# Patient Record
Sex: Male | Born: 1950 | Race: White | Hispanic: No | Marital: Married | Smoking: Former smoker
Health system: Southern US, Community
[De-identification: ages and names within clinical notes are randomized; demographics above are authoritative.]

## PROBLEM LIST (undated history)

## (undated) DIAGNOSIS — I10 Essential (primary) hypertension: Secondary | ICD-10-CM

## (undated) DIAGNOSIS — M5136 Other intervertebral disc degeneration, lumbar region: Secondary | ICD-10-CM

## (undated) DIAGNOSIS — C4490 Unspecified malignant neoplasm of skin, unspecified: Secondary | ICD-10-CM

## (undated) DIAGNOSIS — Z944 Liver transplant status: Secondary | ICD-10-CM

## (undated) DIAGNOSIS — M1990 Unspecified osteoarthritis, unspecified site: Secondary | ICD-10-CM

## (undated) DIAGNOSIS — M51369 Other intervertebral disc degeneration, lumbar region without mention of lumbar back pain or lower extremity pain: Secondary | ICD-10-CM

## (undated) HISTORY — PX: OTHER SURGICAL HISTORY: SHX169

---

## 1997-05-26 ENCOUNTER — Emergency Department (HOSPITAL_COMMUNITY): Admission: EM | Admit: 1997-05-26 | Discharge: 1997-05-26 | Payer: Self-pay | Admitting: Emergency Medicine

## 1997-08-20 ENCOUNTER — Ambulatory Visit (HOSPITAL_COMMUNITY): Admission: RE | Admit: 1997-08-20 | Discharge: 1997-08-20 | Payer: Self-pay | Admitting: Gastroenterology

## 1999-04-07 ENCOUNTER — Encounter: Payer: Self-pay | Admitting: Gastroenterology

## 1999-04-07 ENCOUNTER — Ambulatory Visit (HOSPITAL_COMMUNITY): Admission: RE | Admit: 1999-04-07 | Discharge: 1999-04-07 | Payer: Self-pay | Admitting: Gastroenterology

## 2000-08-17 ENCOUNTER — Ambulatory Visit (HOSPITAL_COMMUNITY): Admission: RE | Admit: 2000-08-17 | Discharge: 2000-08-17 | Payer: Self-pay | Admitting: Gastroenterology

## 2000-08-17 ENCOUNTER — Encounter: Payer: Self-pay | Admitting: Gastroenterology

## 2002-06-12 ENCOUNTER — Encounter: Payer: Self-pay | Admitting: Rheumatology

## 2002-06-12 ENCOUNTER — Encounter: Admission: RE | Admit: 2002-06-12 | Discharge: 2002-06-12 | Payer: Self-pay | Admitting: Rheumatology

## 2002-11-15 ENCOUNTER — Encounter: Admission: RE | Admit: 2002-11-15 | Discharge: 2002-11-15 | Payer: Self-pay | Admitting: Rheumatology

## 2002-11-15 ENCOUNTER — Encounter: Payer: Self-pay | Admitting: Rheumatology

## 2002-11-23 ENCOUNTER — Encounter: Admission: RE | Admit: 2002-11-23 | Discharge: 2002-11-23 | Payer: Self-pay | Admitting: Rheumatology

## 2002-11-23 ENCOUNTER — Encounter: Payer: Self-pay | Admitting: Rheumatology

## 2002-12-04 ENCOUNTER — Encounter: Admission: RE | Admit: 2002-12-04 | Discharge: 2002-12-04 | Payer: Self-pay | Admitting: Rheumatology

## 2003-02-07 ENCOUNTER — Encounter: Admission: RE | Admit: 2003-02-07 | Discharge: 2003-02-07 | Payer: Self-pay | Admitting: Internal Medicine

## 2006-06-12 ENCOUNTER — Inpatient Hospital Stay (HOSPITAL_COMMUNITY): Admission: EM | Admit: 2006-06-12 | Discharge: 2006-06-14 | Payer: Self-pay | Admitting: Emergency Medicine

## 2006-06-13 ENCOUNTER — Encounter (INDEPENDENT_AMBULATORY_CARE_PROVIDER_SITE_OTHER): Payer: Self-pay | Admitting: *Deleted

## 2008-01-12 IMAGING — CR DG CHEST 2V
2 series · 2 of 2 positions shown · non-contrast
Comparison: None.

CLINICAL DATA: Transient ischemic attack.  
 CHEST - 2 VIEW:
 PA and lateral chest - 06/13/06.

[w chest pa]
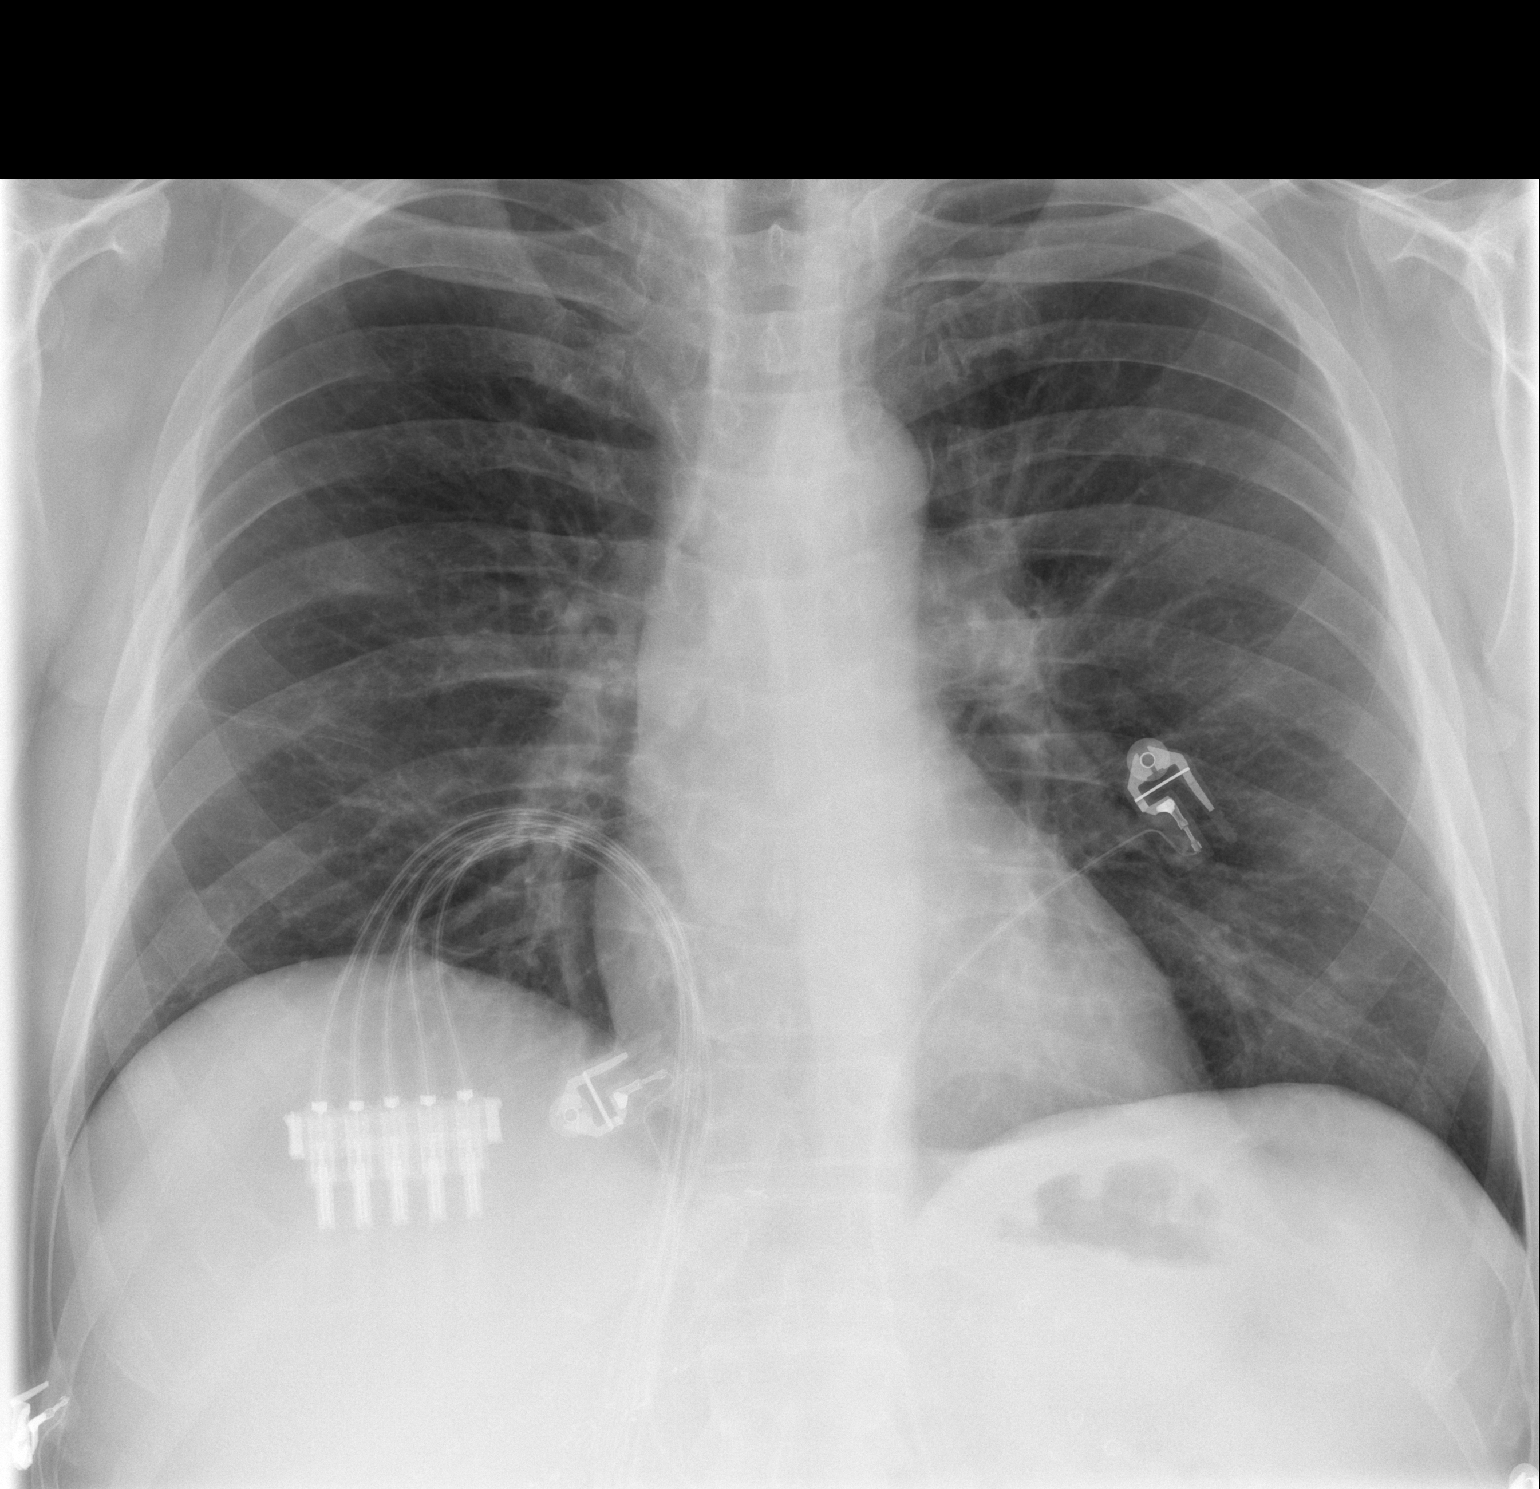

[w chest lat]
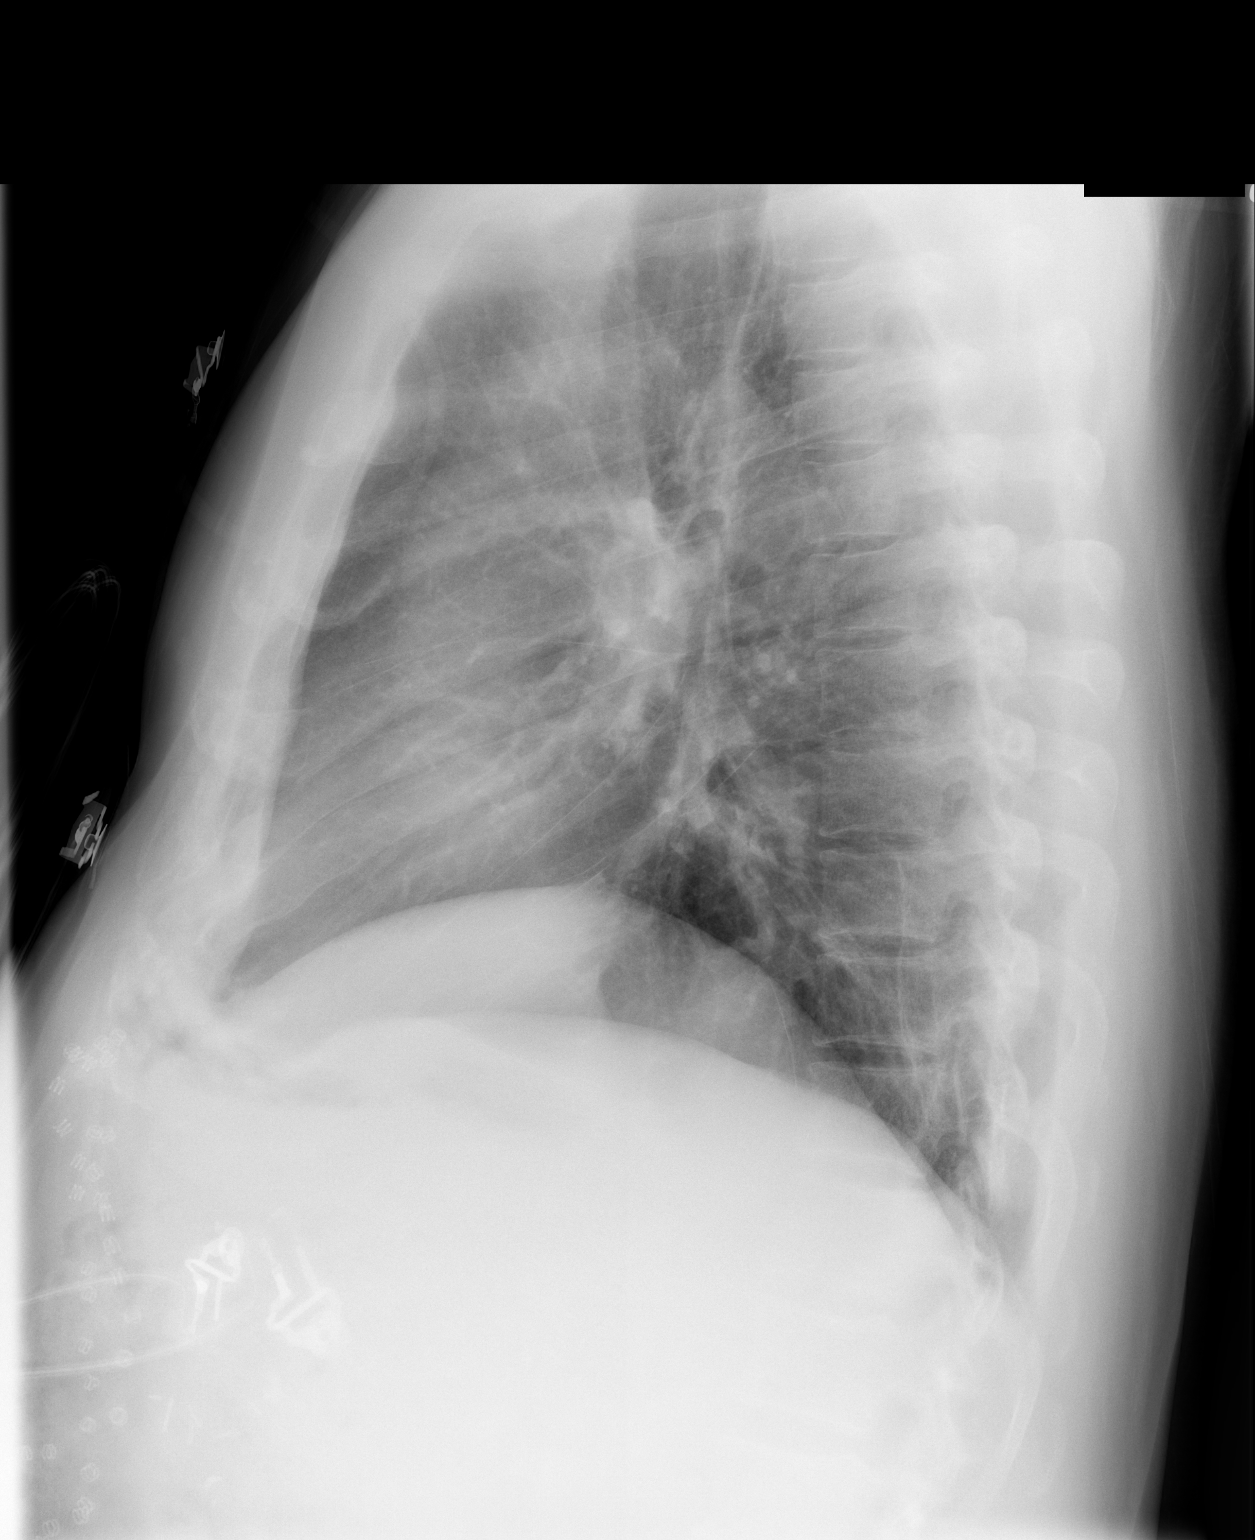

[2 of 2 positions shown; findings below may reference images not displayed]

FINDINGS: The lungs are clear.   The costophrenic angles are sharp. The cardiac and mediastinal contours are unremarkable.  No focal bony abnormality with a remote right ninth rib fracture noted.
IMPRESSION: No acute finding.

## 2010-06-15 NOTE — Discharge Summary (Signed)
NAME:  Andrew Banks, Andrew Banks             ACCOUNT NO.:  [id number]   MEDICAL RECORD NO.:  [id number]          PATIENT TYPE:  INP   LOCATION:  3735                         FACILITY:  [facility]   PHYSICIAN:  Pramod P. Pearlean Brownie, MD    DATE OF BIRTH:  September 09, 1950   DATE OF ADMISSION:  06/12/2006  DATE OF DISCHARGE:  06/14/2006                               DISCHARGE SUMMARY   DIAGNOSES:  At the time of discharge:  1. Right brain transient ischemic attack.  2. History of hepatic transplant.  The liver transplant was 6 years      ago.  3. History of Hepatitis C.  4. Cigarette smoker.  5. History of cholecystectomy.  6. Hepatitis C with end-stage liver disease, prior to liver      transplant.  7. History of left shoulder surgery.  8. History of knee surgery and elbow surgery, due to injuries during      [place] war.  9. Pyelonephritis.  10.History of hernia surgery.  11.Hypertension.   MEDICINES AT TIME OF DISCHARGE:  1. Aspirin 81 mg a day.  2. Norvasc 10 mg a day.  3. Norvasc 5 mg at bedtime.  4. Actonel 300 mg a day.  5. Neurontin 300 mg b.i.d.  6. Paxil 40 mg a day.  7. Inderal 60 mg LA a day.  8. Prevacid 30 mg b.i.d.  9. Multivitamin a day.  10.Prograf 2 mg a day, 1 mg at bedtime.  11.Cerefolin one a day.  12.Calcium plus vitamin D, one a day.   STUDIES PERFORMED:  1. EKG on admission shows normal sinus rhythm with incomplete right      bundle branch block.  A CT of the brain on admission shows no acute      intracranial abnormality.  Atrophy.  2. Chest X-Ray: Shows no acute finding.  3. MRI of the brain shows mild atrophy, no acute abnormality.  No      significant prior ischemia next.  4. MRA of the neck shows no significant carotid stenosis, question      stenosis of proximal right vertebral artery but likely artifact.  5. MRA of the head normal.  6. Carotid Doppler shows no ICA stenosis and 2-D echocardiogram shows      EF of 60% with no source of embolus.   LABORATORY  STUDIES:  Shows CBC with hematocrit 38.0, but hemoglobin  13.2, platelets 114, otherwise normal.  Chemistries with sodium 133,  glucose 108, otherwise normal.  Coagulation studies normal.  Liver  function tests with alkaline phosphate 213, otherwise normal.  Albumin  4.  Cardiac enzymes negative.  Cholesterol 135, triglycerides 143, HDL  31, LDL 75.  Urinalysis was negative.  Homocystine was 43.8.  Alcohol  level less than 5.  Hemoglobin A1c is 4.9.  Urine drug screen was  negative.   HISTORY OF PRESENT ILLNESS:  Andrew Banks is a 60 year old left  handed Caucasian male with history of hypertension and endstage liver  disease, status post liver transplant 6 years ago.  He presented to  [facility] the evening of admission, after onset of left-sided  hemi-sensory deficit that began around 6:30 p.m.  The patient initially  noticed onset of some cold sensation on left part of the face.  He felt  dizzy,  the day prior.  He also noticed onset of some white noise  hearing changes in left ear that occurred at the onset of the hemi-  sensory deficit.  The patient began having some numb sensations, cold  sensations of the left arm and the leg and may have been slightly weak  on the left side.  He denied any visual abnormalities.  He did not have  a headache but later developed a slight headache.  He has had some  improvement and left-sided deficit.  He has minimal residual, but for  the most part, a sensory deficit is improved.  CT of the brain is  unremarkable.  NIH stroke scale was 1.  He was admitted for further  stroke evaluation.  He was not a TPA candidate, secondary to rapid  improvement.   HOSPITAL COURSE:  MRI was negative for acute infarct.  It is felt that  he possibly had a right brain TIA, as no acute stroke was seen.  It was  hard to be absolutely certain.  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  The patient was advised to stop  smoking.  He was started on aspirin for stroke prevention.  He had no  neurologic deficits and was safe for return home.   CONDITION ON DISCHARGE:  The patient was alert and oriented x3.  No  aphasia.  No dysarthria.  Extraocular movements are intact.  Face is  symmetric.  He has no focal deficits.  No arm or leg weakness.  Gait is  steady.   DISCHARGE/PLAN:  1. Discharge home with family.  2. Aspirin for stroke prevention.  3. Stop smoking.  4. Cerefolin to decrease homocystine.   FOLLOW UP:  1. Dr. Wylene Simmer in one 1 week.  2. Follow up with Dr. Anne Hahn as needed.      Annie Main, N.P.    ______________________________  Sunny Schlein. Pearlean Brownie, MD    SB/MEDQ  D:  06/14/2006  T:  06/14/2006  Job:  [id number]   cc:   Pramod P. Pearlean Brownie, MD  Gaspar Garbe, M.D.

## 2010-06-15 NOTE — H&P (Signed)
NAME:  DAYSHAWN, IRIZARRY NO.:  [id number]   MEDICAL RECORD NO.:  [id number]          PATIENT TYPE:  EMS   LOCATION:  MAJO                         FACILITY:  [facility]   PHYSICIAN:  Marlan Palau, M.D.  DATE OF BIRTH:  Jan 23, 1951   DATE OF ADMISSION:  06/12/2006  DATE OF DISCHARGE:                              HISTORY & PHYSICAL   HISTORY OF PRESENT ILLNESS:  Shykeem L. Petrak is a 60 year old left-  handed white male born Apr 05, 1950 with a history of hypertension  and end-stage liver status post liver transplant 6 years ago.  This  patient comes to [facility] this evening after onset of left-  sided hemisensory deficit that began around 6:30 p.m.  The patient  initially noted onset of some cold sensation in the left part of the  face, had felt dizzy yesterday.  The patient also had noted onset of  some white noise hearing changes in the left ear that occurred at the  onset of this hemisensory deficit.  The patient began having some numb  sensations, cold sensations, of the left arm and the leg and may have  been slightly weak on that left side.  The patient denied any visual  field changes initially.  He did not have a headache but later developed  a slight headache.  The patient has had improvement in the deficit.  Has  minimal residual but for the most part the sensory deficit is improved.  CT scan of the head has been done and is unremarkable.  Neurology is  called as a code stroke for evaluation.  NIH Stroke Scale score is 1.   PAST MEDICAL HISTORY:  1. New onset of left hemisensory deficit.  2. Status post liver transplant 6 years ago.  3. Gallbladder resection in the past.  4. Hepatitic C with end-stage liver prior to the liver transplant.  5. Left shoulder surgery.  6. Knee surgery and elbow surgery due to injuries during the [place]      War.  7. Pyelonephritis.  8. Hernia surgery.  9. Hypertension.   MEDICATIONS:  1. Tacrolimus 1 mg  tablet, 2 in the morning, 1 in the evening.  2. Norvasc 10 mg tablets, 1 in the morning, 1/2 in the evening.  3. Ursodiol 300 mg daily.  4. Neurontin 300 mg b.i.d.  5. Paxil 40 mg daily.  6. Prevacid 30 mg b.i.d.  7. Calcium supplementation.  8. Multivitamins.  9. Inderal 60 mg LA tablet daily.   ALLERGIES:  The patient has no known allergies.   SOCIAL HISTORY:  Smokes 1/4 pack of cigarettes daily and does not drink  alcohol.  This patient is married.  Lives in the [place], [place] area.  He is retired.  The patient has 3 children who are alive  and well.   FAMILY MEDICAL HISTORY:  The mother is alive and has a history of breast  cancer.  Father is alive, has history of hypertension, prostate cancer,  glaucoma.  One sister who is in good health with the exception of  obesity.   REVIEW  OF SYSTEMS:  Notable for no recent fevers or chills.  The patient  had some slight dizziness over the last day or so.  Slight shortness of  breath for several days.  Denies any chest pain, abdominal pain, nausea,  vomiting, gait disturbance, troubles controlling the bowels or bladder,  blacking out episodes.   PHYSICAL EXAMINATION:  VITAL SIGNS:  Blood pressure is 146/85, heart  rate is 67, respiratory rate 20, temperature afebrile.  GENERAL:  This patient is a fairly well-developed white male who is  alert and cooperative at the time of examination.  HEENT:  Head is atraumatic.  Eyes - pupils equal, round and reactive to  light.  Discs are flat bilaterally.  NECK:  Supple.  No carotid bruits noted.  RESPIRATORY:  Examination is clear.  CARDIOVASCULAR:  Examination reveals a regular rate and rhythm.  No  obvious murmurs or rubs noted.  ABDOMEN:  Reveals positive bowel sounds.  No organomegaly or tenderness  noted.  EXTREMITIES:  Without significant edema.  NEUROLOGICAL:  Cranial nerves as above.  Facial symmetry is present.  The patient has good sensation of the face to pinprick,  soft touch  bilaterally.  He has good strength in the facial muscles and the muscles  of the head turn and shoulders bilaterally.  Speech is well enunciated  and not aphasic.  Extraocular movements are full.  Visual fields are  full.  No aphasia noted.  Motor testing reveals 5/5 strength in all  fours.  Good symmetric motor tone is noted throughout.  Sensory testing  is intact to pinprick, soft touch and vibrate sensation throughout with  the exception of some decrease in pinprick sensation of the left foot as  compared to the right.  The patient has good finger-nose-finger, heel-to-  shin.  Gait was not tested.  No drift is seen in the arms or legs.  No  evidence of extinction.  Simultaneous stimulation was noted.  The  patient has good symmetric reflexes throughout.  Toes are neutral  bilaterally.   LABORATORY DATA:  As noted before, an INR of 1.0, sodium 133, potassium  3.9, chloride of 102, CO2 22, glucose of 108, BUN of 14, creatinine  0.98, total bili 1.0, alkaline phosphatase of 213, SGOT of 26, SGPT of  21, total protein of 6.7, albumin of 4.0, calcium 9.1.  White count of  5.7, hemoglobin of 13.2, hematocrit of 38.0, MCV of 90.4, platelets of  114.  CT scan of the head is as above.   IMPRESSION:  Onset of left hemisensory deficit.   The patient has NIH Stroke Scale score of 1.  The patient is not a TPA  candidate due to minimal deficit.  We will admit this patient at this  point for evaluation to rule out cerebrovascular disease.   PLAN:  1. Admission to [facility].  2. Aspirin therapy.  3. MRI of the brain.  4. MRI angiogram of the intracranial and extracranial vessels.  5. 2D echocardiogram.  6. Follow up as his course follows.      Marlan Palau, M.D.  Electronically Signed     CKW/MEDQ  D:  06/12/2006  T:  06/13/2006  Job:  [id number]   cc:   Gaspar Garbe, M.D.  [facility]

## 2014-08-27 DIAGNOSIS — B1920 Unspecified viral hepatitis C without hepatic coma: Secondary | ICD-10-CM | POA: Diagnosis not present

## 2014-08-27 DIAGNOSIS — K769 Liver disease, unspecified: Secondary | ICD-10-CM | POA: Diagnosis not present

## 2014-08-27 DIAGNOSIS — R609 Edema, unspecified: Secondary | ICD-10-CM | POA: Diagnosis not present

## 2014-08-27 DIAGNOSIS — Z79899 Other long term (current) drug therapy: Secondary | ICD-10-CM | POA: Diagnosis not present

## 2014-08-27 DIAGNOSIS — R918 Other nonspecific abnormal finding of lung field: Secondary | ICD-10-CM | POA: Diagnosis not present

## 2014-08-27 DIAGNOSIS — R161 Splenomegaly, not elsewhere classified: Secondary | ICD-10-CM | POA: Diagnosis not present

## 2014-08-27 DIAGNOSIS — Z418 Encounter for other procedures for purposes other than remedying health state: Secondary | ICD-10-CM | POA: Diagnosis not present

## 2014-08-27 DIAGNOSIS — Z114 Encounter for screening for human immunodeficiency virus [HIV]: Secondary | ICD-10-CM | POA: Diagnosis not present

## 2014-08-27 DIAGNOSIS — Z981 Arthrodesis status: Secondary | ICD-10-CM | POA: Diagnosis not present

## 2014-08-27 DIAGNOSIS — K766 Portal hypertension: Secondary | ICD-10-CM | POA: Diagnosis not present

## 2014-08-27 DIAGNOSIS — Z4823 Encounter for aftercare following liver transplant: Secondary | ICD-10-CM | POA: Diagnosis not present

## 2014-08-27 DIAGNOSIS — R634 Abnormal weight loss: Secondary | ICD-10-CM | POA: Diagnosis not present

## 2014-08-27 DIAGNOSIS — I10 Essential (primary) hypertension: Secondary | ICD-10-CM | POA: Diagnosis not present

## 2014-08-27 DIAGNOSIS — N281 Cyst of kidney, acquired: Secondary | ICD-10-CM | POA: Diagnosis not present

## 2014-08-27 DIAGNOSIS — Z9049 Acquired absence of other specified parts of digestive tract: Secondary | ICD-10-CM | POA: Diagnosis not present

## 2014-08-27 DIAGNOSIS — Z944 Liver transplant status: Secondary | ICD-10-CM | POA: Diagnosis not present

## 2014-09-10 DIAGNOSIS — R938 Abnormal findings on diagnostic imaging of other specified body structures: Secondary | ICD-10-CM | POA: Diagnosis not present

## 2014-09-10 DIAGNOSIS — R918 Other nonspecific abnormal finding of lung field: Secondary | ICD-10-CM | POA: Diagnosis not present

## 2014-09-10 DIAGNOSIS — R590 Localized enlarged lymph nodes: Secondary | ICD-10-CM | POA: Diagnosis not present

## 2014-09-10 DIAGNOSIS — Z944 Liver transplant status: Secondary | ICD-10-CM | POA: Diagnosis not present

## 2014-10-14 DIAGNOSIS — K766 Portal hypertension: Secondary | ICD-10-CM | POA: Diagnosis not present

## 2014-10-14 DIAGNOSIS — Z79899 Other long term (current) drug therapy: Secondary | ICD-10-CM | POA: Diagnosis not present

## 2014-10-14 DIAGNOSIS — Z888 Allergy status to other drugs, medicaments and biological substances status: Secondary | ICD-10-CM | POA: Diagnosis not present

## 2014-10-14 DIAGNOSIS — Z944 Liver transplant status: Secondary | ICD-10-CM | POA: Diagnosis not present

## 2014-10-14 DIAGNOSIS — K219 Gastro-esophageal reflux disease without esophagitis: Secondary | ICD-10-CM | POA: Diagnosis not present

## 2014-10-14 DIAGNOSIS — K3189 Other diseases of stomach and duodenum: Secondary | ICD-10-CM | POA: Diagnosis not present

## 2014-10-14 DIAGNOSIS — I10 Essential (primary) hypertension: Secondary | ICD-10-CM | POA: Diagnosis not present

## 2014-10-14 DIAGNOSIS — Z79891 Long term (current) use of opiate analgesic: Secondary | ICD-10-CM | POA: Diagnosis not present

## 2015-03-22 DIAGNOSIS — J40 Bronchitis, not specified as acute or chronic: Secondary | ICD-10-CM | POA: Diagnosis not present

## 2015-03-22 DIAGNOSIS — R05 Cough: Secondary | ICD-10-CM | POA: Diagnosis not present

## 2015-03-22 DIAGNOSIS — R062 Wheezing: Secondary | ICD-10-CM | POA: Diagnosis not present

## 2015-03-24 DIAGNOSIS — R062 Wheezing: Secondary | ICD-10-CM | POA: Diagnosis not present

## 2015-03-24 DIAGNOSIS — R05 Cough: Secondary | ICD-10-CM | POA: Diagnosis not present

## 2015-03-24 DIAGNOSIS — J40 Bronchitis, not specified as acute or chronic: Secondary | ICD-10-CM | POA: Diagnosis not present

## 2015-03-26 DIAGNOSIS — Z886 Allergy status to analgesic agent status: Secondary | ICD-10-CM | POA: Diagnosis not present

## 2015-03-26 DIAGNOSIS — R0600 Dyspnea, unspecified: Secondary | ICD-10-CM | POA: Diagnosis not present

## 2015-03-26 DIAGNOSIS — R062 Wheezing: Secondary | ICD-10-CM | POA: Diagnosis not present

## 2015-03-26 DIAGNOSIS — R05 Cough: Secondary | ICD-10-CM | POA: Diagnosis not present

## 2015-03-26 DIAGNOSIS — R0789 Other chest pain: Secondary | ICD-10-CM | POA: Diagnosis not present

## 2015-03-26 DIAGNOSIS — R51 Headache: Secondary | ICD-10-CM | POA: Diagnosis not present

## 2015-03-26 DIAGNOSIS — Z87891 Personal history of nicotine dependence: Secondary | ICD-10-CM | POA: Diagnosis not present

## 2015-03-26 DIAGNOSIS — Z944 Liver transplant status: Secondary | ICD-10-CM | POA: Diagnosis not present

## 2015-03-26 DIAGNOSIS — Z79899 Other long term (current) drug therapy: Secondary | ICD-10-CM | POA: Diagnosis not present

## 2015-03-26 DIAGNOSIS — R531 Weakness: Secondary | ICD-10-CM | POA: Diagnosis not present

## 2015-03-26 DIAGNOSIS — R0989 Other specified symptoms and signs involving the circulatory and respiratory systems: Secondary | ICD-10-CM | POA: Diagnosis not present

## 2015-03-26 DIAGNOSIS — R5383 Other fatigue: Secondary | ICD-10-CM | POA: Diagnosis not present

## 2015-03-26 DIAGNOSIS — R0602 Shortness of breath: Secondary | ICD-10-CM | POA: Diagnosis not present

## 2015-07-16 DIAGNOSIS — J40 Bronchitis, not specified as acute or chronic: Secondary | ICD-10-CM | POA: Diagnosis not present

## 2015-08-12 DIAGNOSIS — Z944 Liver transplant status: Secondary | ICD-10-CM | POA: Diagnosis not present

## 2015-08-12 DIAGNOSIS — Z4823 Encounter for aftercare following liver transplant: Secondary | ICD-10-CM | POA: Diagnosis not present

## 2015-08-12 DIAGNOSIS — Z79899 Other long term (current) drug therapy: Secondary | ICD-10-CM | POA: Diagnosis not present

## 2015-11-30 ENCOUNTER — Emergency Department (HOSPITAL_BASED_OUTPATIENT_CLINIC_OR_DEPARTMENT_OTHER)
Admission: EM | Admit: 2015-11-30 | Discharge: 2015-11-30 | Disposition: A | Discharge status: Home / Self Care | Payer: Medicare Other | Attending: Emergency Medicine | Admitting: Emergency Medicine

## 2015-11-30 ENCOUNTER — Encounter (HOSPITAL_BASED_OUTPATIENT_CLINIC_OR_DEPARTMENT_OTHER): Payer: Self-pay | Admitting: *Deleted

## 2015-11-30 DIAGNOSIS — I10 Essential (primary) hypertension: Secondary | ICD-10-CM | POA: Insufficient documentation

## 2015-11-30 DIAGNOSIS — Z79899 Other long term (current) drug therapy: Secondary | ICD-10-CM | POA: Diagnosis not present

## 2015-11-30 DIAGNOSIS — S4992XA Unspecified injury of left shoulder and upper arm, initial encounter: Secondary | ICD-10-CM | POA: Diagnosis present

## 2015-11-30 DIAGNOSIS — Y92009 Unspecified place in unspecified non-institutional (private) residence as the place of occurrence of the external cause: Secondary | ICD-10-CM | POA: Insufficient documentation

## 2015-11-30 DIAGNOSIS — Z87891 Personal history of nicotine dependence: Secondary | ICD-10-CM | POA: Insufficient documentation

## 2015-11-30 DIAGNOSIS — Y939 Activity, unspecified: Secondary | ICD-10-CM | POA: Diagnosis not present

## 2015-11-30 DIAGNOSIS — S41112A Laceration without foreign body of left upper arm, initial encounter: Secondary | ICD-10-CM | POA: Diagnosis not present

## 2015-11-30 DIAGNOSIS — W01190A Fall on same level from slipping, tripping and stumbling with subsequent striking against furniture, initial encounter: Secondary | ICD-10-CM | POA: Diagnosis not present

## 2015-11-30 DIAGNOSIS — Z85828 Personal history of other malignant neoplasm of skin: Secondary | ICD-10-CM | POA: Insufficient documentation

## 2015-11-30 DIAGNOSIS — Y999 Unspecified external cause status: Secondary | ICD-10-CM | POA: Diagnosis not present

## 2015-11-30 HISTORY — DX: Essential (primary) hypertension: I10

## 2015-11-30 HISTORY — DX: Unspecified osteoarthritis, unspecified site: M19.90

## 2015-11-30 HISTORY — DX: Unspecified malignant neoplasm of skin, unspecified: C44.90

## 2015-11-30 HISTORY — DX: Liver transplant status: Z94.4

## 2015-11-30 HISTORY — DX: Other intervertebral disc degeneration, lumbar region: M51.36

## 2015-11-30 HISTORY — DX: Other intervertebral disc degeneration, lumbar region without mention of lumbar back pain or lower extremity pain: M51.369

## 2015-11-30 NOTE — ED Triage Notes (Signed)
Pt reports tripping over his deaf dog this morning, his arm caught the carpeting on the stairs, large skin tear noted to let upper arm, small amount of active oozing noted, dsd applied by this rn. Pt denies any head injury or other c/o.

## 2015-11-30 NOTE — ED Provider Notes (Signed)
[name] DEPT [facility] Provider Note   CSN: [id number] Arrival date & time: 11/30/15  [id number]     History   Chief Complaint Chief Complaint  Patient presents with  . Fall  . Skin Problem    HPI Andrew Banks is a 65 y.o. male.  HPI Patient tripped on his dog going down several carpeted stairs. His left arm got abraded in the fall. There was slight bleeding. At home, he and his wife applied some ointment and a dressing. He denies any other associated injuries. Past Medical History:  Diagnosis Date  . Arthritis   . Degenerative disc disease, lumbar   . Hypertension   . Liver transplanted ([place])   . Skin cancer     There are no active problems to display for this patient.   Past Surgical History:  Procedure Laterality Date  . liver transplant         Home Medications    Prior to Admission medications   Medication Sig Start Date End Date Taking? Authorizing Provider  FELODIPINE ER PO Take by mouth.   Yes Historical Provider, MD  furosemide (LASIX) 20 MG tablet Take 20 mg by mouth.   Yes Historical Provider, MD  lactulose (CHRONULAC) 10 GM/15ML solution Take by mouth 3 (three) times daily.   Yes Historical Provider, MD  lansoprazole (PREVACID) 30 MG capsule Take 30 mg by mouth daily at 12 noon.   Yes Historical Provider, MD  MORPHINE SULFATE ER PO Take by mouth.   Yes Historical Provider, MD  PARoxetine (PAXIL) 20 MG tablet Take 20 mg by mouth daily.   Yes Historical Provider, MD  propantheline (PROBANTHINE) 15 MG tablet Take 15 mg by mouth 3 (three) times daily with meals.   Yes Historical Provider, MD  propranolol (INDERAL) 10 MG tablet Take 10 mg by mouth 3 (three) times daily.   Yes Historical Provider, MD  rifaximin (XIFAXAN) 550 MG TABS tablet Take 550 mg by mouth.   Yes Historical Provider, MD  tacrolimus (PROGRAF) 1 MG capsule Take 1 mg by mouth 2 (two) times daily.   Yes Historical Provider, MD  tacrolimus (PROTOPIC) 0.03 % ointment Apply topically 2 (two)  times daily.   Yes Historical Provider, MD  ursodiol (ACTIGALL) 250 MG tablet Take 250 mg by mouth 3 (three) times daily.   Yes Historical Provider, MD    Family History History reviewed. No pertinent family history.  Social History Social History  Substance Use Topics  . Smoking status: Former Research scientist (life sciences)  . Smokeless tobacco: Never Used  . Alcohol use No     Allergies   Review of patient's allergies indicates no known allergies.   Review of Systems Review of Systems Constitutional: No recent fevers or chills Neurologic: No headache, head injury, weakness numbness or tingling  Physical Exam Updated Vital Signs There were no vitals taken for this visit.  Physical Exam  Constitutional: He is oriented to person, place, and time. He appears well-developed and well-nourished. No distress.  HENT:  Head: Normocephalic and atraumatic.  Eyes: EOM are normal.  Pulmonary/Chest: Effort normal.  Musculoskeletal: Normal range of motion.  Normal range of motion of the left upper extremity without deformity. Patient does have a skin tear to the posterior upper arm. This is approximately 8 x 6 cm. No active bleeding.  Neurological: He is alert and oriented to person, place, and time. Coordination normal.  Skin: Skin is warm and dry.     ED Treatments / Results  Labs (all labs ordered  are listed, but only abnormal results are displayed) Labs Reviewed - No data to display  EKG  EKG Interpretation None       Radiology No results found.  Procedures Procedures (including critical care time) Wound care: Wound was cleansed with wound cleanser and normal saline. The skin layer was realigned. Mepitel dressing placed. Medications Ordered in ED Medications - No data to display   Initial Impression / Assessment and Plan / ED Course  I have reviewed the triage vital signs and the nursing notes.  Pertinent labs & imaging results that were available during my care of the patient were  reviewed by me and considered in my medical decision making (see chart for details).  Clinical Course    Final Clinical Impressions(s) / ED Diagnoses   Final diagnoses:  Skin tear of left upper arm without complication, initial encounter  Skin tear is no other associated injuries. Wound management as outlined.   New Prescriptions New Prescriptions   No medications on file     Charlesetta Shanks, MD 11/30/15 [phone]

## 2015-11-30 NOTE — ED Notes (Signed)
Pt given d/c resource guide for f/u

## 2015-12-21 DIAGNOSIS — H40022 Open angle with borderline findings, high risk, left eye: Secondary | ICD-10-CM | POA: Diagnosis not present

## 2016-02-15 DIAGNOSIS — G9340 Encephalopathy, unspecified: Secondary | ICD-10-CM | POA: Diagnosis not present

## 2016-02-15 DIAGNOSIS — I358 Other nonrheumatic aortic valve disorders: Secondary | ICD-10-CM | POA: Diagnosis not present

## 2016-02-15 DIAGNOSIS — Z8739 Personal history of other diseases of the musculoskeletal system and connective tissue: Secondary | ICD-10-CM | POA: Diagnosis not present

## 2016-02-15 DIAGNOSIS — I10 Essential (primary) hypertension: Secondary | ICD-10-CM | POA: Diagnosis not present

## 2016-02-15 DIAGNOSIS — Z23 Encounter for immunization: Secondary | ICD-10-CM | POA: Diagnosis not present

## 2016-02-15 DIAGNOSIS — J449 Chronic obstructive pulmonary disease, unspecified: Secondary | ICD-10-CM | POA: Diagnosis not present

## 2016-02-15 DIAGNOSIS — R748 Abnormal levels of other serum enzymes: Secondary | ICD-10-CM | POA: Diagnosis not present

## 2016-02-15 DIAGNOSIS — I110 Hypertensive heart disease with heart failure: Secondary | ICD-10-CM | POA: Diagnosis not present

## 2016-02-15 DIAGNOSIS — M549 Dorsalgia, unspecified: Secondary | ICD-10-CM | POA: Diagnosis not present

## 2016-02-15 DIAGNOSIS — E871 Hypo-osmolality and hyponatremia: Secondary | ICD-10-CM | POA: Diagnosis not present

## 2016-02-15 DIAGNOSIS — I5030 Unspecified diastolic (congestive) heart failure: Secondary | ICD-10-CM | POA: Diagnosis not present

## 2016-02-15 DIAGNOSIS — Z87891 Personal history of nicotine dependence: Secondary | ICD-10-CM | POA: Diagnosis not present

## 2016-02-15 DIAGNOSIS — Z79899 Other long term (current) drug therapy: Secondary | ICD-10-CM | POA: Diagnosis not present

## 2016-02-15 DIAGNOSIS — R092 Respiratory arrest: Secondary | ICD-10-CM | POA: Diagnosis not present

## 2016-02-15 DIAGNOSIS — K7150 Toxic liver disease with chronic active hepatitis without ascites: Secondary | ICD-10-CM | POA: Diagnosis not present

## 2016-02-15 DIAGNOSIS — R55 Syncope and collapse: Secondary | ICD-10-CM | POA: Diagnosis not present

## 2016-02-15 DIAGNOSIS — R9431 Abnormal electrocardiogram [ECG] [EKG]: Secondary | ICD-10-CM | POA: Diagnosis not present

## 2016-02-15 DIAGNOSIS — E876 Hypokalemia: Secondary | ICD-10-CM | POA: Diagnosis not present

## 2016-02-15 DIAGNOSIS — G894 Chronic pain syndrome: Secondary | ICD-10-CM | POA: Diagnosis not present

## 2016-02-15 DIAGNOSIS — I119 Hypertensive heart disease without heart failure: Secondary | ICD-10-CM | POA: Diagnosis not present

## 2016-02-15 DIAGNOSIS — J8410 Pulmonary fibrosis, unspecified: Secondary | ICD-10-CM | POA: Diagnosis not present

## 2016-02-15 DIAGNOSIS — J441 Chronic obstructive pulmonary disease with (acute) exacerbation: Secondary | ICD-10-CM | POA: Diagnosis not present

## 2016-02-15 DIAGNOSIS — K769 Liver disease, unspecified: Secondary | ICD-10-CM | POA: Diagnosis not present

## 2016-02-15 DIAGNOSIS — J9601 Acute respiratory failure with hypoxia: Secondary | ICD-10-CM | POA: Diagnosis not present

## 2016-02-15 DIAGNOSIS — R079 Chest pain, unspecified: Secondary | ICD-10-CM | POA: Diagnosis not present

## 2016-02-15 DIAGNOSIS — R0602 Shortness of breath: Secondary | ICD-10-CM | POA: Diagnosis not present

## 2016-02-15 DIAGNOSIS — I249 Acute ischemic heart disease, unspecified: Secondary | ICD-10-CM | POA: Diagnosis not present

## 2016-02-15 DIAGNOSIS — J90 Pleural effusion, not elsewhere classified: Secondary | ICD-10-CM | POA: Diagnosis not present

## 2016-02-15 DIAGNOSIS — I21A1 Myocardial infarction type 2: Secondary | ICD-10-CM | POA: Diagnosis not present

## 2016-02-15 DIAGNOSIS — I700 Atherosclerosis of aorta: Secondary | ICD-10-CM | POA: Diagnosis not present

## 2016-02-15 DIAGNOSIS — J9600 Acute respiratory failure, unspecified whether with hypoxia or hypercapnia: Secondary | ICD-10-CM | POA: Diagnosis not present

## 2016-02-15 DIAGNOSIS — Z944 Liver transplant status: Secondary | ICD-10-CM | POA: Diagnosis not present

## 2016-02-15 DIAGNOSIS — D72829 Elevated white blood cell count, unspecified: Secondary | ICD-10-CM | POA: Diagnosis not present

## 2016-02-15 DIAGNOSIS — Z8619 Personal history of other infectious and parasitic diseases: Secondary | ICD-10-CM | POA: Diagnosis not present

## 2016-02-15 DIAGNOSIS — A419 Sepsis, unspecified organism: Secondary | ICD-10-CM | POA: Diagnosis not present

## 2016-02-15 DIAGNOSIS — J9602 Acute respiratory failure with hypercapnia: Secondary | ICD-10-CM | POA: Diagnosis not present

## 2016-02-15 DIAGNOSIS — I4510 Unspecified right bundle-branch block: Secondary | ICD-10-CM | POA: Diagnosis not present

## 2016-03-22 DIAGNOSIS — J449 Chronic obstructive pulmonary disease, unspecified: Secondary | ICD-10-CM | POA: Diagnosis not present

## 2016-04-19 DIAGNOSIS — J449 Chronic obstructive pulmonary disease, unspecified: Secondary | ICD-10-CM | POA: Diagnosis not present

## 2016-05-20 DIAGNOSIS — J449 Chronic obstructive pulmonary disease, unspecified: Secondary | ICD-10-CM | POA: Diagnosis not present

## 2016-06-19 DIAGNOSIS — J449 Chronic obstructive pulmonary disease, unspecified: Secondary | ICD-10-CM | POA: Diagnosis not present

## 2016-07-20 DIAGNOSIS — J449 Chronic obstructive pulmonary disease, unspecified: Secondary | ICD-10-CM | POA: Diagnosis not present

## 2016-08-19 DIAGNOSIS — J449 Chronic obstructive pulmonary disease, unspecified: Secondary | ICD-10-CM | POA: Diagnosis not present

## 2016-09-07 DIAGNOSIS — Z85820 Personal history of malignant melanoma of skin: Secondary | ICD-10-CM | POA: Diagnosis not present

## 2016-09-07 DIAGNOSIS — R092 Respiratory arrest: Secondary | ICD-10-CM | POA: Diagnosis not present

## 2016-09-07 DIAGNOSIS — K219 Gastro-esophageal reflux disease without esophagitis: Secondary | ICD-10-CM | POA: Diagnosis not present

## 2016-09-07 DIAGNOSIS — Z8709 Personal history of other diseases of the respiratory system: Secondary | ICD-10-CM | POA: Diagnosis not present

## 2016-09-07 DIAGNOSIS — Z85828 Personal history of other malignant neoplasm of skin: Secondary | ICD-10-CM | POA: Diagnosis not present

## 2016-09-07 DIAGNOSIS — M810 Age-related osteoporosis without current pathological fracture: Secondary | ICD-10-CM | POA: Diagnosis not present

## 2016-09-07 DIAGNOSIS — Z4823 Encounter for aftercare following liver transplant: Secondary | ICD-10-CM | POA: Diagnosis not present

## 2016-09-07 DIAGNOSIS — I10 Essential (primary) hypertension: Secondary | ICD-10-CM | POA: Diagnosis not present

## 2016-09-07 DIAGNOSIS — M519 Unspecified thoracic, thoracolumbar and lumbosacral intervertebral disc disorder: Secondary | ICD-10-CM | POA: Diagnosis not present

## 2016-09-07 DIAGNOSIS — Z8619 Personal history of other infectious and parasitic diseases: Secondary | ICD-10-CM | POA: Diagnosis not present

## 2016-09-07 DIAGNOSIS — Z79899 Other long term (current) drug therapy: Secondary | ICD-10-CM | POA: Diagnosis not present

## 2016-09-07 DIAGNOSIS — Z8719 Personal history of other diseases of the digestive system: Secondary | ICD-10-CM | POA: Diagnosis not present

## 2016-09-07 DIAGNOSIS — J449 Chronic obstructive pulmonary disease, unspecified: Secondary | ICD-10-CM | POA: Diagnosis not present

## 2016-09-19 DIAGNOSIS — J449 Chronic obstructive pulmonary disease, unspecified: Secondary | ICD-10-CM | POA: Diagnosis not present

## 2016-10-20 DIAGNOSIS — J449 Chronic obstructive pulmonary disease, unspecified: Secondary | ICD-10-CM | POA: Diagnosis not present

## 2016-11-19 DIAGNOSIS — J449 Chronic obstructive pulmonary disease, unspecified: Secondary | ICD-10-CM | POA: Diagnosis not present

## 2020-06-02 ENCOUNTER — Other Ambulatory Visit: Payer: Self-pay

## 2020-06-02 ENCOUNTER — Encounter: Payer: Self-pay | Admitting: Physical Therapy

## 2020-06-02 ENCOUNTER — Ambulatory Visit: Payer: No Typology Code available for payment source | Attending: General Practice | Admitting: Physical Therapy

## 2020-06-02 DIAGNOSIS — M6281 Muscle weakness (generalized): Secondary | ICD-10-CM

## 2020-06-02 DIAGNOSIS — M5442 Lumbago with sciatica, left side: Secondary | ICD-10-CM | POA: Diagnosis present

## 2020-06-02 DIAGNOSIS — M25562 Pain in left knee: Secondary | ICD-10-CM | POA: Insufficient documentation

## 2020-06-02 DIAGNOSIS — R262 Difficulty in walking, not elsewhere classified: Secondary | ICD-10-CM

## 2020-06-02 DIAGNOSIS — G8929 Other chronic pain: Secondary | ICD-10-CM | POA: Diagnosis present

## 2020-06-02 DIAGNOSIS — R2689 Other abnormalities of gait and mobility: Secondary | ICD-10-CM
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
## 2020-06-09 ENCOUNTER — Ambulatory Visit: Payer: No Typology Code available for payment source

## 2020-06-10 ENCOUNTER — Ambulatory Visit: Payer: No Typology Code available for payment source | Admitting: Physical Therapy

## 2020-06-15 ENCOUNTER — Other Ambulatory Visit: Payer: Self-pay

## 2020-06-15 ENCOUNTER — Ambulatory Visit: Payer: No Typology Code available for payment source | Admitting: Physical Therapy

## 2020-06-15 ENCOUNTER — Encounter: Payer: Self-pay | Admitting: Physical Therapy

## 2020-06-15 DIAGNOSIS — M25562 Pain in left knee: Secondary | ICD-10-CM

## 2020-06-15 DIAGNOSIS — M6281 Muscle weakness (generalized): Secondary | ICD-10-CM | POA: Diagnosis not present

## 2020-06-15 DIAGNOSIS — G8929 Other chronic pain: Secondary | ICD-10-CM

## 2020-06-15 DIAGNOSIS — R262 Difficulty in walking, not elsewhere classified: Secondary | ICD-10-CM

## 2020-06-15 DIAGNOSIS — R2689 Other abnormalities of gait and mobility: Secondary | ICD-10-CM

## 2020-06-15 NOTE — Therapy (Signed)
[facility] [address]  [place], [place], [address] Phone: [phone]   Fax:  [phone]  Physical Therapy Treatment  Patient Details  Name: Andrew Banks MRN: [id number] Date of Birth: Dec 02, 1950 Referring Provider (PT): Lowella Dandy, MD   Encounter Date: 06/15/2020   PT End of Session - 06/15/20 1530    Visit Number 2    Number of Visits 17    Date for PT Re-Evaluation 07/28/20    Authorization Type Medicare & VA    Authorization - Visit Number 2    Authorization - Number of Visits 15    PT Start Time 1440    PT Stop Time 1530    PT Time Calculation (min) 50 min    Activity Tolerance Patient tolerated treatment well    Behavior During Therapy [facility] for tasks assessed/performed           Past Medical History:  Diagnosis Date  . Arthritis   . Degenerative disc disease, lumbar   . Hypertension   . 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 Skin cancer     Past Surgical History:  Procedure Laterality Date  . liver transplant      There were no vitals filed for this visit.   Subjective Assessment - 06/15/20 1442    Subjective Has had other appointments lately- been getting workup for L foot drop and LE pain with Neurology. Has been walking with his SPC for up to 20 min.    Pertinent History skin CA, liver transplant, HTN, lumbar DDD    Diagnostic tests none recent    Patient Stated Goals decrease pain    Currently in Pain? Yes    Pain Score 6     Pain Location Back    Pain Orientation Lower    Pain Descriptors / Indicators Dull;Constant    Pain Radiating Towards L LE to foot                             [facility] Adult PT Treatment/Exercise - 06/15/20 0001      Ambulation/Gait   Ambulation Distance (Feet) 90 Feet    Gait Pattern Step-through pattern;Step-to pattern;Decreased step length - right;Decreased step length - left;Decreased dorsiflexion - left;Decreased weight shift to  left;Right flexed knee in stance;Left flexed knee in stance;Trunk flexed    Ambulation Surface Level;Indoor    Gait velocity decreased    Gait Comments after 4WW height adjusted- still with anterior trunk lean but improved      Exercises   Exercises Knee/Hip;Lumbar;Ankle      Lumbar Exercises: Aerobic   Nustep L3 x 6 min (UEs/LEs)      Knee/Hip Exercises: Seated   Long Arc Quad Strengthening;Left;1 set;10 reps    Long Arc Quad Limitations yellow loop around ankles   sitting in chair with back rest   Hamstring Curl Strengthening;Left;1 set;10 reps    Hamstring Limitations red TB   cues to decrease speed   Sit to Sand 1 set;10 reps;without UE support   cueing for set up and encouraging anterior trunk lean, reaching for target     Ankle Exercises: Standing   Heel Raises Both;10 reps    Heel Raises Limitations heavy UE support required      Ankle Exercises: Seated   Towel Inversion/Eversion Limitations x15 towel slides; AROM inversion, eversion AAROM    Heel Raises Both;15 reps    Heel Raises Limitations  more success than standing                  PT Education - 06/15/20 1529    Education Details update to HEP; discussion on benefits of aquatic therapy    Person(s) Educated Patient    Methods Explanation;Demonstration;Tactile cues;Verbal cues;Handout    Comprehension Verbalized understanding;Returned demonstration            PT Short Term Goals - 06/15/20 1532      PT SHORT TERM GOAL #1   Title Patient to be independent with initial HEP.    Time 3    Period Weeks    Status On-going    Target Date 06/23/20             PT Long Term Goals - 06/15/20 1532      PT LONG TERM GOAL #1   Title Patient to be independent with advanced HEP.    Time 8    Period Weeks    Status On-going      PT LONG TERM GOAL #2   Title Patient to demonstrate B LE strength >/=4/5.    Time 8    Period Weeks    Status On-going      PT LONG TERM GOAL #3   Title Patient to score  <15 sec on 5xSTS in order to decrease risk of falls.    Time 8    Period Weeks    Status On-going      PT LONG TERM GOAL #4   Title Patient to score <14 sec on TUG with LRAD in order to decrease risk of falls.    Time 8    Period Weeks    Status On-going      PT LONG TERM GOAL #5   Title Patient to score atleast 45/56 on BERG in order to decrease risk of falls.    Time 8    Period Weeks    Status On-going                 Plan - 06/15/20 1530    Clinical Impression Statement Patient arrived to session with report of getting workup for L foot drop and LE pain with Neurology. Notes that he has been ambulating with his SPC for up to 20 minutes at this time. Discussed benefits of aquatic therapy, which patient will likely benefit from d/t chronicity and severity of pain. Will get patient set up for aquatic therapy at [facility] at 1x/week frequency, while maintaining 1x/week frequency here. Adjusted walker height to allow patient to stand taller and improve gait deviations. Worked on STS with cueing for set up and encouraging anterior trunk lean as patient with tendency to lose balance posteriorly. Patient with difficulty completing standing heel raises d/t weakness- better success in sitting. Updated HEP to include sitting rather than standing heel raises- patient reported understanding. Patient without complaints and mentioning improved spirits upon leaving.    Comorbidities skin CA, liver transplant, HTN, lumbar DDD    PT Treatment/Interventions ADLs/Self Care Home Management;Cryotherapy;Electrical Stimulation;Iontophoresis 4mg /ml Dexamethasone;Moist Heat;Balance training;Therapeutic exercise;Therapeutic activities;Functional mobility training;Stair training;Gait training;DME Instruction;Ultrasound;Neuromuscular re-education;Patient/family education;Manual techniques;Vasopneumatic Device;Taping;Energy conservation;Dry needling;Passive range of motion;Scar mobilization    PT Next Visit  Plan assess B hip and knee AROM, BERG; reassess HEP    Consulted and Agree with Plan of Care Patient           Patient will benefit from skilled therapeutic intervention in order to improve the following deficits and impairments:  Abnormal  gait,Hypomobility,Increased edema,Decreased activity tolerance,Decreased strength,Increased fascial restricitons,Pain,Decreased balance,Decreased mobility,Difficulty walking,Increased muscle spasms,Improper body mechanics,Decreased range of motion,Impaired flexibility,Postural dysfunction  Visit Diagnosis: Muscle weakness (generalized)  Chronic left-sided low back pain with left-sided sciatica  Chronic pain of left knee  Difficulty in walking, not elsewhere classified  Other abnormalities of gait and mobility     Problem List There are no problems to display for this patient.    Janene Harvey, PT, DPT 06/15/20 3:33 PM   [facility] [address]  [place], [place], [address] Phone: [phone]   Fax:  [phone]  Name: Andrew Banks MRN: [id number] Date of Birth: 1950/04/22

## 2020-06-17 ENCOUNTER — Other Ambulatory Visit: Payer: Self-pay

## 2020-06-17 ENCOUNTER — Ambulatory Visit: Payer: No Typology Code available for payment source | Admitting: Physical Therapy

## 2020-06-17 ENCOUNTER — Encounter: Payer: Self-pay | Admitting: Physical Therapy

## 2020-06-17 DIAGNOSIS — R2689 Other abnormalities of gait and mobility: Secondary | ICD-10-CM

## 2020-06-17 DIAGNOSIS — M6281 Muscle weakness (generalized): Secondary | ICD-10-CM | POA: Diagnosis not present

## 2020-06-17 DIAGNOSIS — G8929 Other chronic pain: Secondary | ICD-10-CM

## 2020-06-17 DIAGNOSIS — M25562 Pain in left knee: Secondary | ICD-10-CM

## 2020-06-17 DIAGNOSIS — R262 Difficulty in walking, not elsewhere classified: Secondary | ICD-10-CM

## 2020-06-17 DIAGNOSIS — M5442 Lumbago with sciatica, left side: Secondary | ICD-10-CM

## 2020-06-17 NOTE — Therapy (Signed)
[facility] [address]  [place], [place], [address] Phone: [phone]   Fax:  [phone]  Physical Therapy Treatment  Patient Details  Name: Andrew Banks MRN: [id number] Date of Birth: 24-May-1950 Referring Provider (PT): Lowella Dandy, MD   Encounter Date: 06/17/2020   PT End of Session - 06/17/20 1527    Visit Number 3    Number of Visits 17    Date for PT Re-Evaluation 07/28/20    Authorization Type Medicare & VA    Authorization - Visit Number 3    Authorization - Number of Visits 15    PT Start Time 0300    PT Stop Time 1527    PT Time Calculation (min) 42 min    Equipment Utilized During Treatment Gait belt    Activity Tolerance Patient tolerated treatment well;Patient limited by fatigue    Behavior During Therapy WFL for tasks assessed/performed           Past Medical History:  Diagnosis Date  . Arthritis   . Degenerative disc disease, lumbar   . Hypertension   . 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 Skin cancer     Past Surgical History:  Procedure Laterality Date  . liver transplant      There were no vitals filed for this visit.   Subjective Assessment - 06/17/20 1447    Subjective Had some muscle soreness yesterday AM but felt fine by the evening.    Pertinent History skin CA, liver transplant, HTN, lumbar DDD    Diagnostic tests none recent    Patient Stated Goals decrease pain    Currently in Pain? Yes    Pain Score 5     Pain Location Back    Pain Descriptors / Indicators Constant;Dull    Pain Type Chronic pain    Pain Score 7    Pain Location Knee    Pain Orientation Left    Pain Descriptors / Indicators Aching    Pain Type Chronic pain    Pain Score 6    Pain Location Tibia    Pain Orientation Left;Lateral    Pain Descriptors / Indicators Aching    Pain Type Chronic pain              [facility] PT Assessment - 06/17/20 0001      Standardized Balance Assessment    Standardized Balance Assessment Berg Balance Test      Berg Balance Test   Sit to Stand Able to stand without using hands and stabilize independently    Standing Unsupported Able to stand safely 2 minutes    Sitting with Back Unsupported but Feet Supported on Floor or Stool Able to sit safely and securely 2 minutes    Stand to Sit Sits safely with minimal use of hands    Transfers Able to transfer safely, minor use of hands    Standing Unsupported with Eyes Closed Able to stand 10 seconds safely    Standing Unsupported with Feet Together Able to place feet together independently and stand 1 minute safely    From Standing, Reach Forward with Outstretched Arm Can reach forward >12 cm safely (5")   reaching 12 inches with minor LOB with self correction   From Standing Position, Pick up Object from Floor Able to pick up shoe, needs supervision    From Standing Position, Turn to Look Behind Over each Shoulder Looks behind from both sides and weight shifts well  Turn 360 Degrees Able to turn 360 degrees safely but slowly    Standing Unsupported, Alternately Place Feet on Step/Stool Able to complete >2 steps/needs minimal assist    Standing Unsupported, One Foot in [facility] balance while stepping or standing    Standing on One Leg Unable to try or needs assist to prevent fall    Total Score 41                         [facility] Adult PT Treatment/Exercise - 06/17/20 0001      Ambulation/Gait   Ambulation Distance (Feet) 80 Feet    Gait Pattern Step-through pattern;Step-to pattern;Decreased step length - right;Decreased step length - left;Decreased dorsiflexion - left;Decreased weight shift to left;Right flexed knee in stance;Left flexed knee in stance;Trunk flexed    Ambulation Surface Level;Indoor    Gait velocity decreased    Gait Comments adjustment to SPC height; good stability      Lumbar Exercises: Aerobic   Nustep L3 x 6 min (UEs/LEs)      Knee/Hip Exercises: Seated    Other Seated Knee/Hip Exercises seated heel raise 10x; 10x 5#   cues to keep toes down   Sit to Sand 1 set;without UE support;5 reps   yellow loop above knees                 PT Education - 06/17/20 1526    Education Details update to HEP- added 5# to sitting heel raise    Person(s) Educated Patient    Methods Explanation;Demonstration;Tactile cues;Verbal cues    Comprehension Verbalized understanding;Returned demonstration            PT Short Term Goals - 06/17/20 1530      PT SHORT TERM GOAL #1   Title Patient to be independent with initial HEP.    Time 3    Period Weeks    Status Achieved    Target Date 06/23/20             PT Long Term Goals - 06/15/20 1532      PT LONG TERM GOAL #1   Title Patient to be independent with advanced HEP.    Time 8    Period Weeks    Status On-going      PT LONG TERM GOAL #2   Title Patient to demonstrate B LE strength >/=4/5.    Time 8    Period Weeks    Status On-going      PT LONG TERM GOAL #3   Title Patient to score <15 sec on 5xSTS in order to decrease risk of falls.    Time 8    Period Weeks    Status On-going      PT LONG TERM GOAL #4   Title Patient to score <14 sec on TUG with LRAD in order to decrease risk of falls.    Time 8    Period Weeks    Status On-going      PT LONG TERM GOAL #5   Title Patient to score atleast 45/56 on BERG in order to decrease risk of falls.    Time 8    Period Weeks    Status On-going                 Plan - 06/17/20 1527    Clinical Impression Statement Patient arrived to session with some muscle soreness after last session, which resolved by that night. Patient scored 41/56  on Berg, indicating increased risk of falls. Most challenge was demonstrated with SLS activities and narrow BOS. Trialed gait training with SPC, with patient demonstrating much improved upright posture compared to 4WW. Encouraged patient to continue using SPC in the home rather that using WC at  times- patient reported understanding. Able to increase weighted resistance with sitting heel raises today with good effort. No complaints at end of session.    Comorbidities skin CA, liver transplant, HTN, lumbar DDD    PT Treatment/Interventions ADLs/Self Care Home Management;Cryotherapy;Electrical Stimulation;Iontophoresis 4mg /ml Dexamethasone;Moist Heat;Balance training;Therapeutic exercise;Therapeutic activities;Functional mobility training;Stair training;Gait training;DME Instruction;Ultrasound;Neuromuscular re-education;Patient/family education;Manual techniques;Vasopneumatic Device;Taping;Energy conservation;Dry needling;Passive range of motion;Scar mobilization    PT Next Visit Plan assess B hip and knee AROM    Consulted and Agree with Plan of Care Patient           Patient will benefit from skilled therapeutic intervention in order to improve the following deficits and impairments:  Abnormal gait,Hypomobility,Increased edema,Decreased activity tolerance,Decreased strength,Increased fascial restricitons,Pain,Decreased balance,Decreased mobility,Difficulty walking,Increased muscle spasms,Improper body mechanics,Decreased range of motion,Impaired flexibility,Postural dysfunction  Visit Diagnosis: Muscle weakness (generalized)  Chronic left-sided low back pain with left-sided sciatica  Chronic pain of left knee  Difficulty in walking, not elsewhere classified  Other abnormalities of gait and mobility     Problem List There are no problems to display for this patient.    Janene Harvey, PT, DPT 06/17/20 3:36 PM   [facility] [address]  [place], [place], [address] Phone: [phone]   Fax:  [phone]  Name: Andrew Banks MRN: [id number] Date of Birth: [birth year]

## 2020-06-18 ENCOUNTER — Encounter: Payer: Non-veteran care | Admitting: Physical Therapy

## 2020-06-22 ENCOUNTER — Ambulatory Visit: Payer: No Typology Code available for payment source | Admitting: Physical Therapy

## 2020-06-22 ENCOUNTER — Encounter: Payer: Self-pay | Admitting: Physical Therapy

## 2020-06-22 ENCOUNTER — Other Ambulatory Visit: Payer: Self-pay

## 2020-06-22 DIAGNOSIS — R2689 Other abnormalities of gait and mobility: Secondary | ICD-10-CM

## 2020-06-22 DIAGNOSIS — R262 Difficulty in walking, not elsewhere classified: Secondary | ICD-10-CM

## 2020-06-22 DIAGNOSIS — G8929 Other chronic pain: Secondary | ICD-10-CM

## 2020-06-22 DIAGNOSIS — M6281 Muscle weakness (generalized): Secondary | ICD-10-CM | POA: Diagnosis not present

## 2020-06-22 DIAGNOSIS — M25562 Pain in left knee: Secondary | ICD-10-CM

## 2020-06-22 NOTE — Therapy (Signed)
[facility] [address]  [place], [place], [address] Phone: [phone]   Fax:  [phone]  Physical Therapy Treatment  Patient Details  Name: Andrew Banks MRN: [id number] Date of Birth: [birth year] Referring Provider (PT): Lowella Dandy, MD   Encounter Date: 06/22/2020   PT End of Session - 06/22/20 1527    Visit Number 4    Number of Visits 17    Date for PT Re-Evaluation 07/28/20    Authorization Type Medicare & VA    Authorization - Visit Number 4    Authorization - Number of Visits 15    PT Start Time 5643    PT Stop Time 3295    PT Time Calculation (min) 45 min    Activity Tolerance Patient tolerated treatment well;Patient limited by pain    Behavior During Therapy [facility] for tasks assessed/performed           Past Medical History:  Diagnosis Date  . Arthritis   . Degenerative disc disease, lumbar   . Hypertension   . 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 Skin cancer     Past Surgical History:  Procedure Laterality Date  . liver transplant      There were no vitals filed for this visit.   Subjective Assessment - 06/22/20 1446    Subjective Has been having more pain in the L lower leg to the foot d/t using the SPC more.    Pertinent History skin CA, liver transplant, HTN, lumbar DDD    Diagnostic tests none recent    Patient Stated Goals decrease pain    Currently in Pain? Yes    Pain Score 5    Pain Location Knee    Pain Orientation Left    Pain Descriptors / Indicators Burning    Pain Type Chronic pain    Pain Radiating Towards foot              [facility] PT Assessment - 06/22/20 0001      ROM / Strength   AROM / PROM / Strength PROM      AROM   AROM Assessment Site Hip;Knee    Right/Left Hip Right;Left    Right Hip External Rotation  53    Right Hip Internal Rotation  20    Left Hip External Rotation  37   pain   Left Hip Internal Rotation  15   pain   Right/Left Knee  Left;Right    Right Knee Extension 0    Right Knee Flexion 129    Left Knee Extension 3    Left Knee Flexion 107      PROM   PROM Assessment Site Knee    Right/Left Knee Left;Right    Right Knee Extension 0    Right Knee Flexion 132    Left Knee Extension 0    Left Knee Flexion 113                         [facility] Adult PT Treatment/Exercise - 06/22/20 0001      Lumbar Exercises: Stretches   Lower Trunk Rotation Limitations x10   ROM to tolerance     Lumbar Exercises: Aerobic   Nustep L5 x 6 min (UEs/LEs)      Lumbar Exercises: Supine   Bridge 10 reps    Bridge Limitations limited ROM; c/o nonpainful cavitation in LB   cues to tighten core and promote rhythmic  breathing     Knee/Hip Exercises: Seated   Sit to Sand without UE support;5 reps;2 sets   reaching for box in front; 2nd set with yellow loop above knees     Knee/Hip Exercises: Supine   Quad Sets Strengthening;Left;1 set;5 reps    Quad Sets Limitations 5x10" with towel roll under heel   cues for rhythmic breathing   Straight Leg Raises Strengthening;Left;1 set;5 reps    Straight Leg Raises Limitations c/o L knee and buttock pain   no quad lag     Ankle Exercises: Sidelying   Ankle Eversion AROM;Left;10 reps   limitde ROM                 PT Education - 06/22/20 1526    Education Details update to HEP    Person(s) Educated Patient    Methods Explanation;Demonstration;Tactile cues;Verbal cues;Handout    Comprehension Verbalized understanding;Returned demonstration            PT Short Term Goals - 06/17/20 1530      PT SHORT TERM GOAL #1   Title Patient to be independent with initial HEP.    Time 3    Period Weeks    Status Achieved    Target Date 06/23/20             PT Long Term Goals - 06/15/20 1532      PT LONG TERM GOAL #1   Title Patient to be independent with advanced HEP.    Time 8    Period Weeks    Status On-going      PT LONG TERM GOAL #2   Title Patient to  demonstrate B LE strength >/=4/5.    Time 8    Period Weeks    Status On-going      PT LONG TERM GOAL #3   Title Patient to score <15 sec on 5xSTS in order to decrease risk of falls.    Time 8    Period Weeks    Status On-going      PT LONG TERM GOAL #4   Title Patient to score <14 sec on TUG with LRAD in order to decrease risk of falls.    Time 8    Period Weeks    Status On-going      PT LONG TERM GOAL #5   Title Patient to score atleast 45/56 on BERG in order to decrease risk of falls.    Time 8    Period Weeks    Status On-going                 Plan - 06/22/20 1528    Clinical Impression Statement Patient arrived to session with report of increased pain in the L lower leg to the foot d/t using the SPC more. STS transfers were performed with target in front of patient to improve anterior trunk lean with excellent improvement since last session. Hip and knee AROM was assessed, which revealed considerable limitation on L > R. Mat ther-ex was progressed with patient demonstrating limited ROM with bridges d/t weakness/hip flexor tightness. Progressed quad strengthening with patient demonstrating good quad activation and isolation, however limited by L knee and buttock pain with SLRs. Updated HEP with bridges for continued practice- patient reported understanding and without complaints at end of session.    Comorbidities skin CA, liver transplant, HTN, lumbar DDD    PT Treatment/Interventions ADLs/Self Care Home Management;Cryotherapy;Electrical Stimulation;Iontophoresis 4mg /ml Dexamethasone;Moist Heat;Balance training;Therapeutic exercise;Therapeutic activities;Functional mobility training;Stair training;Gait training;DME  Instruction;Ultrasound;Neuromuscular re-education;Patient/family education;Manual techniques;Vasopneumatic Device;Taping;Energy conservation;Dry needling;Passive range of motion;Scar mobilization    PT Next Visit Plan progress LE strength    Consulted and Agree  with Plan of Care Patient           Patient will benefit from skilled therapeutic intervention in order to improve the following deficits and impairments:  Abnormal gait,Hypomobility,Increased edema,Decreased activity tolerance,Decreased strength,Increased fascial restricitons,Pain,Decreased balance,Decreased mobility,Difficulty walking,Increased muscle spasms,Improper body mechanics,Decreased range of motion,Impaired flexibility,Postural dysfunction  Visit Diagnosis: Muscle weakness (generalized)  Chronic left-sided low back pain with left-sided sciatica  Chronic pain of left knee  Difficulty in walking, not elsewhere classified  Other abnormalities of gait and mobility     Problem List There are no problems to display for this patient.    Janene Harvey, PT, DPT 06/22/20 3:29 PM   [facility] [address]  [place], [place], [address] Phone: [phone]   Fax:  [phone]  Name: Beryl Balz MRN: [id number] Date of Birth: [birth year]

## 2020-06-25 ENCOUNTER — Other Ambulatory Visit: Payer: Self-pay

## 2020-06-25 ENCOUNTER — Encounter: Payer: Self-pay | Admitting: Physical Therapy

## 2020-06-25 ENCOUNTER — Ambulatory Visit: Payer: No Typology Code available for payment source | Admitting: Physical Therapy

## 2020-06-25 DIAGNOSIS — M6281 Muscle weakness (generalized): Secondary | ICD-10-CM | POA: Diagnosis not present

## 2020-06-25 DIAGNOSIS — R262 Difficulty in walking, not elsewhere classified: Secondary | ICD-10-CM

## 2020-06-25 DIAGNOSIS — M25562 Pain in left knee: Secondary | ICD-10-CM

## 2020-06-25 DIAGNOSIS — R2689 Other abnormalities of gait and mobility: Secondary | ICD-10-CM

## 2020-06-25 DIAGNOSIS — M5442 Lumbago with sciatica, left side: Secondary | ICD-10-CM

## 2020-06-25 DIAGNOSIS — G8929 Other chronic pain: Secondary | ICD-10-CM

## 2020-06-25 NOTE — Patient Instructions (Addendum)
TENS stands for Transcutaneous Electrical Nerve Stimulation. In other words, electrical impulses are allowed to pass through the skin in order to excite a nerve.   Purpose and Use of TENS:  TENS is a method used to manage acute and chronic pain without the use of drugs. It has been effective in managing pain associated with surgery, sprains, strains, trauma, rheumatoid arthritis, and neuralgias. It is a non-addictive, low risk, and non-invasive technique used to control pain. It is not, by any means, a curative form of treatment.   How TENS Works:  Most TENS units are a Paramedic unit powered by one 9 volt battery. Attached to the outside of the unit are two lead wires where two pins and/or snaps connect on each wire. All units come with a set of four reusable pads or electrodes. These are placed on the skin surrounding the area involved. By inserting the leads into  the pads, the electricity can pass from the unit making the circuit complete.  As the intensity is turned up slowly, the electrical current enters the body from the electrodes through the skin to the surrounding nerve fibers. This triggers the release of hormones from within the body. These hormones contain pain relievers. By increasing the circulation of these hormones, the person's pain may be lessened. It is also believed that the electrical stimulation itself helps to block the pain messages being sent to the brain, thus also decreasing the body's perception of pain.   Hazards:  TENS units are NOT to be used by patients with PACEMAKERS, DEFIBRILLATORS, DIABETIC PUMPS, PREGNANT WOMEN, and patients with SEIZURE DISORDERS.  TENS units are NOT to be used over the heart, throat, brain, or spinal cord.  One of the major side effects from the TENS unit may be skin irritation. Some people may develop a rash if they are sensitive to the materials used in the electrodes or the connecting wires.   Wear the unit for 15 min.   Avoid  overuse due the body getting used to the stem making it not as effective over time.     TENS UNIT  This is helpful for muscle pain and spasm.   Search and Purchase a TENS 7000 2nd edition at www.tenspros.com or www.amazon.com  (It should be less than $30)     TENS unit instructions:   Do not shower or bathe with the unit on  Turn the unit off before removing electrodes or batteries  If the electrodes lose stickiness add a drop of water to the electrodes after they are disconnected from the unit and place on plastic sheet. If you continued to have difficulty, call the TENS unit company to purchase more electrodes.  Do not apply lotion on the skin area prior to use. Make sure the skin is clean and dry as this will help prolong the life of the electrodes.  After use, always check skin for unusual red areas, rash or other skin difficulties. If there are any skin problems, does not apply electrodes to the same area.  Never remove the electrodes from the unit by pulling the wires.  Do not use the TENS unit or electrodes other than as directed.  Do not change electrode placement without consulting your therapist or physician.  Keep 2 fingers with between each electrode.

## 2020-06-25 NOTE — Therapy (Signed)
[facility] [address]  [place], [place], [address] Phone: [phone]   Fax:  [phone]  Physical Therapy Treatment  Patient Details  Name: Andrew Banks MRN: [id number] Date of Birth: 1950/08/25 Referring Provider (PT): Lowella Dandy, MD   Encounter Date: 06/25/2020   PT End of Session - 06/25/20 1533    Visit Number 5    Number of Visits 17    Date for PT Re-Evaluation 07/28/20    Authorization Type Medicare & VA    Authorization - Visit Number 5    Authorization - Number of Visits 15    PT Start Time 7564    PT Stop Time 1538    PT Time Calculation (min) 53 min    Activity Tolerance Patient tolerated treatment well    Behavior During Therapy [facility] for tasks assessed/performed           Past Medical History:  Diagnosis Date  . Arthritis   . Degenerative disc disease, lumbar   . Hypertension   . 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 Skin cancer     Past Surgical History:  Procedure Laterality Date  . liver transplant      There were no vitals filed for this visit.   Subjective Assessment - 06/25/20 1448    Subjective Not much new since last sesison. Still having increased pain since using the [facility] more.    Pertinent History skin CA, liver transplant, HTN, lumbar DDD    Diagnostic tests none recent    Patient Stated Goals decrease pain    Currently in Pain? Yes    Pain Score 6     Pain Location Back    Pain Orientation Left;Lower    Pain Descriptors / Indicators Dull;Constant    Pain Type Chronic pain    Pain Score 6    Pain Location Knee    Pain Orientation Left    Pain Descriptors / Indicators Burning    Pain Type Chronic pain                             [facility] Adult PT Treatment/Exercise - 06/25/20 0001      Lumbar Exercises: Stretches   Single Knee to Chest Stretch Left;5 reps;10 seconds    Single Knee to Chest Stretch Limitations with strap assist   cues to  avoid puishing into pain   Lower Trunk Rotation Limitations x10   cues to slow down     Lumbar Exercises: Aerobic   Nustep L5 x 6 min (UEs/LEs)      Lumbar Exercises: Sidelying   Other Sidelying Lumbar Exercises R/L open book stretch   cues to avoid pushing into pain     Modalities   Modalities Electrical Stimulation;Cryotherapy      Cryotherapy   Number Minutes Cryotherapy 10 Minutes    Cryotherapy Location Knee   L   Type of Cryotherapy Ice pack      Electrical Stimulation   Electrical Stimulation Location L knee    Electrical Stimulation Action IFC    Electrical Stimulation Parameters output to tolerance; 10 min    Electrical Stimulation Goals Pain      Ankle Exercises: Sidelying   Ankle Inversion AROM;Left;10 reps    Ankle Eversion AROM;Left;10 reps   rest break in between                 PT Education - 06/25/20  [facility]    Education Details update to HEP; edu on TENS use/benefits, wear time, electrode placement, precautions    Person(s) Educated Patient    Methods Explanation;Demonstration;Tactile cues;Verbal cues;Handout    Comprehension Verbalized understanding;Returned demonstration            PT Short Term Goals - 06/17/20 1530      PT SHORT TERM GOAL #1   Title Patient to be independent with initial HEP.    Time 3    Period Weeks    Status Achieved    Target Date 06/23/20             PT Long Term Goals - 06/15/20 1532      PT LONG TERM GOAL #1   Title Patient to be independent with advanced HEP.    Time 8    Period Weeks    Status On-going      PT LONG TERM GOAL #2   Title Patient to demonstrate B LE strength >/=4/5.    Time 8    Period Weeks    Status On-going      PT LONG TERM GOAL #3   Title Patient to score <15 sec on 5xSTS in order to decrease risk of falls.    Time 8    Period Weeks    Status On-going      PT LONG TERM GOAL #4   Title Patient to score <14 sec on TUG with LRAD in order to decrease risk of falls.    Time 8     Period Weeks    Status On-going      PT LONG TERM GOAL #5   Title Patient to score atleast 45/56 on BERG in order to decrease risk of falls.    Time 8    Period Weeks    Status On-going                 Plan - 06/25/20 1534    Clinical Impression Statement Patient still noting slightly elevated pain levels since increasing SPC use at home. Demonstrating inconsistent [id number] use with transfers, requiring correction. Worked on gentle hip and lumbopelvic stretching and mobility with patient initially demonstrating excessively quick pace and amplitude of movements. Patient today required consistent cueing to avoid pushing into pain with these activities. Patient with weakness and difficulty performing L ankle rotation against resistance, thus updated this into HEP for continued practice. Educated patient on TENS use/benefits, wear time, electrode placement, precautions for pain management at home. Patient reported understanding. Ended session with ice pack and e-stim to L knee to address pain. Patient reporting "calmed" pain at end of session.    Comorbidities skin CA, liver transplant, HTN, lumbar DDD    PT Treatment/Interventions ADLs/Self Care Home Management;Cryotherapy;Electrical Stimulation;Iontophoresis 4mg /ml Dexamethasone;Moist Heat;Balance training;Therapeutic exercise;Therapeutic activities;Functional mobility training;Stair training;Gait training;DME Instruction;Ultrasound;Neuromuscular re-education;Patient/family education;Manual techniques;Vasopneumatic Device;Taping;Energy conservation;Dry needling;Passive range of motion;Scar mobilization    PT Next Visit Plan progress LE strength, balance, postural correction    Consulted and Agree with Plan of Care Patient           Patient will benefit from skilled therapeutic intervention in order to improve the following deficits and impairments:  Abnormal gait,Hypomobility,Increased edema,Decreased activity tolerance,Decreased  strength,Increased fascial restricitons,Pain,Decreased balance,Decreased mobility,Difficulty walking,Increased muscle spasms,Improper body mechanics,Decreased range of motion,Impaired flexibility,Postural dysfunction  Visit Diagnosis: Muscle weakness (generalized)  Chronic left-sided low back pain with left-sided sciatica  Chronic pain of left knee  Difficulty in walking, not elsewhere classified  Other abnormalities of gait  and mobility     Problem List There are no problems to display for this patient.   Janene Harvey, PT, DPT 06/25/20 3:43 PM    [facility] [address]  [place], [place], [address] Phone: [phone]   Fax:  [phone]  Name: Andrew Banks MRN: [id number] Date of Birth: 1950-05-09

## 2020-06-30 ENCOUNTER — Encounter: Payer: Non-veteran care | Admitting: Physical Therapy

## 2020-07-01 ENCOUNTER — Encounter: Payer: Self-pay | Admitting: Physical Therapy

## 2020-07-01 ENCOUNTER — Other Ambulatory Visit: Payer: Self-pay

## 2020-07-01 ENCOUNTER — Ambulatory Visit: Payer: No Typology Code available for payment source | Attending: General Practice | Admitting: Physical Therapy

## 2020-07-01 DIAGNOSIS — M25562 Pain in left knee: Secondary | ICD-10-CM | POA: Diagnosis present

## 2020-07-01 DIAGNOSIS — R2689 Other abnormalities of gait and mobility: Secondary | ICD-10-CM | POA: Diagnosis present

## 2020-07-01 DIAGNOSIS — R262 Difficulty in walking, not elsewhere classified: Secondary | ICD-10-CM | POA: Diagnosis present

## 2020-07-01 DIAGNOSIS — M5442 Lumbago with sciatica, left side: Secondary | ICD-10-CM | POA: Insufficient documentation

## 2020-07-01 DIAGNOSIS — G8929 Other chronic pain: Secondary | ICD-10-CM | POA: Diagnosis present

## 2020-07-01 DIAGNOSIS — M6281 Muscle weakness (generalized): Secondary | ICD-10-CM | POA: Diagnosis present

## 2020-07-01 NOTE — Therapy (Signed)
[facility] [address]  [place], [place], [address] Phone: [phone]   Fax:  [phone]  Physical Therapy Treatment  Patient Details  Name: Andrew Banks MRN: [id number] Date of Birth: 11/10/1950 Referring Provider (PT): Lowella Dandy, MD   Encounter Date: 07/01/2020   PT End of Session - 07/01/20 1403    Visit Number 6    Number of Visits 17    Date for PT Re-Evaluation 07/28/20    Authorization Type Medicare & VA    Authorization - Visit Number 6    Authorization - Number of Visits 15    PT Start Time 1310    PT Stop Time 1357    PT Time Calculation (min) 47 min    Equipment Utilized During Treatment Gait belt    Activity Tolerance Patient tolerated treatment well;Patient limited by pain    Behavior During Therapy WFL for tasks assessed/performed           Past Medical History:  Diagnosis Date  . Arthritis   . Degenerative disc disease, lumbar   . Hypertension   . 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 Skin cancer     Past Surgical History:  Procedure Laterality Date  . liver transplant      There were no vitals filed for this visit.   Subjective Assessment - 07/01/20 1312    Subjective Doing okay today. Notes improvement in pain from e-stim and has ordered one.    Pertinent History skin CA, liver transplant, HTN, lumbar DDD    Diagnostic tests none recent    Patient Stated Goals decrease pain    Currently in Pain? Yes    Pain Score 5     Pain Location Back    Pain Orientation Right;Left;Lower    Pain Descriptors / Indicators Constant;Dull    Pain Type Chronic pain    Pain Radiating Towards radiating to L lower leg                             [facility] Adult PT Treatment/Exercise - 07/01/20 0001      Neuro Re-ed    Neuro Re-ed Details  R foot tap on 6" step with 3-1 fingers support on counter & CGA 2x10   improved control with subsequent reps     Lumbar Exercises:  Aerobic   Nustep L5 x 6 min (UEs/LEs)      Lumbar Exercises: Standing   Row Strengthening;Both;10 reps;Theraband    Theraband Level (Row) Level 2 (Red)    Row Limitations 2x10; cues to widen BOS and shift to L    Shoulder Extension Strengthening;Both;10 reps;Theraband    Theraband Level (Shoulder Extension) Level 1 (Yellow)    Shoulder Extension Limitations 2x10; cues to widen BOS and shift to L      Knee/Hip Exercises: Standing   Forward Step Up Left;1 set;5 reps;Hand Hold: 1;Step Height: 4"    Forward Step Up Limitations L step up/R back   CGA and heavy UE use; tendency to hoist self up when ascending   Functional Squat 1 set;10 reps    Functional Squat Limitations mini squat at counter   c/o L knee pain, thus cued to decrease squat depth   Wall Squat 1 set;10 reps    Wall Squat Limitations mini squat                  PT Education - 07/01/20  1403    Education Details update to HEP    Person(s) Educated Patient    Methods Explanation;Demonstration;Tactile cues;Verbal cues;Handout    Comprehension Verbalized understanding;Returned demonstration            PT Short Term Goals - 06/17/20 1530      PT SHORT TERM GOAL #1   Title Patient to be independent with initial HEP.    Time 3    Period Weeks    Status Achieved    Target Date 06/23/20             PT Long Term Goals - 06/15/20 1532      PT LONG TERM GOAL #1   Title Patient to be independent with advanced HEP.    Time 8    Period Weeks    Status On-going      PT LONG TERM GOAL #2   Title Patient to demonstrate B LE strength >/=4/5.    Time 8    Period Weeks    Status On-going      PT LONG TERM GOAL #3   Title Patient to score <15 sec on 5xSTS in order to decrease risk of falls.    Time 8    Period Weeks    Status On-going      PT LONG TERM GOAL #4   Title Patient to score <14 sec on TUG with LRAD in order to decrease risk of falls.    Time 8    Period Weeks    Status On-going      PT LONG  TERM GOAL #5   Title Patient to score atleast 45/56 on BERG in order to decrease risk of falls.    Time 8    Period Weeks    Status On-going                 Plan - 07/01/20 1404    Clinical Impression Statement Patient without new complaints today. Patient demonstrates continued inconsistent use of 4WW with transfers, requiring continued cueing for safety. Worked on postural strengthening in standing for additional balance challenge. Patient demonstrated good ability to correct posture according to cues to widen BOS and shift to L d/t tendency to avoid L WBing, as well as to decrease speed and increase control throughout. Sitting rest breaks were taken in between standing ther-ex today d/t pain and fatigue. Shallow squats were initiated with patient demonstrating fairly good ability to maintain equal weight shift, however limited by L knee and hip pain. Dynamic balance training was performed with consistent cueing to shift weight shift to L, with improved control demonstrated with subsequent reps. Patient reported L knee soreness at end of session, but declined modalities. Patient is demonstrating good effort and improved exercise tolerance despite continued pain.    Comorbidities skin CA, liver transplant, HTN, lumbar DDD    PT Treatment/Interventions ADLs/Self Care Home Management;Cryotherapy;Electrical Stimulation;Iontophoresis 4mg /ml Dexamethasone;Moist Heat;Balance training;Therapeutic exercise;Therapeutic activities;Functional mobility training;Stair training;Gait training;DME Instruction;Ultrasound;Neuromuscular re-education;Patient/family education;Manual techniques;Vasopneumatic Device;Taping;Energy conservation;Dry needling;Passive range of motion;Scar mobilization    PT Next Visit Plan progress LE strength, balance, postural correction    Consulted and Agree with Plan of Care Patient           Patient will benefit from skilled therapeutic intervention in order to improve the  following deficits and impairments:  Abnormal gait,Hypomobility,Increased edema,Decreased activity tolerance,Decreased strength,Increased fascial restricitons,Pain,Decreased balance,Decreased mobility,Difficulty walking,Increased muscle spasms,Improper body mechanics,Decreased range of motion,Impaired flexibility,Postural dysfunction  Visit Diagnosis: Muscle weakness (generalized)  Chronic left-sided low  back pain with left-sided sciatica  Chronic pain of left knee  Difficulty in walking, not elsewhere classified  Other abnormalities of gait and mobility     Problem List There are no problems to display for this patient.    Janene Harvey, PT, DPT 07/01/20 2:06 PM   [facility] [address]  [place], [place], [address] Phone: [phone]   Fax:  [phone]  Name: Andrew Banks MRN: [id number] Date of Birth: Jun 09, 1950

## 2020-07-02 ENCOUNTER — Ambulatory Visit (HOSPITAL_BASED_OUTPATIENT_CLINIC_OR_DEPARTMENT_OTHER): Payer: Non-veteran care | Admitting: Physical Therapy

## 2020-07-06 ENCOUNTER — Ambulatory Visit (HOSPITAL_BASED_OUTPATIENT_CLINIC_OR_DEPARTMENT_OTHER): Payer: Non-veteran care | Admitting: Physical Therapy

## 2020-07-07 ENCOUNTER — Other Ambulatory Visit: Payer: Self-pay

## 2020-07-07 ENCOUNTER — Encounter (HOSPITAL_BASED_OUTPATIENT_CLINIC_OR_DEPARTMENT_OTHER): Payer: Self-pay | Admitting: Physical Therapy

## 2020-07-07 ENCOUNTER — Ambulatory Visit (HOSPITAL_BASED_OUTPATIENT_CLINIC_OR_DEPARTMENT_OTHER): Payer: Non-veteran care | Admitting: Physical Therapy

## 2020-07-07 ENCOUNTER — Ambulatory Visit (HOSPITAL_BASED_OUTPATIENT_CLINIC_OR_DEPARTMENT_OTHER): Payer: No Typology Code available for payment source | Attending: General Practice | Admitting: Physical Therapy

## 2020-07-07 DIAGNOSIS — M6281 Muscle weakness (generalized): Secondary | ICD-10-CM | POA: Insufficient documentation

## 2020-07-07 DIAGNOSIS — M25562 Pain in left knee: Secondary | ICD-10-CM | POA: Insufficient documentation

## 2020-07-07 DIAGNOSIS — R262 Difficulty in walking, not elsewhere classified: Secondary | ICD-10-CM | POA: Insufficient documentation

## 2020-07-07 DIAGNOSIS — G8929 Other chronic pain: Secondary | ICD-10-CM | POA: Diagnosis present

## 2020-07-07 DIAGNOSIS — R2689 Other abnormalities of gait and mobility: Secondary | ICD-10-CM | POA: Diagnosis present

## 2020-07-07 DIAGNOSIS — M5442 Lumbago with sciatica, left side: Secondary | ICD-10-CM | POA: Diagnosis present

## 2020-07-07 NOTE — Therapy (Signed)
[facility] [place], [place], [address] Phone: [phone]   Fax:  [phone]  Physical Therapy Treatment  Patient Details  Name: Andrew Banks MRN: [id number] Date of Birth: 01-Sep-1950 Referring Provider (PT): Lowella Dandy, MD   Encounter Date: 07/07/2020   PT End of Session - 07/07/20 1311    Visit Number 7    Number of Visits 17    Date for PT Re-Evaluation 07/28/20    Authorization Type Medicare & VA    PT Start Time 1206    PT Stop Time 1250    PT Time Calculation (min) 44 min    Equipment Utilized During Treatment Other (comment)   Ankle cuff and waist buoys, kick board, noodle/sqoodle, nekdoodle, weights and barbells   Activity Tolerance Patient tolerated treatment well;Patient limited by pain           Past Medical History:  Diagnosis Date  . Arthritis   . Degenerative disc disease, lumbar   . Hypertension   . Liver transplanted ([place])   . Skin cancer     Past Surgical History:  Procedure Laterality Date  . liver transplant      There were no vitals filed for this visit.   Subjective Assessment - 07/07/20 1309    Subjective Anxious to get in the pool to strt therapy.  Feeling ok.    Patient is accompained by: Family member    Pertinent History skin CA, liver transplant, HTN, lumbar DDD    Limitations Sitting;Lifting;Standing;Walking;House hold activities          TREATMENT Pt seen for aquatic therapy today.  Treatment took place in water 3.25-4.8 ft in depth at the [facility] pool. Temp of water was 91.  Pt entered/exited the pool via stairs (step to pattern) cga with bilat rail.  Warm up Water walking forward, backward, side stepping x 6 lengths hand held assist.  VC for increased step length, hip/ knee flex and speed. Pt with 4-5 LOB recovered with min cga.  Seated on water bench, submerged 70% Stretching gastroc and hamstring active and  passive Strengthening: ankle buoy for resistance knee flex/ext 2 x 10 reps.  Marching 2 x 10 reps  Standing Strengthening hip flex, extension, add and abd x 10 reps.  Hip circles right and left x 10 reps.  Cueing and instruction on core stabilization while completing exercises. Core str and balance: Planks using kick board 3 x 10 reps.  Water walking while completing planks using noodle 3 widths lengths of pool.  Suspended in vertical using noodles/sqoodles: kicking and scissoring, gentle knees to chest engaging abdominals.  Pt requires buoyancy for support and to offload joints with strengthening exercises. Viscosity of the water is needed for resistance of strengthening; water current perturbations provides challenge to standing balance unsupported, requiring increased core activation.                           PT Education - 07/07/20 1310    Education Details Benefits of Aquatic therapy.    Person(s) Educated Patient;Spouse    Methods Explanation    Comprehension Verbalized understanding            PT Short Term Goals - 06/17/20 1530      PT SHORT TERM GOAL #1   Title Patient to be independent with initial HEP.    Time 3    Period Weeks    Status Achieved  Target Date 06/23/20             PT Long Term Goals - 06/15/20 1532      PT LONG TERM GOAL #1   Title Patient to be independent with advanced HEP.    Time 8    Period Weeks    Status On-going      PT LONG TERM GOAL #2   Title Patient to demonstrate B LE strength >/=4/5.    Time 8    Period Weeks    Status On-going      PT LONG TERM GOAL #3   Title Patient to score <15 sec on 5xSTS in order to decrease risk of falls.    Time 8    Period Weeks    Status On-going      PT LONG TERM GOAL #4   Title Patient to score <14 sec on TUG with LRAD in order to decrease risk of falls.    Time 8    Period Weeks    Status On-going      PT LONG TERM GOAL #5   Title Patient to score atleast  45/56 on BERG in order to decrease risk of falls.    Time 8    Period Weeks    Status On-going                 Plan - 07/07/20 1313    Clinical Impression Statement Initiated Aquatic therapy in which pt is comfortable.  Pt engaged in stretching, strengthening and balance challenges in varying depths. Cueing for core stabilization throughout session. He reports he feels more in control of his body and indep in movement without fear of falling.  He requires multiple rest periods.  Benefited from aerobic capacity training increasing his heart rate multiple trials. Pt putting forth excellent effort. Pt does not have access to pool.  Did not add to HEP    Personal Factors and Comorbidities Age;Comorbidity 3+;Fitness;Past/Current Experience;Time since onset of injury/illness/exacerbation    Comorbidities skin CA, liver transplant, HTN, lumbar DDD    Examination-Activity Limitations Sit;Sleep;Bed Mobility;Bend;Squat;Stairs;Carry;Stand;Toileting;Dressing;Transfers;Hygiene/Grooming;Lift;Locomotion Level;Reach Overhead    Examination-Participation Restrictions Church;Cleaning;Community Activity;Interpersonal Relationship;Laundry;Meal Prep;Occupation;Shop    Stability/Clinical Decision Making Stable/Uncomplicated    Clinical Decision Making Low    Rehab Potential Good    PT Frequency 2x / week    PT Duration 8 weeks    PT Treatment/Interventions ADLs/Self Care Home Management;Cryotherapy;Electrical Stimulation;Iontophoresis 4mg /ml Dexamethasone;Moist Heat;Balance training;Therapeutic exercise;Therapeutic activities;Functional mobility training;Stair training;Gait training;DME Instruction;Ultrasound;Neuromuscular re-education;Patient/family education;Manual techniques;Vasopneumatic Device;Taping;Energy conservation;Dry needling;Passive range of motion;Scar mobilization    PT Next Visit Plan tolerated aquatic therapy?? To be seen on land    Consulted and Agree with Plan of Care Patient            Patient will benefit from skilled therapeutic intervention in order to improve the following deficits and impairments:  Abnormal gait,Hypomobility,Increased edema,Decreased activity tolerance,Decreased strength,Increased fascial restricitons,Pain,Decreased balance,Decreased mobility,Difficulty walking,Increased muscle spasms,Improper body mechanics,Decreased range of motion,Impaired flexibility,Postural dysfunction  Visit Diagnosis: Muscle weakness (generalized)  Chronic left-sided low back pain with left-sided sciatica  Chronic pain of left knee  Difficulty in walking, not elsewhere classified  Other abnormalities of gait and mobility     Problem List There are no problems to display for this patient.   [name] MPT 07/07/2020, 1:26 PM  [facility] [address] [place], [place], [address] Phone: [phone]   Fax:  [phone]  Name: Andrew Banks MRN: [id number] Date of Birth: [birth year]

## 2020-07-09 ENCOUNTER — Encounter: Payer: Self-pay | Admitting: Physical Therapy

## 2020-07-09 ENCOUNTER — Ambulatory Visit: Payer: No Typology Code available for payment source | Admitting: Physical Therapy

## 2020-07-09 ENCOUNTER — Other Ambulatory Visit: Payer: Self-pay

## 2020-07-09 DIAGNOSIS — M6281 Muscle weakness (generalized): Secondary | ICD-10-CM

## 2020-07-09 DIAGNOSIS — G8929 Other chronic pain: Secondary | ICD-10-CM

## 2020-07-09 DIAGNOSIS — R262 Difficulty in walking, not elsewhere classified: Secondary | ICD-10-CM

## 2020-07-09 DIAGNOSIS — R2689 Other abnormalities of gait and mobility: Secondary | ICD-10-CM

## 2020-07-09 DIAGNOSIS — M25562 Pain in left knee: Secondary | ICD-10-CM

## 2020-07-09 NOTE — Therapy (Signed)
[facility] [address]  [place], [place], [address] Phone: [phone]   Fax:  [phone]  Physical Therapy Treatment  Patient Details  Name: Andrew Banks MRN: [id number] Date of Birth: 05/29/1950 Referring Provider (PT): Lowella Dandy, MD   Encounter Date: 07/09/2020   PT End of Session - 07/09/20 1529     Visit Number 8    Number of Visits 17    Date for PT Re-Evaluation 07/28/20    Authorization Type Medicare & VA    Authorization - Visit Number 8    Authorization - Number of Visits 15    PT Start Time 3704    PT Stop Time 1527    PT Time Calculation (min) 40 min    Equipment Utilized During Treatment Gait belt    Activity Tolerance Patient tolerated treatment well;Patient limited by pain    Behavior During Therapy WFL for tasks assessed/performed             Past Medical History:  Diagnosis Date   Arthritis    Degenerative disc disease, lumbar    Hypertension    Liver transplanted [facility])    Skin cancer     Past Surgical History:  Procedure Laterality Date   liver transplant      There were no vitals filed for this visit.   Subjective Assessment - 07/09/20 1449     Subjective Bringing in personal TENS unit for instruction. Notes that his pelvic and back was popping during aquatic therapy but it didn't hurt.    Pertinent History skin CA, liver transplant, HTN, lumbar DDD    Diagnostic tests none recent    Patient Stated Goals decrease pain    Currently in Pain? Yes    Pain Score 7     Pain Location Back    Pain Orientation Left;Lower    Pain Descriptors / Indicators Aching    Pain Type Acute pain                               [facility] Adult PT Treatment/Exercise - 07/09/20 0001       Neuro Re-ed    Neuro Re-ed Details  R foot stepping forward/back to targets with 1 UE support15x   cues to increase step length and L weight shift     Lumbar Exercises:  Aerobic   Nustep L5 x 6 min (UEs/LEs)      Knee/Hip Exercises: Standing   Hip Flexion Stengthening;Both;10 reps;Knee bent;2 sets    Hip Flexion Limitations resisted march with yellow TB around toes   c/o L buttock pain with L stabilizing                Balance Exercises - 07/09/20 0001       Balance Exercises: Standing   Tandem Stance Eyes open;1 rep;30 secs   1/2 semi tandem at counter; more challenge with L foot back              PT Education - 07/09/20 1528     Education Details TENS set up and edu on TENS use, electrode placement, precautions, and settings    Person(s) Educated Patient    Methods Explanation;Demonstration;Tactile cues;Verbal cues    Comprehension Verbalized understanding              PT Short Term Goals - 06/17/20 1530       PT SHORT TERM GOAL #  1   Title Patient to be independent with initial HEP.    Time 3    Period Weeks    Status Achieved    Target Date 06/23/20               PT Long Term Goals - 06/15/20 1532       PT LONG TERM GOAL #1   Title Patient to be independent with advanced HEP.    Time 8    Period Weeks    Status On-going      PT LONG TERM GOAL #2   Title Patient to demonstrate B LE strength >/=4/5.    Time 8    Period Weeks    Status On-going      PT LONG TERM GOAL #3   Title Patient to score <15 sec on 5xSTS in order to decrease risk of falls.    Time 8    Period Weeks    Status On-going      PT LONG TERM GOAL #4   Title Patient to score <14 sec on TUG with LRAD in order to decrease risk of falls.    Time 8    Period Weeks    Status On-going      PT LONG TERM GOAL #5   Title Patient to score atleast 45/56 on BERG in order to decrease risk of falls.    Time 8    Period Weeks    Status On-going                   Plan - 07/09/20 1530     Clinical Impression Statement Patient arrived to session with personal TENS unit for instruction. Spent time educating patient on TENS use,  electrode placement, precautions, and settings- patient reported understanding. Patient performed progressive L LE WBing activities with considerable challenge with posterior stepping, more success with anterior stepping. Static balance was performed at counter for safety with good stability with narrow BOS; cueing required to maintain upright stance throughout d/t tendency for crouched posture. Resisted hip strengthening was performed with c/o difficulty and L SIJ pain, however patient agreeable to continue. Encouraged patient ot try heat and TENS at home for pain management at home. Patient reported understanding and without complaints at end of session.    Personal Factors and Comorbidities Age;Comorbidity 3+;Fitness;Past/Current Experience;Time since onset of injury/illness/exacerbation    Comorbidities skin CA, liver transplant, HTN, lumbar DDD    Examination-Activity Limitations Sit;Sleep;Bed Mobility;Bend;Squat;Stairs;Carry;Stand;Toileting;Dressing;Transfers;Hygiene/Grooming;Lift;Locomotion Level;Reach Overhead    Examination-Participation Restrictions Church;Cleaning;Community Activity;Interpersonal Relationship;Laundry;Meal Prep;Occupation;Shop    Stability/Clinical Decision Making Stable/Uncomplicated    Rehab Potential Good    PT Frequency 2x / week    PT Duration 8 weeks    PT Treatment/Interventions ADLs/Self Care Home Management;Cryotherapy;Electrical Stimulation;Iontophoresis 4mg /ml Dexamethasone;Moist Heat;Balance training;Therapeutic exercise;Therapeutic activities;Functional mobility training;Stair training;Gait training;DME Instruction;Ultrasound;Neuromuscular re-education;Patient/family education;Manual techniques;Vasopneumatic Device;Taping;Energy conservation;Dry needling;Passive range of motion;Scar mobilization    PT Next Visit Plan progress balance and strengthening    Consulted and Agree with Plan of Care Patient             Patient will benefit from skilled therapeutic  intervention in order to improve the following deficits and impairments:  Abnormal gait, Hypomobility, Increased edema, Decreased activity tolerance, Decreased strength, Increased fascial restricitons, Pain, Decreased balance, Decreased mobility, Difficulty walking, Increased muscle spasms, Improper body mechanics, Decreased range of motion, Impaired flexibility, Postural dysfunction  Visit Diagnosis: Muscle weakness (generalized)  Chronic left-sided low back pain with left-sided sciatica  Chronic pain of left  knee  Difficulty in walking, not elsewhere classified  Other abnormalities of gait and mobility     Problem List There are no problems to display for this patient.    Janene Harvey, PT, DPT 07/09/20 3:33 PM   [facility] [address]  [place], [place], [address] Phone: [phone]   Fax:  [phone]  Name: Andrew Banks MRN: [id number] Date of Birth: 06-Oct-1950

## 2020-07-13 ENCOUNTER — Encounter: Payer: Non-veteran care | Admitting: Physical Therapy

## 2020-07-14 ENCOUNTER — Ambulatory Visit (HOSPITAL_BASED_OUTPATIENT_CLINIC_OR_DEPARTMENT_OTHER): Payer: Non-veteran care | Admitting: Physical Therapy

## 2020-07-15 ENCOUNTER — Other Ambulatory Visit: Payer: Self-pay

## 2020-07-15 ENCOUNTER — Ambulatory Visit (HOSPITAL_BASED_OUTPATIENT_CLINIC_OR_DEPARTMENT_OTHER): Payer: No Typology Code available for payment source | Admitting: Physical Therapy

## 2020-07-15 DIAGNOSIS — M6281 Muscle weakness (generalized): Secondary | ICD-10-CM | POA: Diagnosis not present

## 2020-07-15 DIAGNOSIS — M25562 Pain in left knee: Secondary | ICD-10-CM

## 2020-07-15 DIAGNOSIS — R2689 Other abnormalities of gait and mobility: Secondary | ICD-10-CM

## 2020-07-15 DIAGNOSIS — R262 Difficulty in walking, not elsewhere classified: Secondary | ICD-10-CM

## 2020-07-15 DIAGNOSIS — G8929 Other chronic pain: Secondary | ICD-10-CM

## 2020-07-15 NOTE — Therapy (Addendum)
[place] [address] [place], [place], [address] Phone: [phone]   Fax:  [phone]  Physical Therapy Treatment  Patient Details  Name: Andrew Banks MRN: [id number] Date of Birth: [birth year] Referring Provider (PT): Lowella Dandy, MD  Subjective I did very well after last treatment.  first time no discomfort after a therapy session.  have been able to try amb some unsupported since that visit. I have felt more stable  Encounter Date: 07/15/2020     Past Medical History:  Diagnosis Date   Arthritis    Degenerative disc disease, lumbar    Hypertension    Liver transplanted [facility])    Skin cancer     Past Surgical History:  Procedure Laterality Date   liver transplant      There were no vitals filed for this visit.      TREATMENT Pt seen for aquatic therapy today.  Treatment took place in water 3.25-4.8 ft in depth at the [facility] pool. Temp of water was 91.  Pt entered/exited the pool via stairs (step to pattern) cga with bilat rail.   Warm up Water walking forward, backward, side stepping x 6 lengths hand held assist.  VC for increased step length, hip/ knee flex and speed. Pt with 4-5 LOB recovered with min cga.   Seated on water bench, submerged 70% Stretching gastroc and hamstring active and passive Strengthening: ankle buoy for resistance knee flex/ext 2 x 10 reps.  Marching 2 x 10 reps   Standing Strengthening hip flex, extension, add and abd x 10 reps.  Hip circles right and left x 5 reps modified to decreased diameter and reps due to hip popping.  Cueing and instruction on core stabilization while completing exercises.  Core str and balance: Planks using kick board 3 x 10 reps.  Resisted core rotation using kick board 180 degrees right to left pushing and pulling kick board held vertical in water 2 x 10 reps Suspended by sqoodle: knees to chest with gentle overpressure by  therapist for LB stretch 3 x 20 seconds.  Knee kick down and jack knife stabilized at hip by therapist x 10 reps for abdominal and erector spinae strengthening, gentle rotation knees to chest for serratus stretch.   Suspended in vertical using noodles/sqoodles: kicking and scissoring, gentle knees to chest engaging abdominals.   Pt requires buoyancy for support and to offload joints with strengthening exercises. Viscosity of the water is needed for resistance of strengthening; water current perturbations provides challenge to standing balance unsupported, requiring increased core activation.                              PT Short Term Goals - 07/16/20 1526       PT SHORT TERM GOAL #1   Title Patient to be independent with initial HEP.    Time 3    Period Weeks    Status Achieved    Target Date 06/23/20               PT Long Term Goals - 07/16/20 1526       PT LONG TERM GOAL #1   Title Patient to be independent with advanced HEP.    Time 8    Period Weeks    Status Partially Met   met for current   Target Date 09/10/20      PT LONG TERM GOAL #2   Title Patient  to demonstrate B LE strength >/=4/5.    Time 8    Period Weeks    Status Partially Met   improving   Target Date 09/10/20      PT LONG TERM GOAL #3   Title Patient to score <15 sec on 5xSTS in order to decrease risk of falls.    Time 8    Period Weeks    Status On-going   16.23 sec no UEs   Target Date 09/10/20      PT LONG TERM GOAL #4   Title Patient to score <14 sec on TUG with LRAD in order to decrease risk of falls.    Time 8    Period Weeks    Status Achieved      PT LONG TERM GOAL #5   Title Patient to score atleast 45/56 on BERG in order to decrease risk of falls.    Time 8    Period Weeks    Status On-going   deferred until next session   Target Date 09/10/20                    Patient will benefit from skilled therapeutic intervention in order to improve  the following deficits and impairments:  Abnormal gait, Hypomobility, Increased edema, Decreased activity tolerance, Decreased strength, Increased fascial restricitons, Pain, Decreased balance, Decreased mobility, Difficulty walking, Increased muscle spasms, Improper body mechanics, Decreased range of motion, Impaired flexibility, Postural dysfunction  Visit Diagnosis: Muscle weakness (generalized)  Chronic left-sided low back pain with left-sided sciatica  Chronic pain of left knee  Difficulty in walking, not elsewhere classified  Other abnormalities of gait and mobility  Clinical Assessment Pt reports fatigue from visit to [place] but states he had no residual discomfort tireness from last aquatic visit.  Focus of treatment is increased strengthening and stretching. Able to get LB stretching with knees to chest and good activation of abdomonal and erector spinae ms suspended in vertical in aquatic environment.   Problem List There are no problems to display for this patient.   [name] MPT 08/12/2020, 1:11 PM  [facility] [address] [place], [place], [address] Phone: [phone]   Fax:  [phone]  Name: Andrew Banks MRN: [id number] Date of Birth: Nov 17, 1950  Addended Andrew Banks) Andrew Banks MPT 08/12/20

## 2020-07-16 ENCOUNTER — Encounter (HOSPITAL_BASED_OUTPATIENT_CLINIC_OR_DEPARTMENT_OTHER): Payer: Self-pay | Admitting: Physical Therapy

## 2020-07-16 ENCOUNTER — Encounter: Payer: Self-pay | Admitting: Physical Therapy

## 2020-07-16 ENCOUNTER — Ambulatory Visit: Payer: No Typology Code available for payment source | Admitting: Physical Therapy

## 2020-07-16 DIAGNOSIS — M25562 Pain in left knee: Secondary | ICD-10-CM

## 2020-07-16 DIAGNOSIS — M6281 Muscle weakness (generalized): Secondary | ICD-10-CM | POA: Diagnosis not present

## 2020-07-16 DIAGNOSIS — R262 Difficulty in walking, not elsewhere classified: Secondary | ICD-10-CM

## 2020-07-16 DIAGNOSIS — G8929 Other chronic pain: Secondary | ICD-10-CM

## 2020-07-16 NOTE — Therapy (Signed)
[place] [facility] [place] [facility] [address]  [place], [place], [address] Phone: [phone]   Fax:  [phone]  Physical Therapy Progress Note  Patient Details  Name: Andrew Banks MRN: [id number] Date of Birth: [birth year] Referring Provider (PT): Lowella Dandy, MD   Progress Note Reporting Period 06/02/20 to 07/16/20  See note below for Objective Data and Assessment of Progress/Goals.      Encounter Date: 07/16/2020   PT End of Session - 07/16/20 1523     Visit Number 10    Number of Visits 26    Date for PT Re-Evaluation 09/10/20    Authorization Type Medicare & VA    Authorization - Visit Number 10    Authorization - Number of Visits 30    PT Start Time 9675    PT Stop Time 9163   ended session early d/t fatigue/pain   PT Time Calculation (min) 39 min    Activity Tolerance Patient tolerated treatment well;Patient limited by pain;Patient limited by fatigue    Behavior During Therapy [facility] for tasks assessed/performed             Past Medical History:  Diagnosis Date   Arthritis    Degenerative disc disease, lumbar    Hypertension    Liver transplanted [facility])    Skin cancer     Past Surgical History:  Procedure Laterality Date   liver transplant      There were no vitals filed for this visit.   Subjective Assessment - 07/16/20 1445     Subjective Had to take a trip to the [place] this week and is having a lot of fatigue and pain.    Pertinent History skin CA, liver transplant, HTN, lumbar DDD    Diagnostic tests none recent    Patient Stated Goals decrease pain    Currently in Pain? Yes    Pain Score 8     Pain Location Back    Pain Orientation Left;Lower;Right    Pain Descriptors / Indicators Aching    Pain Type Acute pain    Pain Score 6    Pain Location Knee    Pain Orientation Left    Pain Descriptors / Indicators Burning    Pain Radiating Towards radiating to lateral ankle                 [facility] PT Assessment - 07/16/20 1449       Assessment   Medical Diagnosis Closed fx of distal end of L femur    Referring Provider (PT) Lowella Dandy, MD    Onset Date/Surgical Date 10/02/19      Strength   Right Hip Flexion 4+/5    Right Hip ABduction 4/5    Right Hip ADduction 4/5    Left Hip Flexion 4+/5    Left Hip ABduction 4/5    Left Hip ADduction 4/5    Right Knee Flexion 4/5    Right Knee Extension 4+/5    Left Knee Flexion 4/5    Left Knee Extension 4/5    Right Ankle Dorsiflexion 4+/5    Right Ankle Plantar Flexion 4/5    Right Ankle Inversion 4/5    Right Ankle Eversion 4/5    Left Ankle Dorsiflexion 4/5    Left Ankle Plantar Flexion 3-/5    Left Ankle Inversion 3+/5    Left Ankle Eversion 3+/5      Standardized Balance Assessment   Standardized Balance Assessment Five Times  Sit to Stand    Five times sit to stand comments  16.23 sec no UEs      Timed Up and Go Test   Normal TUG (seconds) 13.89   4ww                          [facility] Adult PT Treatment/Exercise - 07/16/20 0001       Lumbar Exercises: Stretches   Single Knee to Chest Stretch Right;Left;1 rep;30 seconds    Single Knee to Chest Stretch Limitations with strap   pt chose to extend opposite LE   Hip Flexor Stretch Right;Left;2 reps;30 seconds    Hip Flexor Stretch Limitations mod thomas with foot on 6" step R, 4" step L    Pelvic Tilt 20 reps    Pelvic Tilt Limitations sitting; ROM to tolerance    Other Lumbar Stretch Exercise prayer stretch forward/R/L 5x3" with green pball   cues to perform to tolerance     Lumbar Exercises: Aerobic   Nustep L4 x 6 min (UEs/LEs)                    PT Education - 07/16/20 1522     Education Details discussion on objective progress and testing today    Person(s) Educated Patient    Methods Explanation    Comprehension Verbalized understanding              PT Short Term Goals - 07/16/20 1526       PT  SHORT TERM GOAL #1   Title Patient to be independent with initial HEP.    Time 3    Period Weeks    Status Achieved    Target Date 06/23/20               PT Long Term Goals - 07/16/20 1526       PT LONG TERM GOAL #1   Title Patient to be independent with advanced HEP.    Time 8    Period Weeks    Status Partially Met   met for current   Target Date 09/10/20      PT LONG TERM GOAL #2   Title Patient to demonstrate B LE strength >/=4/5.    Time 8    Period Weeks    Status Partially Met   improving   Target Date 09/10/20      PT LONG TERM GOAL #3   Title Patient to score <15 sec on 5xSTS in order to decrease risk of falls.    Time 8    Period Weeks    Status On-going   16.23 sec no UEs   Target Date 09/10/20      PT LONG TERM GOAL #4   Title Patient to score <14 sec on TUG with LRAD in order to decrease risk of falls.    Time 8    Period Weeks    Status Achieved      PT LONG TERM GOAL #5   Title Patient to score atleast 45/56 on BERG in order to decrease risk of falls.    Time 8    Period Weeks    Status On-going   deferred until next session   Target Date 09/10/20                   Plan - 07/16/20 1525     Clinical Impression Statement Patient arrived to session with report of  increased fatigue and pain d/t taking a trip to the [place] this week. Strength testing revealed excellent improvement in B hips, knees, and ankles. However, with strength deficits still remaining in L ankle. Patient has demonstrated considerable improvement in time taken to complete 5xSTS and TUG testing today. TUG score demonstrated a decreased risk of falls. Deferred Berg today d/t patient's pain levels and fatigue. Remainder of session focused on gentle lumbopelvic ROM and LE stretching. Patient demonstrated considerable tightness in the hip flexors, contributing to anterior trunk lean in standing/ambulation. Plan to continue hip flexor stretching/glute strengthening to address this.  Ended session early today per patient's request d/t pain and fatigue. Patient is demonstrating excellent progress towards goals. Would benefit from additional skilled PT services 2x/week for 8 weeks to address remaining limitations.    Personal Factors and Comorbidities Age;Comorbidity 3+;Fitness;Past/Current Experience;Time since onset of injury/illness/exacerbation    Comorbidities skin CA, liver transplant, HTN, lumbar DDD    Examination-Activity Limitations Sit;Sleep;Bed Mobility;Bend;Squat;Stairs;Carry;Stand;Toileting;Dressing;Transfers;Hygiene/Grooming;Lift;Locomotion Level;Reach Overhead    Examination-Participation Restrictions Church;Cleaning;Community Activity;Interpersonal Relationship;Laundry;Meal Prep;Occupation;Shop    Stability/Clinical Decision Making Stable/Uncomplicated    Rehab Potential Good    PT Frequency 2x / week    PT Duration 8 weeks    PT Treatment/Interventions ADLs/Self Care Home Management;Cryotherapy;Electrical Stimulation;Iontophoresis 60m/ml Dexamethasone;Moist Heat;Balance training;Therapeutic exercise;Therapeutic activities;Functional mobility training;Stair training;Gait training;DME Instruction;Ultrasound;Neuromuscular re-education;Patient/family education;Manual techniques;Vasopneumatic Device;Taping;Energy conservation;Dry needling;Passive range of motion;Scar mobilization    PT Next Visit Plan progress balance and strengthening    Consulted and Agree with Plan of Care Patient             Patient will benefit from skilled therapeutic intervention in order to improve the following deficits and impairments:  Abnormal gait, Hypomobility, Increased edema, Decreased activity tolerance, Decreased strength, Increased fascial restricitons, Pain, Decreased balance, Decreased mobility, Difficulty walking, Increased muscle spasms, Improper body mechanics, Decreased range of motion, Impaired flexibility, Postural dysfunction  Visit Diagnosis: Muscle weakness  (generalized)  Chronic left-sided low back pain with left-sided sciatica  Chronic pain of left knee  Difficulty in walking, not elsewhere classified     Problem List There are no problems to display for this patient.    YJanene Harvey PT, DPT 07/16/20 3:29 PM    [facility] [address] [place] [address]: [phone]  Fax:  [phone] Name: Andrew BotelhoMRN: [id number] of Birth: [birth year]

## 2020-07-21 ENCOUNTER — Encounter (HOSPITAL_BASED_OUTPATIENT_CLINIC_OR_DEPARTMENT_OTHER): Payer: Self-pay | Admitting: Physical Therapy

## 2020-07-21 ENCOUNTER — Ambulatory Visit (HOSPITAL_BASED_OUTPATIENT_CLINIC_OR_DEPARTMENT_OTHER): Payer: No Typology Code available for payment source | Admitting: Physical Therapy

## 2020-07-21 ENCOUNTER — Ambulatory Visit (HOSPITAL_BASED_OUTPATIENT_CLINIC_OR_DEPARTMENT_OTHER): Payer: Non-veteran care | Admitting: Physical Therapy

## 2020-07-21 ENCOUNTER — Other Ambulatory Visit: Payer: Self-pay

## 2020-07-21 DIAGNOSIS — R2689 Other abnormalities of gait and mobility: Secondary | ICD-10-CM

## 2020-07-21 DIAGNOSIS — M6281 Muscle weakness (generalized): Secondary | ICD-10-CM

## 2020-07-21 DIAGNOSIS — G8929 Other chronic pain: Secondary | ICD-10-CM

## 2020-07-21 DIAGNOSIS — R262 Difficulty in walking, not elsewhere classified: Secondary | ICD-10-CM

## 2020-07-21 NOTE — Therapy (Addendum)
[place], [place], [address] Phone: [phone]   Fax:  [phone]  Physical Therapy Treatment  Patient Details  Name: Andrew Banks MRN: [id number] Date of Birth: 09/06/1950 Referring Provider (PT): Lowella Dandy, MD   Encounter Date: 07/21/2020  Subjective I have had a little bit of back pain for the last few days.  It was tweaked a little bit by last session with you and then also with on land session.  Pain Right and left LB 6/10, intermittent ache.  Past Medical History:  Diagnosis Date   Arthritis    Degenerative disc disease, lumbar    Hypertension    Liver transplanted [facility])    Skin cancer     Past Surgical History:  Procedure Laterality Date   liver transplant      There were no vitals filed for this visit.   TREATMENT Pt seen for aquatic therapy today.  Treatment took place in water 3.25-4.8 ft in depth at the [facility] pool. Temp of water was 91.  Pt entered/exited the pool via stairs (step to pattern) cga with bilat rail.   Warm up Water walking forward, backward, side stepping x 6 lengths unassisted.  VC for increased step length, hip/ knee flex and speed.    Seated on water bench, submerged 70% Stretching gastroc and hamstring active and passive Strengthening: ankle buoy for resistance knee flex/ext 2 x 10 reps.     Standing Strengthening hip flex, extension, add and abd x 10 reps.  Hip circles right and left x 10 reps. Cueing and instruction on core stabilization while completing exercises. Core str and balance: Planks using kick board 3 x 10 reps.  Resisted core rotation using kick board 180 degrees right to left pushing and pulling kick board held vertical in water 2 x 10 reps Suspended by squoodle vertically: knees to chest for LB stretch and core strengthening 3 x 20 seconds added rotation right to left with knees at chest with challenge to maintain  balance without therapist assisting 3x 10 reps.  Knee kick down and jack knife stabilized at hip by therapist x 10 reps for abdominal and erector spinae strengthening, gentle rotation knees to chest for serratus stretch. Flutter kicking and scissoring x 10 mins Sitting on squoodle: after gaining balance pt using ue to propel back and forth in pool x 10 mins.   Pt requires buoyancy for support and to offload joints with strengthening exercises. Viscosity of the water is needed for resistance of strengthening; water current perturbations provides challenge to standing balance unsupported, requiring increased core activation.                                PT Short Term Goals - 07/16/20 1526       PT SHORT TERM GOAL #1   Title Patient to be independent with initial HEP.    Time 3    Period Weeks    Status Achieved    Target Date 06/23/20               PT Long Term Goals - 07/16/20 1526       PT LONG TERM GOAL #1   Title Patient to be independent with advanced HEP.    Time 8    Period Weeks    Status Partially Met   met for current   Target Date 09/10/20  PT LONG TERM GOAL #2   Title Patient to demonstrate B LE strength >/=4/5.    Time 8    Period Weeks    Status Partially Met   improving   Target Date 09/10/20      PT LONG TERM GOAL #3   Title Patient to score <15 sec on 5xSTS in order to decrease risk of falls.    Time 8    Period Weeks    Status On-going   16.23 sec no UEs   Target Date 09/10/20      PT LONG TERM GOAL #4   Title Patient to score <14 sec on TUG with LRAD in order to decrease risk of falls.    Time 8    Period Weeks    Status Achieved      PT LONG TERM GOAL #5   Title Patient to score atleast 45/56 on BERG in order to decrease risk of falls.    Time 8    Period Weeks    Status On-going   deferred until next session   Target Date 09/10/20                    Patient will benefit from skilled  therapeutic intervention in order to improve the following deficits and impairments:  Abnormal gait, Hypomobility, Increased edema, Decreased activity tolerance, Decreased strength, Increased fascial restricitons, Pain, Decreased balance, Decreased mobility, Difficulty walking, Increased muscle spasms, Improper body mechanics, Decreased range of motion, Impaired flexibility, Postural dysfunction  Visit Diagnosis: Muscle weakness (generalized)  Chronic left-sided low back pain with left-sided sciatica  Chronic pain of left knee  Difficulty in walking, not elsewhere classified  Other abnormalities of gait and mobility  Clinical Assessment Pt with some lingering LB discomfort from events last week.  Focus of treatment was to strengthen, stretch and challenge aerobic capacity as pain free as able.  Pt completes most challenges at minimal 75% submerged.  Pt does report throughout that he had very little lb discomfort.  He was successfully challenged in all areas gaining max benefit from therapy  Problem List There are no problems to display for this patient.   [name] MPT 08/12/2020, 1:15 PM  [place] [address] [place], [place], [address] Phone: [phone]   Fax:  [phone]  Name: Andrew Banks MRN: [id number] Date of Birth: January 03, 1951   Addended [name]) Athelene Hursey MPT 08/12/20

## 2020-07-28 ENCOUNTER — Ambulatory Visit (HOSPITAL_BASED_OUTPATIENT_CLINIC_OR_DEPARTMENT_OTHER): Payer: Non-veteran care | Admitting: Physical Therapy

## 2020-07-28 ENCOUNTER — Ambulatory Visit (HOSPITAL_BASED_OUTPATIENT_CLINIC_OR_DEPARTMENT_OTHER): Payer: No Typology Code available for payment source | Admitting: Physical Therapy

## 2020-07-28 ENCOUNTER — Other Ambulatory Visit: Payer: Self-pay

## 2020-07-28 DIAGNOSIS — G8929 Other chronic pain: Secondary | ICD-10-CM

## 2020-07-28 DIAGNOSIS — M6281 Muscle weakness (generalized): Secondary | ICD-10-CM

## 2020-07-28 DIAGNOSIS — R2689 Other abnormalities of gait and mobility: Secondary | ICD-10-CM

## 2020-07-28 DIAGNOSIS — R262 Difficulty in walking, not elsewhere classified: Secondary | ICD-10-CM

## 2020-07-28 NOTE — Therapy (Signed)
[facility] [address] [place], [place], [address] Phone: [phone]   Fax:  [phone]  Physical Therapy Treatment  Patient Details  Name: Andrew Banks MRN: [id number] Date of Birth: March 03, 1950 Referring Provider (PT): Lowella Dandy, MD   Encounter Date: 07/28/2020     Past Medical History:  Diagnosis Date   Arthritis    Degenerative disc disease, lumbar    Hypertension    Liver transplanted [facility])    Skin cancer     Past Surgical History:  Procedure Laterality Date   liver transplant      There were no vitals filed for this visit.   Subjective Assessment - 07/30/20 0956     Subjective A little late.  Left knee and hip a little soar, may have overdone it    Currently in Pain? Yes    Pain Score 4     Pain Location Back    Pain Orientation Right;Left;Lower    Pain Descriptors / Indicators Aching    Pain Type Acute pain    Pain Radiating Towards na    Pain Onset More than a month ago    Pain Score 6    Pain Location Knee    Pain Orientation Left    Pain Descriptors / Indicators Aching    Pain Type Chronic pain    Pain Radiating Towards left hip    Pain Onset More than a month ago    Pain Frequency Intermittent    Pain Score 6            TREATMENT Pt seen for aquatic therapy today.  Treatment took place in water 3.25-4.8 ft in depth at the [facility] pool. Temp of water was 91.  Pt entered/exited the pool via stairs (step to pattern) cga with bilat rail.   Seated on water bench, submerged 70% Stretching gastroc and hamstring active and passive Strengthening: ankle buoy for resistance knee flex/ext 2 x 10 reps.     Standing Using ankle cuff buoys: hip abduction/add and hip extension 2 x 10 reps cuing for tight core throughout.   Suspended vertically knees to chest for LB stretch facilitates by therapist with gentle overpressure 3 x 20 secs pt instructed to hold to thigh  instead of knee core strengthening:  rotation right to left with knees to chest. Knee kick down and jack knife stabilized at hip by therapist x 10 reps for abdominal and erector spinae strengthening. Knee flex/ext and hip extension using ankle cuff buoys 2 x 10 reps Flutter kicking and scissoring 4 lengths of pool for aerobic capacity challenges and gentle ROM and strengthening of knees and hips   Pt requires buoyancy for support and to offload joints with strengthening exercises. Viscosity of the water is needed for resistance of strengthening; water current perturbations provides challenge to standing balance unsupported, requiring increased core activation.                               PT Short Term Goals - 07/16/20 1526       PT SHORT TERM GOAL #1   Title Patient to be independent with initial HEP.    Time 3    Period Weeks    Status Achieved    Target Date 06/23/20               PT Long Term Goals - 07/16/20 1526       PT  LONG TERM GOAL #1   Title Patient to be independent with advanced HEP.    Time 8    Period Weeks    Status Partially Met   met for current   Target Date 09/10/20      PT LONG TERM GOAL #2   Title Patient to demonstrate B LE strength >/=4/5.    Time 8    Period Weeks    Status Partially Met   improving   Target Date 09/10/20      PT LONG TERM GOAL #3   Title Patient to score <15 sec on 5xSTS in order to decrease risk of falls.    Time 8    Period Weeks    Status On-going   16.23 sec no UEs   Target Date 09/10/20      PT LONG TERM GOAL #4   Title Patient to score <14 sec on TUG with LRAD in order to decrease risk of falls.    Time 8    Period Weeks    Status Achieved      PT LONG TERM GOAL #5   Title Patient to score atleast 45/56 on BERG in order to decrease risk of falls.    Time 8    Period Weeks    Status On-going   deferred until next session   Target Date 09/10/20            Pt late. Focus on  right knee and hip discomfort through stretching and strengtheing. Good aerobic capacity challenges while focusing on knee and hip. Pt reports no discomfort in areas while in pool just stiffness       Plan - 07/30/20 1011     Personal Factors and Comorbidities Age;Comorbidity 3+;Fitness;Past/Current Experience;Time since onset of injury/illness/exacerbation    Examination-Activity Limitations Sit;Sleep;Bed Mobility;Bend;Squat;Stairs;Carry;Stand;Toileting;Dressing;Transfers;Hygiene/Grooming;Lift;Locomotion Level;Reach Overhead    Examination-Participation Restrictions Church;Cleaning;Community Activity;Interpersonal Relationship;Laundry;Meal Prep;Occupation;Shop    Stability/Clinical Decision Making Stable/Uncomplicated    Clinical Decision Making Low    Rehab Potential Good    PT Frequency 2x / week    PT Duration 8 weeks    PT Treatment/Interventions ADLs/Self Care Home Management;Cryotherapy;Electrical Stimulation;Iontophoresis 90m/ml Dexamethasone;Moist Heat;Balance training;Therapeutic exercise;Therapeutic activities;Functional mobility training;Stair training;Gait training;DME Instruction;Ultrasound;Neuromuscular re-education;Patient/family education;Manual techniques;Vasopneumatic Device;Taping;Energy conservation;Dry needling;Passive range of motion;Scar mobilization              Patient will benefit from skilled therapeutic intervention in order to improve the following deficits and impairments:  Abnormal gait, Hypomobility, Increased edema, Decreased activity tolerance, Decreased strength, Increased fascial restricitons, Pain, Decreased balance, Decreased mobility, Difficulty walking, Increased muscle spasms, Improper body mechanics, Decreased range of motion, Impaired flexibility, Postural dysfunction  Visit Diagnosis: Muscle weakness (generalized)  Chronic left-sided low back pain with left-sided sciatica  Chronic pain of left knee  Difficulty in walking, not elsewhere  classified  Other abnormalities of gait and mobility     Problem List There are no problems to display for this patient.   MVedia Pereyra MPT 07/30/2020, 10:13 AM  [facility]ls[place] [address]Phone: [phone]  Fax:  [phone] Name: Andrew DaleoMRN: [id number] of Birth: [birth year]

## 2020-07-30 ENCOUNTER — Ambulatory Visit: Payer: No Typology Code available for payment source | Admitting: Physical Therapy

## 2020-07-30 ENCOUNTER — Other Ambulatory Visit: Payer: Self-pay

## 2020-07-30 ENCOUNTER — Encounter: Payer: Self-pay | Admitting: Physical Therapy

## 2020-07-30 DIAGNOSIS — M25562 Pain in left knee: Secondary | ICD-10-CM

## 2020-07-30 DIAGNOSIS — M6281 Muscle weakness (generalized): Secondary | ICD-10-CM | POA: Diagnosis not present

## 2020-07-30 DIAGNOSIS — R2689 Other abnormalities of gait and mobility: Secondary | ICD-10-CM

## 2020-07-30 DIAGNOSIS — G8929 Other chronic pain: Secondary | ICD-10-CM

## 2020-07-30 DIAGNOSIS — M5442 Lumbago with sciatica, left side: Secondary | ICD-10-CM

## 2020-07-30 DIAGNOSIS — R262 Difficulty in walking, not elsewhere classified: Secondary | ICD-10-CM

## 2020-07-30 NOTE — Therapy (Signed)
[facility] [address]  [place], [place], [address] Phone: [phone]   Fax:  [phone]  Physical Therapy Treatment  Patient Details  Name: Andrew Banks MRN: [id number] Date of Birth: 1950-06-24 Referring Provider (PT): Lowella Dandy, MD   Encounter Date: 07/30/2020   PT End of Session - 07/30/20 1526     Visit Number 13    Number of Visits 26    Date for PT Re-Evaluation 09/10/20    Authorization Type Medicare & VA    PT Start Time 1444    PT Stop Time 1527    PT Time Calculation (min) 43 min    Equipment Utilized During Treatment Gait belt    Activity Tolerance Patient tolerated treatment well    Behavior During Therapy WFL for tasks assessed/performed             Past Medical History:  Diagnosis Date   Arthritis    Degenerative disc disease, lumbar    Hypertension    [facility])    Skin cancer     Past Surgical History:  Procedure Laterality Date   liver transplant      There were no vitals filed for this visit.   Subjective Assessment - 07/30/20 1441     Subjective Got some new shoes. Aquatic therapy is kind of tough but going welll.    Pertinent History skin CA, liver transplant, HTN, lumbar DDD    Diagnostic tests none recent    Patient Stated Goals decrease pain    Currently in Pain? Yes    Pain Score 6     Pain Location Back    Pain Orientation Left    Pain Descriptors / Indicators Aching    Pain Type Chronic pain    Pain Radiating Towards down the L LE                [facility] PT Assessment - 07/30/20 1446       Berg Balance Test   Sit to Stand Able to stand without using hands and stabilize independently    Standing Unsupported Able to stand safely 2 minutes    Sitting with Back Unsupported but Feet Supported on Floor or Stool Able to sit safely and securely 2 minutes    Stand to Sit Sits safely with minimal use of hands    Transfers Able to  transfer safely, minor use of hands    Standing Unsupported with Eyes Closed Able to stand 10 seconds safely    Standing Unsupported with Feet Together Able to place feet together independently and stand 1 minute safely    From Standing, Reach Forward with Outstretched Arm Can reach forward >12 cm safely (5")    From Standing Position, Pick up Object from Floor Able to pick up shoe safely and easily    From Standing Position, Turn to Look Behind Over each Shoulder Looks behind one side only/other side shows less weight shift    Turn 360 Degrees Able to turn 360 degrees safely but slowly    Standing Unsupported, Alternately Place Feet on Step/Stool Able to stand independently and complete 8 steps >20 seconds    Standing Unsupported, One Foot in [employer] balance while stepping or standing    Standing on One Leg Unable to try or needs assist to prevent fall    Total Score 43  [facility] Adult PT Treatment/Exercise - 07/30/20 0001       Neuro Re-ed    Neuro Re-ed Details  standing with meter stick behind back + trunk rotation to targets 2x10; L wt shift + toe tap on cone 10x      Knee/Hip Exercises: Standing   Heel Raises Both;1 set;10 reps    Heel Raises Limitations assisted heel raise with lat pulldown machine   minimal heel raise d/t weakness                Balance Exercises - 07/30/20 0001       Balance Exercises: Standing   Standing Eyes Opened 30 secs;3 reps   on airex   Tandem Stance Eyes open;2 reps;30 secs   3.4 tandem   Other Standing Exercises Comments anterior/posterior weight shifts 3x30"on airex   on airex; difficulty controlling LOB with anterior wt shift                PT Short Term Goals - 07/16/20 1526       PT SHORT TERM GOAL #1   Title Patient to be independent with initial HEP.    Time 3    Period Weeks    Status Achieved    Target Date 06/23/20               PT Long Term Goals - 07/16/20 1526        PT LONG TERM GOAL #1   Title Patient to be independent with advanced HEP.    Time 8    Period Weeks    Status Partially Met   met for current   Target Date 09/10/20      PT LONG TERM GOAL #2   Title Patient to demonstrate B LE strength >/=4/5.    Time 8    Period Weeks    Status Partially Met   improving   Target Date 09/10/20      PT LONG TERM GOAL #3   Title Patient to score <15 sec on 5xSTS in order to decrease risk of falls.    Time 8    Period Weeks    Status On-going   16.23 sec no UEs   Target Date 09/10/20      PT LONG TERM GOAL #4   Title Patient to score <14 sec on TUG with LRAD in order to decrease risk of falls.    Time 8    Period Weeks    Status Achieved      PT LONG TERM GOAL #5   Title Patient to score atleast 45/56 on BERG in order to decrease risk of falls.    Time 8    Period Weeks    Status On-going   deferred until next session   Target Date 09/10/20                   Plan - 07/30/20 1527     Clinical Impression Statement Patient arrived to session without new complaints. Re-assessed Andrew Banks, which revealed improvement to 43/56 today. Most challenge was demonstrated in standing trunk rotation and standing with narrow BOS. Patient seemed to be limited by fear of falling and hesitancy with more challenging tasks today. Worked on static and dynamic balance on foam with patient demonstrating difficulty controlling LOB with anterior wt shift d/t calf weakness. Attempted assisted standing heel raises which was limited d/t weakness. Continued working on L weight shifting- patient responded well to manual cueing today. Ended session without  complaints. Patient progressing well towards goals.    Personal Factors and Comorbidities Age;Comorbidity 3+;Fitness;Past/Current Experience;Time since onset of injury/illness/exacerbation    Comorbidities skin CA, liver transplant, HTN, lumbar DDD    Examination-Activity Limitations Sit;Sleep;Bed  Mobility;Bend;Squat;Stairs;Carry;Stand;Toileting;Dressing;Transfers;Hygiene/Grooming;Lift;Locomotion Level;Reach Overhead    Examination-Participation Restrictions Church;Cleaning;Community Activity;Interpersonal Relationship;Laundry;Meal Prep;Occupation;Shop    Stability/Clinical Decision Making Stable/Uncomplicated    Rehab Potential Good    PT Frequency 2x / week    PT Treatment/Interventions ADLs/Self Care Home Management;Cryotherapy;Electrical Stimulation;Iontophoresis 32m/ml Dexamethasone;Moist Heat;Balance training;Therapeutic exercise;Therapeutic activities;Functional mobility training;Stair training;Gait training;DME Instruction;Ultrasound;Neuromuscular re-education;Patient/family education;Manual techniques;Vasopneumatic Device;Taping;Energy conservation;Dry needling;Passive range of motion;Scar mobilization    PT Next Visit Plan progress balance and strengthening    Consulted and Agree with Plan of Care Patient             Patient will benefit from skilled therapeutic intervention in order to improve the following deficits and impairments:  Abnormal gait, Hypomobility, Increased edema, Decreased activity tolerance, Decreased strength, Increased fascial restricitons, Pain, Decreased balance, Decreased mobility, Difficulty walking, Increased muscle spasms, Improper body mechanics, Decreased range of motion, Impaired flexibility, Postural dysfunction  Visit Diagnosis: Muscle weakness (generalized)  Chronic left-sided low back pain with left-sided sciatica  Chronic pain of left knee  Difficulty in walking, not elsewhere classified  Other abnormalities of gait and mobility     Problem List There are no problems to display for this patient.   YJanene Harvey PT, DPT 07/30/20 3:31 PM    [facility] [address]Phone: [phone]  Fax:  [phone] Name: HChayne BaumgartMRN: [id number] of Birth: [birth year]

## 2020-08-04 ENCOUNTER — Ambulatory Visit (HOSPITAL_BASED_OUTPATIENT_CLINIC_OR_DEPARTMENT_OTHER): Payer: Non-veteran care | Admitting: Physical Therapy

## 2020-08-06 ENCOUNTER — Ambulatory Visit: Payer: No Typology Code available for payment source | Attending: General Practice | Admitting: Physical Therapy

## 2020-08-06 ENCOUNTER — Other Ambulatory Visit: Payer: Self-pay

## 2020-08-06 DIAGNOSIS — M6281 Muscle weakness (generalized): Secondary | ICD-10-CM | POA: Diagnosis not present

## 2020-08-06 DIAGNOSIS — M5442 Lumbago with sciatica, left side: Secondary | ICD-10-CM | POA: Diagnosis present

## 2020-08-06 DIAGNOSIS — R262 Difficulty in walking, not elsewhere classified: Secondary | ICD-10-CM

## 2020-08-06 DIAGNOSIS — G8929 Other chronic pain: Secondary | ICD-10-CM | POA: Diagnosis present

## 2020-08-06 DIAGNOSIS — R2689 Other abnormalities of gait and mobility: Secondary | ICD-10-CM | POA: Insufficient documentation

## 2020-08-06 DIAGNOSIS — M25562 Pain in left knee: Secondary | ICD-10-CM | POA: Insufficient documentation

## 2020-08-06 NOTE — Therapy (Signed)
[facility] [address]  [place], [place], [address] Phone: [phone]   Fax:  [phone]  Physical Therapy Treatment  Patient Details  Name: Andrew Banks MRN: [id number] Date of Birth: 08/29/1950 Referring Provider (PT): Lowella Dandy, MD   Encounter Date: 08/06/2020   PT End of Session - 08/06/20 1533     Visit Number 14    Number of Visits 26    Date for PT Re-Evaluation 09/10/20    Authorization Type Medicare & VA    Authorization - Visit Number 11    Authorization - Number of Visits 30    PT Start Time 2409    PT Stop Time 1535   moist heat   PT Time Calculation (min) 49 min    Activity Tolerance Patient tolerated treatment well;Patient limited by pain    Behavior During Therapy [facility] for tasks assessed/performed             Past Medical History:  Diagnosis Date   Arthritis    Degenerative disc disease, lumbar    Hypertension    Liver transplanted [facility])    Skin cancer     Past Surgical History:  Procedure Laterality Date   liver transplant      There were no vitals filed for this visit.   Subjective Assessment - 08/06/20 1448     Subjective Had a lot of pain in the L hip and knee after last session. "Did not start letting up until yesterday." Noticed some swelling in the knee.    Pertinent History skin CA, liver transplant, HTN, lumbar DDD    Diagnostic tests none recent    Patient Stated Goals decrease pain    Currently in Pain? Yes    Pain Score 7     Pain Location Hip    Pain Orientation Left;Lateral;Posterior    Pain Descriptors / Indicators Aching    Pain Type Chronic pain    Pain Score 7    Pain Location Knee    Pain Orientation Left    Pain Descriptors / Indicators Aching    Pain Type Chronic pain                               [facility] Adult PT Treatment/Exercise - 08/06/20 0001       Self-Care   Self-Care Other Self-Care Comments    Other  Self-Care Comments  edu, practice, demo using L buttock self-STM with ball on wall for improved pain      Lumbar Exercises: Aerobic   Nustep L5 x 6 min (UEs/LEs)      Knee/Hip Exercises: Stretches   Piriformis Stretch Left;2 reps;30 seconds    Piriformis Stretch Limitations KTOS with strap   cues to avoid pushing into pain   Other Knee/Hip Stretches L fig 4 in supine 2x30"   cues to avoid pushing into pain     Modalities   Modalities Moist Heat      Moist Heat Therapy   Number Minutes Moist Heat 10 Minutes    Moist Heat Location --   L buttock     Manual Therapy   Manual Therapy Soft tissue mobilization;Myofascial release    Manual therapy comments R sidelying    Soft tissue mobilization STM to L proximal, lateral glutes, TFL and piriformis    Myofascial Release manual TPR to L proximal, lateral glutes and piriformis   large palpable  and painful trigger points throughout with excellent twitch response in medial piriformis                   PT Education - 08/06/20 1532     Education Details update to HEP; edu on benefits of massage therapy for pain relief    Person(s) Educated Patient    Methods Explanation;Demonstration;Tactile cues;Verbal cues;Handout    Comprehension Verbalized understanding;Returned demonstration              PT Short Term Goals - 07/16/20 1526       PT SHORT TERM GOAL #1   Title Patient to be independent with initial HEP.    Time 3    Period Weeks    Status Achieved    Target Date 06/23/20               PT Long Term Goals - 07/16/20 1526       PT LONG TERM GOAL #1   Title Patient to be independent with advanced HEP.    Time 8    Period Weeks    Status Partially Met   met for current   Target Date 09/10/20      PT LONG TERM GOAL #2   Title Patient to demonstrate B LE strength >/=4/5.    Time 8    Period Weeks    Status Partially Met   improving   Target Date 09/10/20      PT LONG TERM GOAL #3   Title Patient to  score <15 sec on 5xSTS in order to decrease risk of falls.    Time 8    Period Weeks    Status On-going   16.23 sec no UEs   Target Date 09/10/20      PT LONG TERM GOAL #4   Title Patient to score <14 sec on TUG with LRAD in order to decrease risk of falls.    Time 8    Period Weeks    Status Achieved      PT LONG TERM GOAL #5   Title Patient to score atleast 45/56 on BERG in order to decrease risk of falls.    Time 8    Period Weeks    Status On-going   deferred until next session   Target Date 09/10/20                   Plan - 08/06/20 1534     Clinical Impression Statement Patient arrived to session with report of increased L hip and knee pain and knee swelling since last session. Focused on pain management and relief today d/t pain levels. Patient tolerated STM and TPR to L buttocks with considerable tightness and TTP demonstrated throughout. Patient demonstrated excellent twitch response in medial piriformis with TPR today. Patient performed gentle hip stretching with consistent cueing to avoid pushing into pain. Also educated patient on self-STM to address pain at home. Encouraged patient to use TENS unit to L buttock for continued pain relief at home. Patient reported understanding of all edu provided today. Ended session with moist heat to L buttocks. No complaints at end of session.    Personal Factors and Comorbidities Age;Comorbidity 3+;Fitness;Past/Current Experience;Time since onset of injury/illness/exacerbation    Comorbidities skin CA, liver transplant, HTN, lumbar DDD    Examination-Activity Limitations Sit;Sleep;Bed Mobility;Bend;Squat;Stairs;Carry;Stand;Toileting;Dressing;Transfers;Hygiene/Grooming;Lift;Locomotion Level;Reach Overhead    Examination-Participation Restrictions Church;Cleaning;Community Activity;Interpersonal Relationship;Laundry;Meal Prep;Occupation;Shop    Stability/Clinical Decision Making Stable/Uncomplicated    Rehab Potential Good  PT  Frequency 2x / week    PT Treatment/Interventions ADLs/Self Care Home Management;Cryotherapy;Electrical Stimulation;Iontophoresis 5m/ml Dexamethasone;Moist Heat;Balance training;Therapeutic exercise;Therapeutic activities;Functional mobility training;Stair training;Gait training;DME Instruction;Ultrasound;Neuromuscular re-education;Patient/family education;Manual techniques;Vasopneumatic Device;Taping;Energy conservation;Dry needling;Passive range of motion;Scar mobilization    PT Next Visit Plan progress balance and strengthening    Consulted and Agree with Plan of Care Patient             Patient will benefit from skilled therapeutic intervention in order to improve the following deficits and impairments:  Abnormal gait, Hypomobility, Increased edema, Decreased activity tolerance, Decreased strength, Increased fascial restricitons, Pain, Decreased balance, Decreased mobility, Difficulty walking, Increased muscle spasms, Improper body mechanics, Decreased range of motion, Impaired flexibility, Postural dysfunction  Visit Diagnosis: Muscle weakness (generalized)  Chronic left-sided low back pain with left-sided sciatica  Chronic pain of left knee  Difficulty in walking, not elsewhere classified  Other abnormalities of gait and mobility     Problem List There are no problems to display for this patient.    YJanene Harvey PT, DPT 08/06/20 3:37 PM    [facility] [address] [place] [address]Phone: [phone]  Fax:  [phone] Name: HJontrell BushongMRN: [id number] of Birth: [birth year]

## 2020-08-11 ENCOUNTER — Other Ambulatory Visit: Payer: Self-pay

## 2020-08-11 ENCOUNTER — Ambulatory Visit: Payer: No Typology Code available for payment source

## 2020-08-11 ENCOUNTER — Ambulatory Visit (HOSPITAL_BASED_OUTPATIENT_CLINIC_OR_DEPARTMENT_OTHER): Payer: Non-veteran care | Admitting: Physical Therapy

## 2020-08-11 DIAGNOSIS — G8929 Other chronic pain: Secondary | ICD-10-CM

## 2020-08-11 DIAGNOSIS — M6281 Muscle weakness (generalized): Secondary | ICD-10-CM

## 2020-08-11 DIAGNOSIS — R2689 Other abnormalities of gait and mobility: Secondary | ICD-10-CM

## 2020-08-11 DIAGNOSIS — R262 Difficulty in walking, not elsewhere classified: Secondary | ICD-10-CM

## 2020-08-11 NOTE — Therapy (Signed)
[facility] [address]  [place], [place], [address] Phone: [phone]   Fax:  [phone]  Physical Therapy Treatment  Patient Details  Name: Andrew Banks MRN: [id number] Date of Birth: [birth year] Referring Provider (PT): Lowella Dandy, MD   Encounter Date: 08/11/2020   PT End of Session - 08/11/20 1453     Visit Number 15    Number of Visits 26    Date for PT Re-Evaluation 09/10/20    Authorization Type Medicare & VA    Authorization - Visit Number 11    Authorization - Number of Visits 30    PT Start Time 5956    PT Stop Time 1530    PT Time Calculation (min) 41 min    Activity Tolerance Patient tolerated treatment well;Patient limited by pain    Behavior During Therapy [facility] for tasks assessed/performed             Past Medical History:  Diagnosis Date   Arthritis    Degenerative disc disease, lumbar    Hypertension    Liver transplanted [facility])    Skin cancer     Past Surgical History:  Procedure Laterality Date   liver transplant      There were no vitals filed for this visit.   Subjective Assessment - 08/11/20 1451     Subjective "I hurt like hell: hip, knee, ankle and foot. It's like bone-on-bone type of grinding".    Pertinent History skin CA, liver transplant, HTN, lumbar DDD    Diagnostic tests none recent    Patient Stated Goals decrease pain    Currently in Pain? Yes    Pain Score 6     Pain Location Hip    Pain Orientation Left;Lateral;Posterior    Pain Descriptors / Indicators Aching    Pain Type Chronic pain    Pain Score 8    Pain Location Knee    Pain Orientation Left    Pain Descriptors / Indicators Aching                               [facility] Adult PT Treatment/Exercise - 08/11/20 0001       Ambulation/Gait   Gait Comments Pt ambulates with R hip higher than L, but improved neutral pelvis and spine following tx; uses Rollator       Posture/Postural Control   Posture Comments cues for upright posture with rollator shifting COM anterior      Self-Care   Self-Care Other Self-Care Comments    Other Self-Care Comments  see pt edu      Lumbar Exercises: Stretches   Lower Trunk Rotation Limitations feet wider than hip width x10 slowly      Lumbar Exercises: Aerobic   Nustep L6 x 6 min (UEs/LEs)      Knee/Hip Exercises: Supine   Bridges with [employer] Strengthening;Both;1 set;15 reps   low bridge, verbal and tactile cues to relax L hip for neutral pelvis (hip flexors hyperactive)   Other Supine Knee/Hip Exercises PPT without glutes/hip flexors - pt demo a lot of compensations trying to use upper abs to press lower ribs down   added ball ADD after 1st set, 2x12x5"   Other Supine Knee/Hip Exercises H/L Clam with RTB and neutral pelvis   tactile and verbal cues to weight L side of pelvis due to anterior rotation     Knee/Hip Exercises: Sidelying  Clams manual assist 10x5-8 seconds   bolster under L knee     Manual Therapy   Manual Therapy Soft tissue mobilization;Myofascial release    Manual therapy comments supine c towel roll under L knee and R sidelying   bolster under L knee in S/L   Soft tissue mobilization STM to L proximal, lateral glutes, TFL and piriformis; L VL    Myofascial Release manual TPR to L proximal, lateral glutes and piriformis   large palpable and painful trigger points throughout                   PT Education - 08/11/20 1549     Education Details educated on pelvic alignment and to try to weight back of L hip more with any supine exercises    Person(s) Educated Patient    Methods Explanation;Demonstration;Tactile cues;Verbal cues    Comprehension Returned demonstration;Verbalized understanding;Tactile cues required;Verbal cues required              PT Short Term Goals - 07/16/20 1526       PT SHORT TERM GOAL #1   Title Patient to be independent with initial HEP.    Time 3     Period Weeks    Status Achieved    Target Date 06/23/20               PT Long Term Goals - 07/16/20 1526       PT LONG TERM GOAL #1   Title Patient to be independent with advanced HEP.    Time 8    Period Weeks    Status Partially Met   met for current   Target Date 09/10/20      PT LONG TERM GOAL #2   Title Patient to demonstrate B LE strength >/=4/5.    Time 8    Period Weeks    Status Partially Met   improving   Target Date 09/10/20      PT LONG TERM GOAL #3   Title Patient to score <15 sec on 5xSTS in order to decrease risk of falls.    Time 8    Period Weeks    Status On-going   16.23 sec no UEs   Target Date 09/10/20      PT LONG TERM GOAL #4   Title Patient to score <14 sec on TUG with LRAD in order to decrease risk of falls.    Time 8    Period Weeks    Status Achieved      PT LONG TERM GOAL #5   Title Patient to score atleast 45/56 on BERG in order to decrease risk of falls.    Time 8    Period Weeks    Status On-going   deferred until next session   Target Date 09/10/20                   Plan - 08/11/20 1453     Clinical Impression Statement Pt arrives to session with report of increased L knee pain and overall L LE pain from hip to foot. He was motivated to complete tx and had no adverse effects. Address soft tissue restrictions in L hip and lateral quad to alleviate pain and prime for therapeutic exercise. Pt has very weak and atrophied glutes, and his pelvis is posteriorly tilted with increased compensation from hip flexors. Pt able to achieve less posterior pelvic tilt, but not fully neutral pelvis in sidelying. However, with reduction, he  reported increased glute firing and hip work in [place]. Focused more on overall alignment today, and pt demonstrated improved posture and gait after session.    Personal Factors and Comorbidities Age;Comorbidity 3+;Fitness;Past/Current Experience;Time since onset of injury/illness/exacerbation     Comorbidities skin CA, liver transplant, HTN, lumbar DDD    Examination-Activity Limitations Sit;Sleep;Bed Mobility;Bend;Squat;Stairs;Carry;Stand;Toileting;Dressing;Transfers;Hygiene/Grooming;Lift;Locomotion Level;Reach Overhead    Examination-Participation Restrictions Church;Cleaning;Community Activity;Interpersonal Relationship;Laundry;Meal Prep;Occupation;Shop    Stability/Clinical Decision Making Stable/Uncomplicated    Rehab Potential Good    PT Frequency 2x / week    PT Treatment/Interventions ADLs/Self Care Home Management;Cryotherapy;Electrical Stimulation;Iontophoresis 12m/ml Dexamethasone;Moist Heat;Balance training;Therapeutic exercise;Therapeutic activities;Functional mobility training;Stair training;Gait training;DME Instruction;Ultrasound;Neuromuscular re-education;Patient/family education;Manual techniques;Vasopneumatic Device;Taping;Energy conservation;Dry needling;Passive range of motion;Scar mobilization    PT Next Visit Plan progress balance and strengthening    Consulted and Agree with Plan of Care Patient             Patient will benefit from skilled therapeutic intervention in order to improve the following deficits and impairments:  Abnormal gait, Hypomobility, Increased edema, Decreased activity tolerance, Decreased strength, Increased fascial restricitons, Pain, Decreased balance, Decreased mobility, Difficulty walking, Increased muscle spasms, Improper body mechanics, Decreased range of motion, Impaired flexibility, Postural dysfunction  Visit Diagnosis: Muscle weakness (generalized)  Chronic left-sided low back pain with left-sided sciatica  Chronic pain of left knee  Difficulty in walking, not elsewhere classified  Other abnormalities of gait and mobility     Problem List There are no problems to display for this patient.   KIzell Midvale PT, DPT 08/11/2020, 3:55 PM  [facility] [place] [address]Phone: [phone]  Fax:  [phone] Name: Andrew TrellaMRN: [id number] of Birth: 2September 19, 1952

## 2020-08-13 ENCOUNTER — Other Ambulatory Visit: Payer: Self-pay

## 2020-08-13 ENCOUNTER — Ambulatory Visit (HOSPITAL_BASED_OUTPATIENT_CLINIC_OR_DEPARTMENT_OTHER): Payer: No Typology Code available for payment source | Attending: General Practice | Admitting: Physical Therapy

## 2020-08-13 ENCOUNTER — Encounter (HOSPITAL_BASED_OUTPATIENT_CLINIC_OR_DEPARTMENT_OTHER): Payer: Self-pay | Admitting: Physical Therapy

## 2020-08-13 DIAGNOSIS — R262 Difficulty in walking, not elsewhere classified: Secondary | ICD-10-CM | POA: Diagnosis present

## 2020-08-13 DIAGNOSIS — M25562 Pain in left knee: Secondary | ICD-10-CM | POA: Diagnosis present

## 2020-08-13 DIAGNOSIS — R2689 Other abnormalities of gait and mobility: Secondary | ICD-10-CM | POA: Diagnosis present

## 2020-08-13 DIAGNOSIS — M6281 Muscle weakness (generalized): Secondary | ICD-10-CM | POA: Diagnosis present

## 2020-08-13 DIAGNOSIS — M5442 Lumbago with sciatica, left side: Secondary | ICD-10-CM | POA: Insufficient documentation

## 2020-08-13 DIAGNOSIS — G8929 Other chronic pain: Secondary | ICD-10-CM | POA: Diagnosis present

## 2020-08-13 NOTE — Therapy (Signed)
[facility] [place], [place], [address] Phone: [phone]   Fax:  [phone]  Physical Therapy Treatment  Patient Details  Name: Andrew Banks MRN: [id number] Date of Birth: Jul 10, 1950 Referring Provider (PT): Lowella Dandy, MD   Encounter Date: 08/13/2020   PT End of Session - 08/13/20 1839     Visit Number 16    Number of Visits 26    PT Start Time 2376    PT Stop Time 2831    PT Time Calculation (min) 44 min    Equipment Utilized During Treatment Gait belt    Activity Tolerance Patient tolerated treatment well;Patient limited by pain             Past Medical History:  Diagnosis Date   Arthritis    Degenerative disc disease, lumbar    Hypertension    Liver transplanted [facility])    Skin cancer     Past Surgical History:  Procedure Laterality Date   liver transplant      There were no vitals filed for this visit.   Subjective Assessment - 08/13/20 1837     Subjective Feeling better after last PT visit doing some step ups on land, made me hurt bad.    Currently in Pain? Yes    Pain Score 5     Pain Location Hip              TREATMENT Pt seen for aquatic therapy today.  Treatment took place in water 3.25-4.8 ft in depth at the [facility] pool. Temp of water was 91.  Pt entered/exited the pool via stairs step to pattern cga with bilat rail.   Seated on water bench, submerged 70% Stretching gastroc and hamstring active and passive Strengthening: ankle buoy for resistance knee flex/ext 2 x 10 reps.     Standing Using ankle cuff buoys: hip abduction/add and hip extension 2 x 10 reps cuing for tight core throughout. Hip flex stretching using noodle 3 x 30 seconds Attempted step ups, not tolerated by rle, x 10 side stepping LLE     [place]: Using foam buoys and belt Hands on bench; knees to chest for LB stretch assisted by therapist  3 x 20 sec.  Rotation to the  right then left 3 x 20 sec hold core strengthening: knees to chest then kick back, rotation right to left with knees to chest. Knee kick down and jack knife stabilized at hip by therapist x 10 reps for abdominal and erector spinae strengthening.  Vertical suspension Straddling noodle bicylcing intervals of 20 sec fast kicking then 40 seconds slow rest x 5, for aerobic capacity challenges and gentle ROM and strengthening of knees and hips   Pt requires buoyancy for support and to offload joints with strengthening exercises. Viscosity of the water is needed for resistance of strengthening; water current perturbations provides challenge to standing balance unsupported, requiring increased core activation.                            PT Short Term Goals - 07/16/20 1526       PT SHORT TERM GOAL #1   Title Patient to be independent with initial HEP.    Time 3    Period Weeks    Status Achieved    Target Date 06/23/20               PT Long Term Goals - 07/16/20  [place] #1   Title Patient to be independent with advanced HEP.    Time 8    Period Weeks    Status Partially Met   met for current   Target Date 09/10/20      PT LONG TERM GOAL #2   Title Patient to demonstrate B LE strength >/=4/5.    Time 8    Period Weeks    Status Partially Met   improving   Target Date 09/10/20      PT LONG TERM GOAL #3   Title Patient to score <15 sec on 5xSTS in order to decrease risk of falls.    Time 8    Period Weeks    Status On-going   16.23 sec no UEs   Target Date 09/10/20      PT LONG TERM GOAL #4   Title Patient to score <14 sec on TUG with LRAD in order to decrease risk of falls.    Time 8    Period Weeks    Status Achieved      PT LONG TERM GOAL #5   Title Patient to score atleast 45/56 on BERG in order to decrease risk of falls.    Time 8    Period Weeks    Status On-going   deferred until next session   Target Date 09/10/20                    Plan - 08/13/20 1841     Clinical Impression Statement Pt with improved knee pain.  PArticipated in aquatic therapy with good effort, some dicomfort from "cracking/popping" in back and hip. Able to gain improved muscle lengthening with adequate stretching in minimal pain range.  Used a modified side yoga plank to stretch and open right lateral core area with good success. Pt kept in motion for entire session pausing for a few short rest periods gaining aerobic capacity benefit    Examination-Activity Limitations Sit;Sleep;Bed Mobility;Bend;Squat;Stairs;Carry;Stand;Toileting;Dressing;Transfers;Hygiene/Grooming;Lift;Locomotion Level;Reach Overhead    Stability/Clinical Decision Making Stable/Uncomplicated    Clinical Decision Making Low    Rehab Potential Good    PT Treatment/Interventions ADLs/Self Care Home Management;Cryotherapy;Electrical Stimulation;Iontophoresis 67m/ml Dexamethasone;Moist Heat;Balance training;Therapeutic exercise;Therapeutic activities;Functional mobility training;Stair training;Gait training;DME Instruction;Ultrasound;Neuromuscular re-education;Patient/family education;Manual techniques;Vasopneumatic Device;Taping;Energy conservation;Dry needling;Passive range of motion;Scar mobilization             Patient will benefit from skilled therapeutic intervention in order to improve the following deficits and impairments:  Abnormal gait, Hypomobility, Increased edema, Decreased activity tolerance, Decreased strength, Increased fascial restricitons, Pain, Decreased balance, Decreased mobility, Difficulty walking, Increased muscle spasms, Improper body mechanics, Decreased range of motion, Impaired flexibility, Postural dysfunction  Visit Diagnosis: Muscle weakness (generalized)  Other abnormalities of gait and mobility  Chronic left-sided low back pain with left-sided sciatica  Chronic pain of left knee  Difficulty in walking, not elsewhere  classified     Problem List There are no problems to display for this patient.   MVedia PereyraMPT 08/13/2020, 6[facility] [address] [address]Phone: [phone]  Fax:  [phone] Name: HShadoe BethelMRN: [id number] of Birth: 2Nov 29, 1952

## 2020-08-18 ENCOUNTER — Ambulatory Visit (HOSPITAL_BASED_OUTPATIENT_CLINIC_OR_DEPARTMENT_OTHER): Payer: No Typology Code available for payment source | Admitting: Physical Therapy

## 2020-08-18 ENCOUNTER — Encounter (HOSPITAL_BASED_OUTPATIENT_CLINIC_OR_DEPARTMENT_OTHER): Payer: Self-pay | Admitting: Physical Therapy

## 2020-08-18 ENCOUNTER — Ambulatory Visit (HOSPITAL_BASED_OUTPATIENT_CLINIC_OR_DEPARTMENT_OTHER): Payer: Non-veteran care | Admitting: Physical Therapy

## 2020-08-18 ENCOUNTER — Other Ambulatory Visit: Payer: Self-pay

## 2020-08-18 DIAGNOSIS — M6281 Muscle weakness (generalized): Secondary | ICD-10-CM

## 2020-08-18 DIAGNOSIS — R2689 Other abnormalities of gait and mobility: Secondary | ICD-10-CM

## 2020-08-18 DIAGNOSIS — R262 Difficulty in walking, not elsewhere classified: Secondary | ICD-10-CM

## 2020-08-18 DIAGNOSIS — G8929 Other chronic pain: Secondary | ICD-10-CM

## 2020-08-18 DIAGNOSIS — M5442 Lumbago with sciatica, left side: Secondary | ICD-10-CM

## 2020-08-18 DIAGNOSIS — M25562 Pain in left knee: Secondary | ICD-10-CM

## 2020-08-18 NOTE — Therapy (Signed)
[facility] [place], [place], [address] Phone: [phone]   Fax:  [phone]  Physical Therapy Treatment  Patient Details  Name: Andrew Banks MRN: [id number] Date of Birth: [birth year] Referring Provider (PT): Lowella Dandy, MD   Encounter Date: 08/18/2020   PT End of Session - 08/18/20 1522     Visit Number 17    Number of Visits 26    Date for PT Re-Evaluation 09/10/20    PT Start Time 1452    PT Stop Time 1530    PT Time Calculation (min) 38 min    Activity Tolerance Patient tolerated treatment well;Patient limited by pain    Behavior During Therapy [facility] for tasks assessed/performed             Past Medical History:  Diagnosis Date   Arthritis    Degenerative disc disease, lumbar    Hypertension    Liver transplanted [facility])    Skin cancer     Past Surgical History:  Procedure Laterality Date   liver transplant      There were no vitals filed for this visit.   Subjective Assessment - 08/18/20 1843     Subjective Wife states pt is hurting today.  Pt amb slowly and labored. "My left knee hip and LB are really hurting me today"    Currently in Pain? Yes    Pain Score 7     Pain Location Hip    Pain Orientation Left    Pain Descriptors / Indicators Aching    Pain Type Chronic pain    Pain Radiating Towards knee and LB    Pain Frequency Intermittent    Pain Score 9    Pain Location Knee    Pain Orientation Left    Pain Descriptors / Indicators Aching    Pain Type Chronic pain    Pain Onset More than a month ago    Pain Frequency Intermittent                                         PT Short Term Goals - 07/16/20 1526       PT SHORT TERM GOAL #1   Title Patient to be independent with initial HEP.    Time 3    Period Weeks    Status Achieved    Target Date 06/23/20               PT Long Term Goals - 07/16/20 1526       PT LONG  TERM GOAL #1   Title Patient to be independent with advanced HEP.    Time 8    Period Weeks    Status Partially Met   met for current   Target Date 09/10/20      PT LONG TERM GOAL #2   Title Patient to demonstrate B LE strength >/=4/5.    Time 8    Period Weeks    Status Partially Met   improving   Target Date 09/10/20      PT LONG TERM GOAL #3   Title Patient to score <15 sec on 5xSTS in order to decrease risk of falls.    Time 8    Period Weeks    Status On-going   16.23 sec no UEs   Target Date 09/10/20      PT LONG TERM GOAL #  4   Title Patient to score <14 sec on TUG with LRAD in order to decrease risk of falls.    Time 8    Period Weeks    Status Achieved      PT LONG TERM GOAL #5   Title Patient to score atleast 45/56 on BERG in order to decrease risk of falls.    Time 8    Period Weeks    Status On-going   deferred until next session   Target Date 09/10/20            TREATMENT Pt seen for aquatic therapy today.  Treatment took place in water 3.25-4.8 ft in depth at the [facility] pool. Temp of water was 91.  Pt entered/exited the pool via stairs step to pattern cga with bilat rail.   Water walking forward, backward and side stepping x 10 widths of pool.  Cueing for step length and pacing as tolerated with knee and LB discomfort.   Seated on water bench, submerged 70% Stretching gastroc, hamstring and adductors active and passive 3 x 20-30 sec hold   Standing Stabilized by therapist knee to chest for LB stretch with gentle overpressure x 3 rep Using ankle cuff buoys: hip abduction/add  2 x 10 reps cuing for tight core throughout. Noodle kick down hip flex and in external rotation 2 x 10 reps   Vertical suspension Straddling noodle bicycling x 10 min, for aerobic capacity challenges and gentle ROM and strengthening of knees and hips   Pt requires buoyancy for support and to offload joints with strengthening exercises. Viscosity of the water is  needed for resistance of strengthening; water current perturbations provides challenge to standing balance unsupported, requiring increased core activation.        Plan - 08/18/20 1849     Clinical Impression Statement Pt uncomofrtable today.  He does reports relief from majority of pain after 10 mins in water.  Modified session, focused on stretching, gentle strengthening and aerobic capacity training. Left knee with some  increase in edema. Upon completion pt reports feeling exercises but better.    Stability/Clinical Decision Making Stable/Uncomplicated    Clinical Decision Making Low    Rehab Potential Good    PT Frequency 2x / week    PT Duration 8 weeks    PT Treatment/Interventions ADLs/Self Care Home Management;Cryotherapy;Electrical Stimulation;Iontophoresis 39m/ml Dexamethasone;Moist Heat;Balance training;Therapeutic exercise;Therapeutic activities;Functional mobility training;Stair training;Gait training;DME Instruction;Ultrasound;Neuromuscular re-education;Patient/family education;Manual techniques;Vasopneumatic Device;Taping;Energy conservation;Dry needling;Passive range of motion;Scar mobilization    PT Home Exercise Plan [id number]            Patient will benefit from skilled therapeutic intervention in order to improve the following deficits and impairments:  Abnormal gait, Hypomobility, Increased edema, Decreased activity tolerance, Decreased strength, Increased fascial restricitons, Pain, Decreased balance, Decreased mobility, Difficulty walking, Increased muscle spasms, Improper body mechanics, Decreased range of motion, Impaired flexibility, Postural dysfunction  Visit Diagnosis: Muscle weakness (generalized)  Other abnormalities of gait and mobility  Chronic left-sided low back pain with left-sided sciatica  Chronic pain of left knee  Difficulty in walking, not elsewhere classified     Problem List There are no problems to display for this  patient.   MVedia PereyraMPT 08/18/2020, 6:57 PM  [facility] [address]GNew [place] [address]Phone: [phone]  Fax:  [phone] Name: HCarnie BruemmerMRN: [id number] of Birth: [birth year]

## 2020-08-20 ENCOUNTER — Ambulatory Visit: Payer: No Typology Code available for payment source

## 2020-08-25 ENCOUNTER — Ambulatory Visit (HOSPITAL_BASED_OUTPATIENT_CLINIC_OR_DEPARTMENT_OTHER): Payer: Non-veteran care | Admitting: Physical Therapy

## 2020-08-25 ENCOUNTER — Ambulatory Visit (HOSPITAL_BASED_OUTPATIENT_CLINIC_OR_DEPARTMENT_OTHER): Payer: No Typology Code available for payment source | Admitting: Physical Therapy

## 2020-08-27 ENCOUNTER — Encounter: Payer: Non-veteran care | Admitting: Physical Therapy

## 2020-09-01 ENCOUNTER — Ambulatory Visit (HOSPITAL_BASED_OUTPATIENT_CLINIC_OR_DEPARTMENT_OTHER): Payer: Non-veteran care | Admitting: Physical Therapy

## 2020-09-03 ENCOUNTER — Encounter: Payer: Non-veteran care | Admitting: Physical Therapy

## 2020-09-08 ENCOUNTER — Other Ambulatory Visit: Payer: Self-pay

## 2020-09-08 ENCOUNTER — Ambulatory Visit (HOSPITAL_BASED_OUTPATIENT_CLINIC_OR_DEPARTMENT_OTHER): Payer: No Typology Code available for payment source | Attending: General Practice | Admitting: Physical Therapy

## 2020-09-08 ENCOUNTER — Encounter (HOSPITAL_BASED_OUTPATIENT_CLINIC_OR_DEPARTMENT_OTHER): Payer: Self-pay | Admitting: Physical Therapy

## 2020-09-08 DIAGNOSIS — R2689 Other abnormalities of gait and mobility: Secondary | ICD-10-CM

## 2020-09-08 DIAGNOSIS — M25562 Pain in left knee: Secondary | ICD-10-CM | POA: Insufficient documentation

## 2020-09-08 DIAGNOSIS — M5442 Lumbago with sciatica, left side: Secondary | ICD-10-CM | POA: Insufficient documentation

## 2020-09-08 DIAGNOSIS — M6281 Muscle weakness (generalized): Secondary | ICD-10-CM | POA: Insufficient documentation

## 2020-09-08 DIAGNOSIS — G8929 Other chronic pain: Secondary | ICD-10-CM | POA: Diagnosis present

## 2020-09-08 DIAGNOSIS — R262 Difficulty in walking, not elsewhere classified: Secondary | ICD-10-CM

## 2020-09-08 NOTE — Therapy (Signed)
[place], [place], [address] Phone: [phone]   Fax:  [phone]  Physical Therapy Treatment  Patient Details  Name: Andrew Banks MRN: [id number] Date of Birth: 1950/03/05 Referring Provider (PT): Lowella Dandy, MD   Encounter Date: 09/08/2020   PT End of Session - 09/08/20 1453     PT Start Time 3500    PT Stop Time 1449    PT Time Calculation (min) 44 min    Equipment Utilized During Treatment Gait belt    Activity Tolerance Patient tolerated treatment well;Patient limited by pain    Behavior During Therapy WFL for tasks assessed/performed             Past Medical History:  Diagnosis Date   Arthritis    Degenerative disc disease, lumbar    Hypertension    Liver transplanted [facility])    Skin cancer     Past Surgical History:  Procedure Laterality Date   liver transplant      There were no vitals filed for this visit.   Subjective Assessment - 09/08/20 1409     Subjective " I am doing well.  Norml pain"  "My Balance is so much better'    Patient is accompained by: Family member    Limitations Sitting;Lifting;Standing;Walking;House hold activities    How long can you walk comfortably? 30-35 mins    Currently in Pain? Yes    Pain Score 5     Pain Location Hip    Pain Orientation Left    Pain Descriptors / Indicators Aching    Pain Type Chronic pain    Pain Onset More than a month ago    Pain Frequency Intermittent    Pain Score 7    Pain Location Knee    Pain Orientation Left    Pain Descriptors / Indicators Aching    Pain Type Chronic pain    Pain Onset More than a month ago    Pain Frequency Intermittent    Aggravating Factors  walking    Pain Score 5    Pain Location Tibia    Pain Orientation Left;Lateral    Pain Type Chronic pain                                         PT Short Term Goals - 07/16/20 1526       PT SHORT  TERM GOAL #1   Title Patient to be independent with initial HEP.    Time 3    Period Weeks    Status Achieved    Target Date 06/23/20               PT Long Term Goals - 07/16/20 1526       PT LONG TERM GOAL #1   Title Patient to be independent with advanced HEP.    Time 8    Period Weeks    Status Partially Met   met for current   Target Date 09/10/20      PT LONG TERM GOAL #2   Title Patient to demonstrate B LE strength >/=4/5.    Time 8    Period Weeks    Status Partially Met   improving   Target Date 09/10/20      PT LONG TERM GOAL #3   Title Patient to score <15 sec on 5xSTS in order  to decrease risk of falls.    Time 8    Period Weeks    Status On-going   16.23 sec no UEs   Target Date 09/10/20      PT LONG TERM GOAL #4   Title Patient to score <14 sec on TUG with LRAD in order to decrease risk of falls.    Time 8    Period Weeks    Status Achieved      PT LONG TERM GOAL #5   Title Patient to score atleast 45/56 on BERG in order to decrease risk of falls.    Time 8    Period Weeks    Status On-going   deferred until next session   Target Date 09/10/20           TREATMENT Pt seen for aquatic therapy today.  Treatment took place in water 3.25-4.8 ft in depth at the [facility] pool. Temp of water was 91.  Pt entered/exited the pool via stairs step to pattern cga with bilat rail.   Water walking forward, backward and side stepping x 4  widths of pool.  Cueing for step length and pacing as tolerated with knee and LB discomfort.   Seated on water bench, submerged 70% Stretching gastroc, hamstring and adductors active and passive 3 x 20-30 sec hold LB knees to chest with gentle overpressure as per therapist  Prone suspension Hands on water bench, LB stretch knees to chest 3 x 20 sec then towards right tan left shoulder for rotator stretch   Standing Using ankle cuff buoys: hip abduction/add  2 x 10 reps cuing for tight core  throughout. Noodle kick down hip flex and in external rotation 2 x 10 reps Balance challenges: decreasing BOS x 3 positions until feet together.  Vision then vision eliminated. X 10 sec each Tandem 3 x 10 sec  Vertical suspension Straddling noodle bicycling x 10 min, for aerobic capacity challenges and gentle ROM and strengthening of knees and hips   Pt requires buoyancy for support and to offload joints with strengthening exercises. Viscosity of the water is needed for resistance of strengthening; water current perturbations provides challenge to standing balance unsupported, requiring increased core activation.  Patient will benefit from skilled therapeutic intervention in order to improve the following deficits and impairments:  Abnormal gait, Hypomobility, Increased edema, Decreased activity tolerance, Decreased strength, Increased fascial restricitons, Pain, Decreased balance, Decreased mobility, Difficulty walking, Increased muscle spasms, Improper body mechanics, Decreased range of motion, Impaired flexibility, Postural dysfunction  Visit Diagnosis: Muscle weakness (generalized)  Other abnormalities of gait and mobility  Chronic left-sided low back pain with left-sided sciatica  Chronic pain of left knee  Difficulty in walking, not elsewhere classified     Problem List There are no problems to display for this patient.   [name]  MPT 09/08/2020, 5:11 PM  [facility] [address] [place], [place], [address] Phone: [phone]   Fax:  [phone]  Name: Andrew Banks MRN: [id number] Date of Birth: [birth year]

## 2020-09-10 ENCOUNTER — Ambulatory Visit: Payer: No Typology Code available for payment source | Attending: General Practice | Admitting: Physical Therapy

## 2020-09-10 DIAGNOSIS — R262 Difficulty in walking, not elsewhere classified: Secondary | ICD-10-CM | POA: Insufficient documentation

## 2020-09-10 DIAGNOSIS — M25562 Pain in left knee: Secondary | ICD-10-CM | POA: Insufficient documentation

## 2020-09-10 DIAGNOSIS — M6281 Muscle weakness (generalized): Secondary | ICD-10-CM | POA: Insufficient documentation

## 2020-09-10 DIAGNOSIS — R2689 Other abnormalities of gait and mobility: Secondary | ICD-10-CM | POA: Insufficient documentation

## 2020-09-10 DIAGNOSIS — G8929 Other chronic pain: Secondary | ICD-10-CM | POA: Insufficient documentation

## 2020-09-10 DIAGNOSIS — M5442 Lumbago with sciatica, left side: Secondary | ICD-10-CM | POA: Insufficient documentation

## 2020-09-15 ENCOUNTER — Other Ambulatory Visit: Payer: Self-pay

## 2020-09-15 ENCOUNTER — Ambulatory Visit (HOSPITAL_BASED_OUTPATIENT_CLINIC_OR_DEPARTMENT_OTHER): Payer: No Typology Code available for payment source | Admitting: Physical Therapy

## 2020-09-15 DIAGNOSIS — G8929 Other chronic pain: Secondary | ICD-10-CM

## 2020-09-15 DIAGNOSIS — M6281 Muscle weakness (generalized): Secondary | ICD-10-CM | POA: Diagnosis not present

## 2020-09-15 DIAGNOSIS — M25562 Pain in left knee: Secondary | ICD-10-CM

## 2020-09-15 DIAGNOSIS — R2689 Other abnormalities of gait and mobility: Secondary | ICD-10-CM

## 2020-09-15 DIAGNOSIS — R262 Difficulty in walking, not elsewhere classified: Secondary | ICD-10-CM

## 2020-09-15 NOTE — Therapy (Signed)
[place], [place], [address] Phone: [phone]   Fax:  [phone]  Physical Therapy Treatment  Patient Details  Name: Andrew Banks MRN: [id number] Date of Birth: 1950/10/04 Referring Provider (PT): Andrew Dandy, MD   Encounter Date: 09/15/2020   PT End of Session - 09/15/20 1223     Visit Number 19    Number of Visits 26    Date for PT Re-Evaluation 09/10/20    Authorization Type Medicare & VA    PT Start Time 1205    PT Stop Time 1251    PT Time Calculation (min) 46 min    Equipment Utilized During Treatment Gait belt    Activity Tolerance Patient tolerated treatment well;Patient limited by pain             Past Medical History:  Diagnosis Date   Arthritis    Degenerative disc disease, lumbar    Hypertension    Liver transplanted [facility])    Skin cancer     Past Surgical History:  Procedure Laterality Date   liver transplant      There were no vitals filed for this visit.   Subjective Assessment - 09/15/20 1354     Subjective "Soar today, joints have been giving me a hard time the last couple of days"    Patient is accompained by: Family member    Currently in Pain? Yes    Pain Score 6     Pain Orientation Left    Pain Descriptors / Indicators Aching    Pain Score 7    Pain Orientation Left    Pain Descriptors / Indicators Aching    Pain Score 5    Pain Location Tibia    Pain Descriptors / Indicators Aching                                         PT Short Term Goals - 07/16/20 1526       PT SHORT TERM GOAL #1   Title Patient to be independent with initial HEP.    Time 3    Period Weeks    Status Achieved    Target Date 06/23/20               PT Long Term Goals - 07/16/20 1526       PT LONG TERM GOAL #1   Title Patient to be independent with advanced HEP.    Time 8    Period Weeks    Status Partially Met   met  for current   Target Date 09/10/20      PT LONG TERM GOAL #2   Title Patient to demonstrate B LE strength >/=4/5.    Time 8    Period Weeks    Status Partially Met   improving   Target Date 09/10/20      PT LONG TERM GOAL #3   Title Patient to score <15 sec on 5xSTS in order to decrease risk of falls.    Time 8    Period Weeks    Status On-going   16.23 sec no UEs   Target Date 09/10/20      PT LONG TERM GOAL #4   Title Patient to score <14 sec on TUG with LRAD in order to decrease risk of falls.    Time 8    Period  Weeks    Status Achieved      PT LONG TERM GOAL #5   Title Patient to score atleast 45/56 on BERG in order to decrease risk of falls.    Time 8    Period Weeks    Status On-going   deferred until next session   Target Date 09/10/20            TREATMENT Pt seen for aquatic therapy today.  Treatment took place in water 3.25-4.8 ft in depth at the [facility] pool. Temp of water was 91.  Pt entered/exited the pool via stairs step to pattern cga with bilat rail.   Water walking forward, backward and side stepping x 4  widths of pool.  Cueing for step length and pacing as tolerated with knee and LB discomfort.   Seated on water bench, submerged 70% Stretching gastroc, hamstring and adductors active and passive 3 x 20-30 sec hold LB knees to chest with gentle overpressure as per therapist   Prone suspension Hands on water bench, LB stretch knees to chest 3 x 20 sec then towards right tan left shoulder for rotator stretch   Standing Using ankle cuff buoys: hip abduction/add  and circles 2 x 10 reps cuing for tight core throughout. (Pt c/o LB/hip popping) Noodle kick down hip flex and in external rotation 2 x 10 reps Core strenthening  obliques kickbord rotation pushing and pulling 2 x 10  Planks: kick board pushdowns 2 x 10 VC, TC and demonstration for proper execution and pacing.  Balance and core strength challenges standing with wide BOS on  noodle.  Gaining static balance with heels and then toes on ground x 30 sec (requiring many trails); Than progressed to rotating over noodle gaining balance heel and toe x 5 reps.      Pt requires buoyancy for support and to offload joints with strengthening exercises. Viscosity of the water is needed for resistance of strengthening; water current perturbations provides challenge to standing balance unsupported, requiring increased core activation.       Plan - 09/15/20 1357     Clinical Impression Statement Focused on right hip and knee as well as LB stretching and ex.  Continued with balance challenges. Instruction on balance technique using hip strategy.  Pt with continued improvment in balance as reported by a slight LOB with indep recovery.    Personal Factors and Comorbidities Age    PT Treatment/Interventions ADLs/Self Care Home Management;Cryotherapy;Electrical Stimulation;Iontophoresis 37m/ml Dexamethasone;Moist Heat;Balance training;Therapeutic exercise;Therapeutic activities;Functional mobility training;Stair training;Gait training;DME Instruction;Ultrasound;Neuromuscular re-education;Patient/family education;Manual techniques;Vasopneumatic Device;Taping;Energy conservation;Dry needling;Passive range of motion;Scar mobilization             Patient will benefit from skilled therapeutic intervention in order to improve the following deficits and impairments:  Abnormal gait, Hypomobility, Increased edema, Decreased activity tolerance, Decreased strength, Increased fascial restricitons, Pain, Decreased balance, Decreased mobility, Difficulty walking, Increased muscle spasms, Improper body mechanics, Decreased range of motion, Impaired flexibility, Postural dysfunction  Visit Diagnosis: Muscle weakness (generalized)  Difficulty in walking, not elsewhere classified  Other abnormalities of gait and mobility  Chronic left-sided low back pain with left-sided sciatica  Chronic pain  of left knee     Problem List There are no problems to display for this patient.   MVedia Banks 09/15/2020, 2:03 PM  [facility] [address] [address]Phone: [phone]  Fax:  [phone] Name: Andrew MarstonMRN: [id number] of Birth: 2Apr 21, 1952

## 2020-09-17 ENCOUNTER — Ambulatory Visit: Payer: No Typology Code available for payment source | Admitting: Physical Therapy

## 2020-09-17 ENCOUNTER — Other Ambulatory Visit: Payer: Self-pay

## 2020-09-17 DIAGNOSIS — M5442 Lumbago with sciatica, left side: Secondary | ICD-10-CM

## 2020-09-17 DIAGNOSIS — R2689 Other abnormalities of gait and mobility: Secondary | ICD-10-CM | POA: Diagnosis present

## 2020-09-17 DIAGNOSIS — R262 Difficulty in walking, not elsewhere classified: Secondary | ICD-10-CM

## 2020-09-17 DIAGNOSIS — G8929 Other chronic pain: Secondary | ICD-10-CM | POA: Diagnosis present

## 2020-09-17 DIAGNOSIS — M25562 Pain in left knee: Secondary | ICD-10-CM | POA: Diagnosis present

## 2020-09-17 DIAGNOSIS — M6281 Muscle weakness (generalized): Secondary | ICD-10-CM | POA: Diagnosis present

## 2020-09-17 NOTE — Therapy (Signed)
[place] [address]  [place], [place], [address] Phone: [phone]   Fax:  [phone]  Physical Therapy Treatment/Progress Note   Progress Note Reporting Period 07/16/2020 to 09/17/2020  See note below for Objective Data and Assessment of Progress/Goals.     Patient Details  Name: Andrew Banks MRN: [id number] Date of Birth: 22-Aug-1950 Referring Provider (PT): Lowella Dandy, MD   Encounter Date: 09/17/2020   PT End of Session - 09/17/20 1744     Visit Number 20    Number of Visits 45    Date for PT Re-Evaluation 09/10/20    Authorization Type Medicare & VA    Authorization - Number of Visits 45    Progress Note Due on Visit 30    PT Start Time 1450    PT Stop Time 1530    PT Time Calculation (min) 40 min    Equipment Utilized During Treatment Gait belt    Activity Tolerance Patient tolerated treatment well             Past Medical History:  Diagnosis Date   Arthritis    Degenerative disc disease, lumbar    Hypertension    Liver transplanted [facility])    Skin cancer     Past Surgical History:  Procedure Laterality Date   liver transplant      There were no vitals filed for this visit.   Subjective Assessment - 09/17/20 1448     Subjective My leg isn't quite right, but my pelvic is better and my low back is ok.    Pertinent History skin CA, liver transplant, HTN, lumbar DDD    Limitations Sitting;Lifting;Standing;Walking;House hold activities    How long can you walk comfortably? 30-35 mins    Diagnostic tests none recent    Patient Stated Goals decrease pain    Currently in Pain? Yes    Pain Score 4     Pain Location Hip    Pain Orientation Left    Pain Descriptors / Indicators Aching    Pain Type Chronic pain    Pain Score 4    Pain Location Knee    Pain Orientation Left    Pain Descriptors / Indicators Aching    Pain Score 6    Pain Location Ankle    Pain  Orientation Left    Pain Type Chronic pain                [facility] PT Assessment - 09/17/20 0001       Assessment   Medical Diagnosis Closed fx of distal end of L femur    Referring Provider (PT) Lowella Dandy, MD    Onset Date/Surgical Date 10/02/19    Prior Therapy yes- [facility]      Strength   Overall Strength Deficits    Right Hip Flexion 4+/5    Right Hip Extension 4+/5    Right Hip ABduction 4+/5    Right Hip ADduction 4+/5    Left Hip Flexion 4/5    Left Hip Extension 4+/5    Left Hip ABduction 4+/5    Left Hip ADduction 4+/5    Right Knee Flexion 4+/5    Right Knee Extension 4+/5    Left Knee Flexion 4+/5    Left Knee Extension 4+/5    Right Ankle Dorsiflexion 4+/5    Left Ankle Dorsiflexion 3/5    Left Ankle Eversion 2/5      Ambulation/Gait  Gait Comments Rollator, wide BOS      Standardized Balance Assessment   Five times sit to stand comments  12.5 sec no UE      Berg Balance Test   Sit to Stand Able to stand without using hands and stabilize independently    Standing Unsupported Able to stand 2 minutes with supervision    Sitting with Back Unsupported but Feet Supported on Floor or Stool Able to sit safely and securely 2 minutes    Stand to Sit Sits safely with minimal use of hands    Transfers Able to transfer safely, definite need of hands    Standing Unsupported with Eyes Closed Able to stand 10 seconds with supervision    Standing Unsupported with Feet Together Able to place feet together independently and stand for 1 minute with supervision    From Standing, Reach Forward with Outstretched Arm Can reach forward >12 cm safely (5")    From Standing Position, Pick up Object from Floor Able to pick up shoe, needs supervision    From Standing Position, Turn to Look Behind Over each Shoulder Looks behind one side only/other side shows less weight shift    Turn 360 Degrees Needs close supervision or verbal cueing    Standing Unsupported,  Alternately Place Feet on Step/Stool Able to complete >2 steps/needs minimal assist    Standing Unsupported, One Foot in Front Able to plae foot ahead of the other independently and hold 30 seconds    Standing on One Leg Tries to lift leg/unable to hold 3 seconds but remains standing independently    Total Score 39                           [facility] Adult PT Treatment/Exercise - 09/17/20 0001       Transfers   Five time sit to stand comments  12.5 sec      Exercises   Exercises Ankle      Lumbar Exercises: Aerobic   Nustep L6 x 6 min (UEs/LEs)      Ankle Exercises: Seated   ABC's 1 rep    Other Seated Ankle Exercises anke eversion AROM x 10                      PT Short Term Goals - 07/16/20 1526       PT SHORT TERM GOAL #1   Title Patient to be independent with initial HEP.    Time 3    Period Weeks    Status Achieved    Target Date 06/23/20               PT Long Term Goals - 09/17/20 1456       PT LONG TERM GOAL #1   Title Patient to be independent with advanced HEP.    Time 10    Period Weeks    Status Partially Met   met for current   Target Date 11/26/20      PT LONG TERM GOAL #2   Title Patient to demonstrate B LE strength >/=4/5.    Time 10    Period Weeks    Status Partially Met   improving, weakness L HS, L glut, L ankle eversion   Target Date 11/26/20      PT LONG TERM GOAL #3   Title Patient to score <15 sec on 5xSTS in order to decrease risk of falls.  Time 8    Period Weeks    Status Achieved   12.5 sec without UE     PT LONG TERM GOAL #4   Title Patient to score <14 sec on TUG with LRAD in order to decrease risk of falls.    Time 10    Period Weeks    Status Achieved    Target Date 11/26/20      PT LONG TERM GOAL #5   Title Patient to score atleast 45/56 on BERG in order to decrease risk of falls.    Time 10    Period Weeks    Status On-going   39/56   Target Date 11/26/20                    Plan - 09/17/20 1759     Clinical Impression Statement Patient is making good progress towards goals.  He reports significant progress with balance due to aquatic therapy, but stil very fearful of falls.  Today he scored 39/56 on Berg, which still indicates high risk for falls and need for RW.   He also has weakness in L glut, L HS, and also L ankle for DF and eversion.   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  May need to consider orthotics/wedging to improve ankle stability.  He does demonstrate improved functional LE strength, able to complete 5x STS today in 12.5 sec without UE support.  Pt. would benefit from skilled physical to improve strength, balance, gait and mobility.    Personal Factors and Comorbidities Age    PT Frequency 2x / week    PT Duration Other (comment)   10 weeks   PT Treatment/Interventions ADLs/Self Care Home Management;Cryotherapy;Electrical Stimulation;Iontophoresis 21m/ml Dexamethasone;Moist Heat;Balance training;Therapeutic exercise;Therapeutic activities;Functional mobility training;Stair training;Gait training;DME Instruction;Ultrasound;Neuromuscular re-education;Patient/family education;Manual techniques;Vasopneumatic Device;Taping;Energy conservation;Dry needling;Passive range of motion;Scar mobilization    PT Next Visit Plan progress balance and strengthening    PT Home Exercise Plan [id number]   Consulted and Agree with Plan of Care Patient             Patient will benefit from skilled therapeutic intervention in order to improve the following deficits and impairments:  Abnormal gait, Hypomobility, Increased edema, Decreased activity tolerance, Decreased strength, Increased fascial restricitons, Pain, Decreased balance, Decreased mobility, Difficulty walking, Increased muscle spasms, Improper body mechanics, Decreased range of motion, Impaired flexibility, Postural dysfunction  Visit  Diagnosis: Muscle weakness (generalized)  Difficulty in walking, not elsewhere classified  Other abnormalities of gait and mobility  Chronic left-sided low back pain with left-sided sciatica  Chronic pain of left knee     Problem List There are no problems to display for this patient.   ERennie NatterPT, DPT 09/17/2020, 6:07 PM  [facility] [place] [address]Phone: [phone]  Fax:  [phone] Name: HJacques FifeMRN: [id number] of Birth: [birth year]

## 2020-09-22 ENCOUNTER — Other Ambulatory Visit: Payer: Self-pay

## 2020-09-22 ENCOUNTER — Encounter (HOSPITAL_BASED_OUTPATIENT_CLINIC_OR_DEPARTMENT_OTHER): Payer: Self-pay | Admitting: Physical Therapy

## 2020-09-22 ENCOUNTER — Ambulatory Visit (HOSPITAL_BASED_OUTPATIENT_CLINIC_OR_DEPARTMENT_OTHER): Payer: No Typology Code available for payment source | Admitting: Physical Therapy

## 2020-09-22 DIAGNOSIS — R2689 Other abnormalities of gait and mobility: Secondary | ICD-10-CM

## 2020-09-22 DIAGNOSIS — M5442 Lumbago with sciatica, left side: Secondary | ICD-10-CM

## 2020-09-22 DIAGNOSIS — R262 Difficulty in walking, not elsewhere classified: Secondary | ICD-10-CM

## 2020-09-22 DIAGNOSIS — G8929 Other chronic pain: Secondary | ICD-10-CM

## 2020-09-22 DIAGNOSIS — M25562 Pain in left knee: Secondary | ICD-10-CM

## 2020-09-22 DIAGNOSIS — M6281 Muscle weakness (generalized): Secondary | ICD-10-CM

## 2020-09-22 NOTE — Therapy (Signed)
[place], [place], [address] Phone: [phone]   Fax:  [phone]  Physical Therapy Treatment  Patient Details  Name: Andrew Banks MRN: [id number] Date of Birth: 11/26/1950 Referring Provider (PT): Lowella Dandy, MD   Encounter Date: 09/22/2020   PT End of Session - 09/22/20 1208     Visit Number 21    Number of Visits 45    Date for PT Re-Evaluation 09/10/20    Authorization Type Medicare & VA    PT Start Time 1201    PT Stop Time 1245    PT Time Calculation (min) 44 min    Equipment Utilized During Treatment Gait belt    Activity Tolerance Patient tolerated treatment well    Behavior During Therapy WFL for tasks assessed/performed             Past Medical History:  Diagnosis Date   Arthritis    Degenerative disc disease, lumbar    Hypertension    Liver transplanted [facility])    Skin cancer     Past Surgical History:  Procedure Laterality Date   liver transplant      There were no vitals filed for this visit.   Subjective Assessment - 09/22/20 1303     Subjective "My back/hip is hurting me more today than it has been"    Currently in Pain? Yes    Pain Score 7     Pain Orientation Right    Pain Descriptors / Indicators Aching    Pain Type Acute pain                                       PT Education - 09/22/20 1305     Education Details Pt edu on added HEP for right core stretching    Person(s) Educated Patient    Methods Demonstration              PT Short Term Goals - 07/16/20 1526       PT SHORT TERM GOAL #1   Title Patient to be independent with initial HEP.    Time 3    Period Weeks    Status Achieved    Target Date 06/23/20               PT Long Term Goals - 09/17/20 1456       PT LONG TERM GOAL #1   Title Patient to be independent with advanced HEP.    Time 10    Period Weeks    Status Partially Met    met for current   Target Date 11/26/20      PT LONG TERM GOAL #2   Title Patient to demonstrate B LE strength >/=4/5.    Time 10    Period Weeks    Status Partially Met   improving, weakness L HS, L glut, L ankle eversion   Target Date 11/26/20      PT LONG TERM GOAL #3   Title Patient to score <15 sec on 5xSTS in order to decrease risk of falls.    Time 8    Period Weeks    Status Achieved   12.5 sec without UE     PT LONG TERM GOAL #4   Title Patient to score <14 sec on TUG with LRAD in order to decrease risk of falls.    Time  10    Period Weeks    Status Achieved    Target Date 11/26/20      PT LONG TERM GOAL #5   Title Patient to score atleast 45/56 on BERG in order to decrease risk of falls.    Time 10    Period Weeks    Status On-going   39/56   Target Date 11/26/20             TREATMENT Pt seen for aquatic therapy today.  Treatment took place in water 3.25-4.8 ft in depth at the [facility] pool. Temp of water was 91.  Pt entered/exited the pool via stairs step to pattern cga with bilat rail.   Water walking forward, backward and side stepping x 4  widths of pool.  Cueing for step length and pacing as tolerated with knee and LB discomfort.   Seated on water bench, submerged 70% Stretching gastroc, hamstring and adductors active and passive 3 x 20-30 sec hold    Prone suspension Hands on water bench, LB stretch knees to chest 3 x 20 sec then towards right than left shoulder for rotator stretch   Standing Using ankle cuff buoys: hip abduction/add  and circles 2 x 10 reps cuing for tight core throughout. (Pt c/o LB/hip popping) Core strenthening  obliques kickbord rotation pushing and pulling x 10  Planks: kick board pushdowns 2 x 10 VC, TC and demonstration for proper execution and pacing.   Water walking x 2 lengths between each activity  Pt requires buoyancy for support and to offload joints with strengthening exercises. Viscosity of the water  is needed for resistance of strengthening; water current perturbations provides challenge to standing balance unsupported, requiring increased core activation.    Clinical assessment Trigger point right mid iliac area causing increased discomofrt today limiting pt toleration to session.  Manual deep pressure to area with pt in standing and then again while stretching LB.  Pt with some relief in area with side bending/stretching and in knees to chest position.  Highly fatiguable today.  Plan - 09/22/20 1311     PT Next Visit Plan Dry Needling??    PT Home Exercise Plan [phone]             Patient will benefit from skilled therapeutic intervention in order to improve the following deficits and impairments:  Abnormal gait, Hypomobility, Increased edema, Decreased activity tolerance, Decreased strength, Increased fascial restricitons, Pain, Decreased balance, Decreased mobility, Difficulty walking, Increased muscle spasms, Improper body mechanics, Decreased range of motion, Impaired flexibility, Postural dysfunction  Visit Diagnosis: Muscle weakness (generalized)  Chronic pain of left knee  Difficulty in walking, not elsewhere classified  Other abnormalities of gait and mobility  Chronic left-sided low back pain with left-sided sciatica     Problem List There are no problems to display for this patient.   [name] MPT 09/22/2020, 1:13 PM  [facility] [address] [place], [place], [address] Phone: [phone]   Fax:  [phone]  Name: Andrew Banks MRN: [id number] Date of Birth: 03-26-1950

## 2020-09-24 ENCOUNTER — Other Ambulatory Visit: Payer: Self-pay

## 2020-09-24 ENCOUNTER — Ambulatory Visit: Payer: No Typology Code available for payment source | Admitting: Physical Therapy

## 2020-09-24 DIAGNOSIS — M6281 Muscle weakness (generalized): Secondary | ICD-10-CM

## 2020-09-24 DIAGNOSIS — R262 Difficulty in walking, not elsewhere classified: Secondary | ICD-10-CM

## 2020-09-24 DIAGNOSIS — M25562 Pain in left knee: Secondary | ICD-10-CM

## 2020-09-24 DIAGNOSIS — M5442 Lumbago with sciatica, left side: Secondary | ICD-10-CM

## 2020-09-24 DIAGNOSIS — R2689 Other abnormalities of gait and mobility: Secondary | ICD-10-CM

## 2020-09-24 DIAGNOSIS — G8929 Other chronic pain: Secondary | ICD-10-CM

## 2020-09-24 NOTE — Therapy (Signed)
[facility] [address]  [place], [place], [address] Phone: [phone]   Fax:  [phone]  Physical Therapy Treatment  Patient Details  Name: Andrew Banks MRN: [id number] Date of Birth: 1950-09-12 Referring Provider (PT): Lowella Dandy, MD   Encounter Date: 09/24/2020   PT End of Session - 09/24/20 1602     Visit Number 22    Number of Visits 45    Date for PT Re-Evaluation 11/26/20    Authorization Type Medicare & VA    PT Start Time 0247    PT Stop Time 0330    PT Time Calculation (min) 43 min    Equipment Utilized During Treatment Gait belt    Activity Tolerance Patient tolerated treatment well    Behavior During Therapy WFL for tasks assessed/performed             Past Medical History:  Diagnosis Date   Arthritis    Degenerative disc disease, lumbar    Hypertension    Liver transplanted [facility])    Skin cancer     Past Surgical History:  Procedure Laterality Date   liver transplant      There were no vitals filed for this visit.   Subjective Assessment - 09/24/20 1514     Subjective Patient reports back pain is much better after PT worked at aquatic therapy.  His general pain in back/hips is back to baseline.    Patient Stated Goals decrease pain    Currently in Pain? Yes    Pain Score 6     Pain Location Back    Pain Score 6    Pain Location Hip    Pain Orientation Right;Left                               [facility] Adult PT Treatment/Exercise - 09/24/20 0001       Exercises   Exercises Ankle;Lumbar      Lumbar Exercises: Aerobic   Nustep L5 x 5 min LE only      Lumbar Exercises: Supine   Pelvic Tilt 10 reps    Bridge 10 reps    Bridge Limitations with PPT    Bridge with March 10 reps      Manual Therapy   Manual Therapy Soft tissue mobilization    Soft tissue mobilization TPR to R hip      Ankle Exercises: Standing   BAPS Sitting;Level 2    Left ankle   BAPS Limitations PF/DF x 20, Inv/EVER x 20, CW x 10, CCW x 10, minA                 Balance Exercises - 09/24/20 0001       Balance Exercises: Standing   Step Over Hurdles / [place]es stepping over theraband 2 x 10 bil with mirror to improve posture, CGA for safety.                 PT Short Term Goals - 07/16/20 1526       PT SHORT TERM GOAL #1   Title Patient to be independent with initial HEP.    Time 3    Period Weeks    Status Achieved    Target Date 06/23/20               PT Long Term Goals - 09/17/20 1456       PT  LONG TERM GOAL #1   Title Patient to be independent with advanced HEP.    Time 10    Period Weeks    Status Partially Met   met for current   Target Date 11/26/20      PT LONG TERM GOAL #2   Title Patient to demonstrate B LE strength >/=4/5.    Time 10    Period Weeks    Status Partially Met   improving, weakness L HS, L glut, L ankle eversion   Target Date 11/26/20      PT LONG TERM GOAL #3   Title Patient to score <15 sec on 5xSTS in order to decrease risk of falls.    Time 8    Period Weeks    Status Achieved   12.5 sec without UE     PT LONG TERM GOAL #4   Title Patient to score <14 sec on TUG with LRAD in order to decrease risk of falls.    Time 10    Period Weeks    Status Achieved    Target Date 11/26/20      PT LONG TERM GOAL #5   Title Patient to score atleast 45/56 on BERG in order to decrease risk of falls.    Time 10    Period Weeks    Status On-going   39/56   Target Date 11/26/20                   Plan - 09/24/20 1604     Clinical Impression Statement Focus of today's session on R ankle strengthening, balance exercise to improve weight shifting and SLS, and core strengthening.  He tolerated all exercises very well and was very encouraged by ability to weight shift fully onto LLE today to step over theraband.  Some difficulty with bridges - reported significant "popping" and creaking in  hips and knees with exercise.  He would benefit from continued skiled therapy    Personal Factors and Comorbidities Fitness    Comorbidities skin CA, liver transplant, HTN, lumbar DDD    Examination-Activity Limitations Sit;Sleep;Bed Mobility;Bend;Squat;Stairs;Carry;Stand;Toileting;Dressing;Transfers;Hygiene/Grooming;Lift;Locomotion Level;Reach Overhead    Examination-Participation Restrictions Church;Cleaning;Community Activity;Interpersonal Relationship;Laundry;Meal Prep;Occupation;Shop    PT Frequency 2x / week    PT Duration Other (comment)    PT Treatment/Interventions ADLs/Self Care Home Management;Cryotherapy;Electrical Stimulation;Iontophoresis 49m/ml Dexamethasone;Moist Heat;Balance training;Therapeutic exercise;Therapeutic activities;Functional mobility training;Stair training;Gait training;DME Instruction;Ultrasound;Neuromuscular re-education;Patient/family education;Manual techniques;Vasopneumatic Device;Taping;Energy conservation;Dry needling;Passive range of motion;Scar mobilization    PT Next Visit Plan continue balance and L ankle strengthening.    Consulted and Agree with Plan of Care Patient             Patient will benefit from skilled therapeutic intervention in order to improve the following deficits and impairments:  Abnormal gait, Hypomobility, Increased edema, Decreased activity tolerance, Decreased strength, Increased fascial restricitons, Pain, Decreased balance, Decreased mobility, Difficulty walking, Increased muscle spasms, Improper body mechanics, Decreased range of motion, Impaired flexibility, Postural dysfunction  Visit Diagnosis: Muscle weakness (generalized)  Chronic pain of left knee  Difficulty in walking, not elsewhere classified  Other abnormalities of gait and mobility  Chronic left-sided low back pain with left-sided sciatica     Problem List There are no problems to display for this patient.   ERennie NatterPT, DPT 09/24/2020, 5:27  PM  [facility] [place] [address]Phone: [phone]  Fax:  [phone] Name: HRaijon LindforsMRN: [id number]  Date of Birth: 07/01/1950

## 2020-10-01 ENCOUNTER — Ambulatory Visit (HOSPITAL_BASED_OUTPATIENT_CLINIC_OR_DEPARTMENT_OTHER): Payer: No Typology Code available for payment source | Admitting: Physical Therapy

## 2020-10-08 ENCOUNTER — Ambulatory Visit (HOSPITAL_BASED_OUTPATIENT_CLINIC_OR_DEPARTMENT_OTHER): Payer: Non-veteran care | Admitting: Physical Therapy

## 2020-10-13 ENCOUNTER — Encounter: Payer: Non-veteran care | Admitting: Physical Therapy

## 2020-10-15 ENCOUNTER — Ambulatory Visit (HOSPITAL_BASED_OUTPATIENT_CLINIC_OR_DEPARTMENT_OTHER): Payer: Non-veteran care | Admitting: Physical Therapy

## 2020-10-20 ENCOUNTER — Ambulatory Visit: Payer: No Typology Code available for payment source | Admitting: Physical Therapy

## 2020-10-22 ENCOUNTER — Ambulatory Visit (HOSPITAL_BASED_OUTPATIENT_CLINIC_OR_DEPARTMENT_OTHER): Payer: No Typology Code available for payment source | Attending: General Practice | Admitting: Physical Therapy

## 2020-10-22 ENCOUNTER — Other Ambulatory Visit: Payer: Self-pay

## 2020-10-22 ENCOUNTER — Encounter (HOSPITAL_BASED_OUTPATIENT_CLINIC_OR_DEPARTMENT_OTHER): Payer: Self-pay | Admitting: Physical Therapy

## 2020-10-22 DIAGNOSIS — M25562 Pain in left knee: Secondary | ICD-10-CM | POA: Insufficient documentation

## 2020-10-22 DIAGNOSIS — R262 Difficulty in walking, not elsewhere classified: Secondary | ICD-10-CM | POA: Diagnosis present

## 2020-10-22 DIAGNOSIS — R2689 Other abnormalities of gait and mobility: Secondary | ICD-10-CM | POA: Diagnosis present

## 2020-10-22 DIAGNOSIS — M5442 Lumbago with sciatica, left side: Secondary | ICD-10-CM | POA: Diagnosis present

## 2020-10-22 DIAGNOSIS — G8929 Other chronic pain: Secondary | ICD-10-CM | POA: Diagnosis present

## 2020-10-22 DIAGNOSIS — M6281 Muscle weakness (generalized): Secondary | ICD-10-CM | POA: Insufficient documentation

## 2020-10-22 NOTE — Therapy (Signed)
[place], [place], [address] Phone: [phone]   Fax:  [phone]  Physical Therapy Treatment  Patient Details  Name: Andrew Banks MRN: [id number] Date of Birth: 14-Feb-1950 Referring Provider (PT): Lowella Dandy, MD   Encounter Date: 10/22/2020   PT End of Session - 10/22/20 1207     Visit Number 23    Number of Visits 45    Date for PT Re-Evaluation 11/26/20    Authorization Type Medicare & VA    PT Start Time 4158    PT Stop Time 1245    PT Time Calculation (min) 44 min    Equipment Utilized During Treatment Gait belt    Activity Tolerance Patient tolerated treatment well    Behavior During Therapy WFL for tasks assessed/performed             Past Medical History:  Diagnosis Date   Arthritis    Degenerative disc disease, lumbar    Hypertension    Liver transplanted [facility])    Skin cancer     Past Surgical History:  Procedure Laterality Date   liver transplant      There were no vitals filed for this visit.   Subjective Assessment - 10/22/20 1212     Subjective "My left knee and ankle are worsening, ankle > knee."                                          PT Short Term Goals - 07/16/20 1526       PT SHORT TERM GOAL #1   Title Patient to be independent with initial HEP.    Time 3    Period Weeks    Status Achieved    Target Date 06/23/20               PT Long Term Goals - 09/17/20 1456       PT LONG TERM GOAL #1   Title Patient to be independent with advanced HEP.    Time 10    Period Weeks    Status Partially Met   met for current   Target Date 11/26/20      PT LONG TERM GOAL #2   Title Patient to demonstrate B LE strength >/=4/5.    Time 10    Period Weeks    Status Partially Met   improving, weakness L HS, L glut, L ankle eversion   Target Date 11/26/20      PT LONG TERM GOAL #3   Title Patient to score <15  sec on 5xSTS in order to decrease risk of falls.    Time 8    Period Weeks    Status Achieved   12.5 sec without UE     PT LONG TERM GOAL #4   Title Patient to score <14 sec on TUG with LRAD in order to decrease risk of falls.    Time 10    Period Weeks    Status Achieved    Target Date 11/26/20      PT LONG TERM GOAL #5   Title Patient to score atleast 45/56 on BERG in order to decrease risk of falls.    Time 10    Period Weeks    Status On-going   39/56   Target Date 11/26/20  TREATMENT Pt seen for aquatic therapy today.  Treatment took place in water 3.25-4.8 ft in depth at the [facility] pool. Temp of water was 91.  Pt entered/exited the pool via stairs step to pattern cga with bilat rail.   Water walking forward, backward and side stepping x 4  widths of pool.  Cueing for step length and pacing as tolerated with knee and LB discomfort.   Seated on water bench, submerged 70% Stretching gastroc, hamstring and adductors active and passive 3 x 20-30 sec hold     Prone suspension Hands on water bench, LB stretch knees to chest 3 x 20 sec then towards right than left shoulder for rotator stretch   Standing Core strenthening  Using ankle cuff buoys and 2 foam hand buoys: In 4.8 ft hand buoys submerged 2 inches - knees to chest x 10 reps -Suspended in supine holding to buoys (engaging entire core) knees to chest 2 x 5 - standing<>supine 2 x 5  -planks submerging buoys with extended elbows/shoulder flexed 90 deg, pt instructed to hold position.  3 trials.  Pt with difficulty gaining and maintaining position.  Best hold x 3 sec Core strengthen and aerobic capacity challenges same position as above. Flutter kicking at hip 2 tirals of 20 reps; flutter at knees 2 x 20, scissoring 2 x 20, add/abd x 20  Pt requires VC, TC and demonstration for proper execution as well as several rest periods.    Water walking x 2 lengths between each activity   Pt  requires buoyancy for support and to offload joints with strengthening exercises. Viscosity of the water is needed for resistance of strengthening; water current perturbations provides challenge to standing balance unsupported, requiring increased core activation.         Plan - 10/22/20 1215     Clinical Impression Statement Left ankle beginning to contract into eversion and supination.  Pt reprots having difficulty getting foot flat on land with amb. Has been a source of increasing pain.  I reached out to land therapist for further evaluation.  Advanced core strengthening today using barbell. Pt only able to tolerate ~5 rep at a time due to the strength it requires.  Did have some Left hip discomfort intially but was able to modify to decrease symptoms. Pt demonstrating improvement in core strength as evidenced by initiation and completion of advanced core stregthening challenges.    PT Treatment/Interventions ADLs/Self Care Home Management;Cryotherapy;Electrical Stimulation;Iontophoresis 69m/ml Dexamethasone;Moist Heat;Balance training;Therapeutic exercise;Therapeutic activities;Functional mobility training;Stair training;Gait training;DME Instruction;Ultrasound;Neuromuscular re-education;Patient/family education;Manual techniques;Vasopneumatic Device;Taping;Energy conservation;Dry needling;Passive range of motion;Scar mobilization    PT Next Visit Plan On land therapist evaluate for potntial need for left ankle orthotic/brace.    PT Home Exercise Plan [phone]            Patient will benefit from skilled therapeutic intervention in order to improve the following deficits and impairments:  Abnormal gait, Hypomobility, Increased edema, Decreased activity tolerance, Decreased strength, Increased fascial restricitons, Pain, Decreased balance, Decreased mobility, Difficulty walking, Increased muscle spasms, Improper body mechanics, Decreased range of motion, Impaired flexibility, Postural  dysfunction  Visit Diagnosis: Muscle weakness (generalized)  Chronic pain of left knee  Difficulty in walking, not elsewhere classified  Other abnormalities of gait and mobility  Chronic left-sided low back pain with left-sided sciatica     Problem List There are no problems to display for this patient.   Andrew Banks MPT  10/22/2020, 1:20 PM  [phone]  [place], [place], [address] Phone: [phone]   Fax:  [phone]  Name: Andrew Banks MRN: [id number] Date of Birth: 1950/09/11

## 2020-10-27 ENCOUNTER — Ambulatory Visit: Payer: No Typology Code available for payment source | Attending: General Practice | Admitting: Physical Therapy

## 2020-10-27 ENCOUNTER — Other Ambulatory Visit: Payer: Self-pay

## 2020-10-27 ENCOUNTER — Encounter: Payer: Self-pay | Admitting: Physical Therapy

## 2020-10-27 DIAGNOSIS — M25562 Pain in left knee: Secondary | ICD-10-CM | POA: Diagnosis present

## 2020-10-27 DIAGNOSIS — M6281 Muscle weakness (generalized): Secondary | ICD-10-CM | POA: Insufficient documentation

## 2020-10-27 DIAGNOSIS — M5442 Lumbago with sciatica, left side: Secondary | ICD-10-CM | POA: Insufficient documentation

## 2020-10-27 DIAGNOSIS — G8929 Other chronic pain: Secondary | ICD-10-CM | POA: Insufficient documentation

## 2020-10-27 DIAGNOSIS — R262 Difficulty in walking, not elsewhere classified: Secondary | ICD-10-CM | POA: Insufficient documentation

## 2020-10-27 DIAGNOSIS — R2689 Other abnormalities of gait and mobility: Secondary | ICD-10-CM | POA: Insufficient documentation

## 2020-10-27 NOTE — Therapy (Signed)
[facility] [address]  [place], [place], [address] Phone: [phone]   Fax:  [phone]  Physical Therapy Treatment  Patient Details  Name: Andrew Banks MRN: [id number] Date of Birth: 05-03-1950 Referring Provider (PT): Lowella Dandy, MD   Encounter Date: 10/27/2020   PT End of Session - 10/27/20 1524     Visit Number 24    Number of Visits 45    Date for PT Re-Evaluation 11/26/20    Authorization Type Medicare & VA    Authorization - Number of Visits 45    Progress Note Due on Visit 30    PT Start Time 1445    PT Stop Time 1527    PT Time Calculation (min) 42 min    Equipment Utilized During Treatment Gait belt    Activity Tolerance Patient tolerated treatment well    Behavior During Therapy WFL for tasks assessed/performed             Past Medical History:  Diagnosis Date   Arthritis    Degenerative disc disease, lumbar    Hypertension    Liver transplanted [facility])    Skin cancer     Past Surgical History:  Procedure Laterality Date   liver transplant      There were no vitals filed for this visit.   Subjective Assessment - 10/27/20 1449     Subjective Pt. reports he has had all kinds of dr. appt. including neurologist who looked at ankle, wants MRI. and told patient that he may need surgery if it is due to fractured L4.  On Oct 25 he is getting Mohs surgery to review skin cancer on L leg.    Pertinent History skin CA, liver transplant, HTN, lumbar DDD    Limitations Sitting;Lifting;Standing;Walking;House hold activities    Patient Stated Goals decrease pain    Currently in Pain? Yes    Pain Score 6     Pain Location Leg    Pain Orientation Left    Pain Descriptors / Indicators Aching    Pain Type Acute pain    Pain Radiating Towards radiating from low back down leg to ankle                               [facility] Adult PT Treatment/Exercise - 10/27/20  0001       Self-Care   Self-Care Other Self-Care Comments    Other Self-Care Comments  see PT education, used anatomical models for education on findings      Exercises   Exercises Ankle;Lumbar      Lumbar Exercises: Stretches   Lower Trunk Rotation Other (comment);Limitations   2 x 10   Lower Trunk Rotation Limitations side of preference    Pelvic Tilt 20 reps;5 seconds    Other Lumbar Stretch Exercise seated upper trunk rotations 2 x 10 side of preference      Lumbar Exercises: Aerobic   Nustep L5 x 6 min UE and LE      Lumbar Exercises: Supine   Bridge 20 reps    Bridge Limitations with PPT, cues for breathing      Ankle Exercises: Seated   Other Seated Ankle Exercises ankle eversion x 10, with YTB 2 x 10, ankle DF x 10, with YTB 2 x 10, seated marching with YTB on L 2 x 10  PT Education - 10/27/20 1722     Education Details reviewed and progressed L ankle strengthening, educated on anatomy and x-ray findings, TMR exercises to decrease pain.    Person(s) Educated Patient    Methods Explanation;Demonstration;Verbal cues    Comprehension Verbalized understanding;Returned demonstration              PT Short Term Goals - 07/16/20 1526       PT SHORT TERM GOAL #1   Title Patient to be independent with initial HEP.    Time 3    Period Weeks    Status Achieved    Target Date 06/23/20               PT Long Term Goals - 09/17/20 1456       PT LONG TERM GOAL #1   Title Patient to be independent with advanced HEP.    Time 10    Period Weeks    Status Partially Met   met for current   Target Date 11/26/20      PT LONG TERM GOAL #2   Title Patient to demonstrate B LE strength >/=4/5.    Time 10    Period Weeks    Status Partially Met   improving, weakness L HS, L glut, L ankle eversion   Target Date 11/26/20      PT LONG TERM GOAL #3   Title Patient to score <15 sec on 5xSTS in order to decrease risk of falls.    Time 8     Period Weeks    Status Achieved   12.5 sec without UE     PT LONG TERM GOAL #4   Title Patient to score <14 sec on TUG with LRAD in order to decrease risk of falls.    Time 10    Period Weeks    Status Achieved    Target Date 11/26/20      PT LONG TERM GOAL #5   Title Patient to score atleast 45/56 on BERG in order to decrease risk of falls.    Time 10    Period Weeks    Status On-going   39/56   Target Date 11/26/20                   Plan - 10/27/20 1718     Clinical Impression Statement Patient reports he was seen by spinal specialist and orthopedist who retook X-rays - all hardware is in place, no acute fractures, grade 1 anterolisthesis of L5, and degenerative changes throughout lumbar spine, L hip and knee were primary findings.  He did report that MRI of lumbar spine has been ordered as doctor suspects that his L leg pain and decreased L ankle strength are coming from lumbar spine, and patient also reports that he is being referred to orthotist for bracing.  Today reviewed ankle exercises and added YTB to improve ankle eversion, and then reviewed core strengthening and also educated on TMR exercises focusing on shortening tight muscles to decrease pain and improve ROM - he did report decreased pain in back with upper trunk and lower trunk twists to preferred side.  He would benefit from continued skilled therapy to improve strength and mobility and decrease pain.    PT Treatment/Interventions ADLs/Self Care Home Management;Cryotherapy;Electrical Stimulation;Iontophoresis 82m/ml Dexamethasone;Moist Heat;Balance training;Therapeutic exercise;Therapeutic activities;Functional mobility training;Stair training;Gait training;DME Instruction;Ultrasound;Neuromuscular re-education;Patient/family education;Manual techniques;Vasopneumatic Device;Taping;Energy conservation;Dry needling;Passive range of motion;Scar mobilization    PT Next Visit Plan On land therapist evaluate for  potntial  need for left ankle orthotic/brace.    PT Home Exercise Plan [phone]             Patient will benefit from skilled therapeutic intervention in order to improve the following deficits and impairments:  Abnormal gait, Hypomobility, Increased edema, Decreased activity tolerance, Decreased strength, Increased fascial restricitons, Pain, Decreased balance, Decreased mobility, Difficulty walking, Increased muscle spasms, Improper body mechanics, Decreased range of motion, Impaired flexibility, Postural dysfunction  Visit Diagnosis: Muscle weakness (generalized)  Chronic pain of left knee  Difficulty in walking, not elsewhere classified  Other abnormalities of gait and mobility  Chronic left-sided low back pain with left-sided sciatica     Problem List There are no problems to display for this patient.   Rennie Natter, PT, DPT 10/27/2020, 5:26 PM  [facility] [address]  [place], [place], [address] Phone: [phone]   Fax:  [phone]  Name: Augusto Deckman MRN: [id number] Date of Birth: [birth year]

## 2020-10-29 ENCOUNTER — Other Ambulatory Visit: Payer: Self-pay

## 2020-10-29 ENCOUNTER — Ambulatory Visit (HOSPITAL_BASED_OUTPATIENT_CLINIC_OR_DEPARTMENT_OTHER): Payer: No Typology Code available for payment source | Admitting: Physical Therapy

## 2020-10-29 ENCOUNTER — Encounter (HOSPITAL_BASED_OUTPATIENT_CLINIC_OR_DEPARTMENT_OTHER): Payer: Self-pay | Admitting: Physical Therapy

## 2020-10-29 DIAGNOSIS — R262 Difficulty in walking, not elsewhere classified: Secondary | ICD-10-CM

## 2020-10-29 DIAGNOSIS — M6281 Muscle weakness (generalized): Secondary | ICD-10-CM | POA: Diagnosis not present

## 2020-10-29 DIAGNOSIS — G8929 Other chronic pain: Secondary | ICD-10-CM

## 2020-10-29 DIAGNOSIS — R2689 Other abnormalities of gait and mobility: Secondary | ICD-10-CM

## 2020-10-29 NOTE — Therapy (Addendum)
PHYSICAL THERAPY DISCHARGE SUMMARY (11/25/20)  Visits from Start of Care: 25  Current functional level related to goals / functional outcomes: See goals below. Patient continues to have impairments in LE strength, balance, and endurance.     Remaining deficits: LE strength, balance, endurance, pain   Education / Equipment: HEP  Plan: Patient goals were not met. Patient is being discharged due to surgery following femur fracture after fall  He will need new order to return to PT once cleared by MD.     Rennie Natter, PT, DPT 11/25/2020   [place], [place], [address] Phone: [phone]   Fax:  [phone]  Physical Therapy Treatment  Patient Details  Name: Andrew Banks MRN: [id number] Date of Birth: 04-07-1950 Referring Provider (PT): Lowella Dandy, MD   Encounter Date: 10/29/2020   PT End of Session - 10/29/20 1208     Visit Number 25    Number of Visits 45    Date for PT Re-Evaluation 11/26/20    Authorization Type Medicare & VA    Authorization - Number of Visits 45    Progress Note Due on Visit 30    PT Start Time 1201    PT Stop Time 1245    PT Time Calculation (min) 44 min    Equipment Utilized During Treatment Gait belt    Activity Tolerance Patient tolerated treatment well    Behavior During Therapy WFL for tasks assessed/performed             Past Medical History:  Diagnosis Date   Arthritis    Degenerative disc disease, lumbar    Hypertension    Liver transplanted [facility])    Skin cancer     Past Surgical History:  Procedure Laterality Date   liver transplant      There were no vitals filed for this visit.   Subjective Assessment - 10/29/20 1326     Subjective Pt recaps all md appointments and findings, some increase in discomfort today may be due to added core ex on land as well as barometric pressure changes.    Currently in Pain? Yes    Pain  Score 7     Pain Score 7    Pain Score 7                                          PT Short Term Goals - 07/16/20 1526       PT SHORT TERM GOAL #1   Title Patient to be independent with initial HEP.    Time 3    Period Weeks    Status Achieved    Target Date 06/23/20               PT Long Term Goals - 09/17/20 1456       PT LONG TERM GOAL #1   Title Patient to be independent with advanced HEP.    Time 10    Period Weeks    Status Partially Met   met for current   Target Date 11/26/20      PT LONG TERM GOAL #2   Title Patient to demonstrate B LE strength >/=4/5.    Time 10    Period Weeks    Status Partially Met   improving, weakness L HS, L glut, L ankle eversion   Target  Date 11/26/20      PT LONG TERM GOAL #3   Title Patient to score <15 sec on 5xSTS in order to decrease risk of falls.    Time 8    Period Weeks    Status Achieved   12.5 sec without UE     PT LONG TERM GOAL #4   Title Patient to score <14 sec on TUG with LRAD in order to decrease risk of falls.    Time 10    Period Weeks    Status Achieved    Target Date 11/26/20      PT LONG TERM GOAL #5   Title Patient to score atleast 45/56 on BERG in order to decrease risk of falls.    Time 10    Period Weeks    Status On-going   39/56   Target Date 11/26/20            TREATMENT Pt seen for aquatic therapy today.  Treatment took place in water 3.25-4.8 ft in depth at the [facility] pool. Temp of water was 91.  Pt entered/exited the pool via stairs step to pattern cga with bilat rail.   Water walking forward, backward and side stepping x 4  widths of pool.  Cueing for step length and pacing as tolerated with knee and LB discomfort. LB stretching in pike position holding to handrails 3 x 30 sec hold, right then left hip hiking 3 x 30 sec hold each. Runners stretch hamstring and gasroc stretch 3 x 20 sec hold   Seated on water bench, submerged  70% Stretching gastroc, hamstring and adductors active and passive 3 x 20-30 sec hold   Standing Core strenthening  Using 2 foam hand buoys: In 4.8 ft hand buoys submerged 2 inches - knees to chest x 10 reps - standing<>supine x10 -Standing <> prone x 10   Prone suspension  Horizontal abd with 2 foam buoy x 5 then x 10.  Pt with difficulty maintaining prone positioning with buoys submerged due to decreased peck strength.  Vertical suspension using hand buoys: Core strengthening and aerobic capacity challenges bicyclicing 5 trials of 20 fast revolutions then 20 slow.   Pt requires VC, and demonstration for proper execution as well as several rest periods.     Pt requires buoyancy for support and to offload joints with strengthening exercises. Viscosity of the water is needed for resistance of strengthening; water current perturbations provides challenge to standing balance unsupported, requiring increased core activation.     Patient will benefit from skilled therapeutic intervention in order to improve the following deficits and impairments:  Abnormal gait, Hypomobility, Increased edema, Decreased activity tolerance, Decreased strength, Increased fascial restricitons, Pain, Decreased balance, Decreased mobility, Difficulty walking, Increased muscle spasms, Improper body mechanics, Decreased range of motion, Impaired flexibility, Postural dysfunction  Visit Diagnosis: Muscle weakness (generalized)  Chronic left-sided low back pain with left-sided sciatica  Chronic pain of left knee  Difficulty in walking, not elsewhere classified  Other abnormalities of gait and mobility  Clinical assessment Some increased genral discomfort. Once submerged decreases. Pt reporting knee surgery scheduled for later in October. Focus today is on stretching and core strength. Pt with some pec weakness as demonstrated with prone horizontal abd.Able to advance higher level core strengthening exercises.  Gentle LB stretching.    Problem List There are no problems to display for this patient.   Stanton Kidney (Frankie) Slyvia Lartigue MPT   10/29/2020, 1:34 PM  [facility]  [place], [place], [address] Phone: [phone]   Fax:  [phone]  Name: Salbador Fiveash MRN: [id number] Date of Birth: [birth year]

## 2020-11-03 ENCOUNTER — Encounter: Payer: Non-veteran care | Admitting: Physical Therapy

## 2020-11-05 ENCOUNTER — Ambulatory Visit (HOSPITAL_BASED_OUTPATIENT_CLINIC_OR_DEPARTMENT_OTHER): Payer: Non-veteran care | Admitting: Physical Therapy

## 2020-11-10 ENCOUNTER — Encounter: Payer: Non-veteran care | Admitting: Physical Therapy

## 2020-11-12 ENCOUNTER — Ambulatory Visit (HOSPITAL_BASED_OUTPATIENT_CLINIC_OR_DEPARTMENT_OTHER): Payer: Non-veteran care | Admitting: Physical Therapy

## 2020-11-17 ENCOUNTER — Ambulatory Visit: Payer: No Typology Code available for payment source | Admitting: Physical Therapy

## 2020-11-19 ENCOUNTER — Ambulatory Visit (HOSPITAL_BASED_OUTPATIENT_CLINIC_OR_DEPARTMENT_OTHER): Payer: Non-veteran care | Admitting: Physical Therapy

## 2021-03-16 ENCOUNTER — Other Ambulatory Visit: Payer: Self-pay

## 2021-03-16 ENCOUNTER — Encounter: Payer: Self-pay | Admitting: Physical Therapy

## 2021-03-16 ENCOUNTER — Ambulatory Visit: Payer: No Typology Code available for payment source | Attending: Emergency Medicine | Admitting: Physical Therapy

## 2021-03-16 DIAGNOSIS — R262 Difficulty in walking, not elsewhere classified: Secondary | ICD-10-CM

## 2021-03-16 DIAGNOSIS — M6281 Muscle weakness (generalized): Secondary | ICD-10-CM | POA: Diagnosis present

## 2021-03-16 DIAGNOSIS — R2681 Unsteadiness on feet: Secondary | ICD-10-CM | POA: Insufficient documentation

## 2021-03-16 DIAGNOSIS — R2689 Other abnormalities of gait and mobility: Secondary | ICD-10-CM | POA: Diagnosis present

## 2021-03-16 DIAGNOSIS — M5442 Lumbago with sciatica, left side: Secondary | ICD-10-CM | POA: Diagnosis present

## 2021-03-16 DIAGNOSIS — M25562 Pain in left knee: Secondary | ICD-10-CM | POA: Diagnosis not present

## 2021-03-16 DIAGNOSIS — G8929 Other chronic pain: Secondary | ICD-10-CM | POA: Diagnosis present

## 2021-03-16 NOTE — Therapy (Signed)
[facility] [address]  [place], [place], [address] Phone: [phone]   Fax:  [phone]  Physical Therapy Evaluation  Patient Details  Name: Andrew Banks MRN: [id number] Date of Birth: Sep 30, 1950 Referring Provider (PT): Rex Kras, [place] PA-C   Encounter Date: 03/16/2021   PT End of Session - 03/16/21 1813     Visit Number 1    Number of Visits 15    Date for PT Re-Evaluation 05/11/21    Authorization Type VA    Authorization - Visit Number 1    Authorization - Number of Visits 15    Progress Note Due on Visit 10    PT Start Time 1703    PT Stop Time 1745    PT Time Calculation (min) 42 min    Activity Tolerance Patient tolerated treatment well    Behavior During Therapy [facility] for tasks assessed/performed             Past Medical History:  Diagnosis Date   Arthritis    Degenerative disc disease, lumbar    Hypertension    Liver transplanted [facility])    Skin cancer     Past Surgical History:  Procedure Laterality Date   liver transplant      There were no vitals filed for this visit.    Subjective Assessment - 03/16/21 1705     Subjective Pt. was walking in kitchen with cane, when turned and fell, cracking L femur, had to replace IM nail that was already in his femur from previous fracutre, then another rod to secure ball of femur where the new fracture was.  He has been working with [facility] and has been discharged.   Also had new hardward in L knee which has really messed it up.  Has custom hinged AFO now.    Pertinent History s/p ORIF Pelvic fx 11/12/19;  s/p IMN L distal femur 08/12/2019;  s/p L IM nailing femur 11/02/20;  L foot dropskin CA, liver transplant, HTN, lumbar DDD    Diagnostic tests XR 12/22/20: 1.  Healing unstable traumatic U-shaped sacral fracture (spinopelvic dissociation) status post internal fixation with bilateral iliosacral screws at S1 and A left transsacral transiliac screw at S2.  Both hips are located. No hardware complication. No gross change in alignment.   2.  Healed left ischiopubic ramus fracture.   3.  Healing peri-implant pertrochanteric left femoral fracture status post cephalomedullary nail fixation. No hardware is intact without loosening. No change in fracture alignment.   4.  Healed bicondylar distal femoral fracture (extending proximally to the distal third diaphysis) status post ORIF with lateral buttress plate and screws. No hardware complication or change in fracture alignment.   5.  Medial patellar fracture is is not visible on this examination and seen on the CT left knee dated 11/11/2019.   6.  Healed nondisplaced fibular neck fracture.   7.  No new fractures.   8.  No joint misalignment (both sacroiliac, pubic symphysis, and both hips, and left knee).   9.  Status post embolization of multiple arteries (left L3 and L4 lumbar, right L4 lumbar, 2 branches of the left deep circumflex iliac artery, and bilateral iliolumbar arteries (per Interventional Radiology procedure note dated 11/10/2020) with multiple embolization coils in and around the anatomic pelvis. Vascular plug in the right groin.   10.  Status post ACL reconstruction. Femoral interference screw and partially imaged tibial screw are intact without loosening.   11.  Osteopenia.  12.  Moderate degenerative changes of the knee.   13.  Mild degenerative changes of the hips and pubic symphysis.   14.  Degenerative changes of the partially imaged lumbosacral spine.   15.  Partially imaged mesh tacks in the abdomen (along the ventral abdominal wall when correlating with prior CT abdomen/pelvis dated 11/16/2019).    Patient Stated Goals improve LLE strength, improve L knee ROM, not fall again    Currently in Pain? Yes    Pain Score 5     Pain Location Leg    Pain Orientation Left    Pain Descriptors / Indicators Aching    Pain Type Other (Comment);Acute pain   due to broken bone   Pain Onset More than a month  ago   11/02/2020   Pain Frequency Constant    Multiple Pain Sites Yes    Pain Score 8    Pain Location Knee    Pain Orientation Left    Pain Descriptors / Indicators Sharp    Pain Type Acute pain    Pain Onset More than a month ago    Aggravating Factors  bending knee due to hardware in knee                [facility] PT Assessment - 03/16/21 0001       Assessment   Medical Diagnosis M79.606 (ICD-10-CM) - Leg pain    Referring Provider (PT) Little, Traci PA-C    Onset Date/Surgical Date 11/02/20    Next MD Visit 03/30/2021    Prior Therapy [facility]      Precautions   Precautions Fall      Restrictions   Weight Bearing Restrictions No      Balance Screen   Has the patient fallen in the past 6 months Yes    How many times? 1    Has the patient had a decrease in activity level because of a fear of falling?  Yes    Is the patient reluctant to leave their home because of a fear of falling?  Yes      [place] Private residence    Living Arrangements Spouse/significant other    Available Help at Discharge Family    Type of [place] to enter    Entrance Stairs-Number of Steps [place] Two level    Alternate Level Stairs-Number of Steps 3+7    Alternate Level Stairs-R[place] - 4 wheels;Cane - single point;Wheelchair - manual;Shower seat;Grab bars - toilet;Grab bars - tub/shower      Prior Function   Level of Independence Requires assistive device for independence      Cognition   Overall Cognitive Status Within Functional Limits for tasks assessed      Observation/Other Assessments   Observations enters with 4WRW, partial WB through walker,  L AFO, no apparent distress    Skin [facility] reports healing wound on L lower leg from skin CA removal, not visualed    Focus on Therapeutic Outcomes (FOTO)  leg: 47%.  59% predicted at [employer].      ROM /  Strength   AROM / PROM / Strength Strength      Strength   Overall Strength Deficits    Overall Strength Comments 5/5 hip/knee strength, L ankle strength impaired, foot drop, wearing L AFO today so not formally assessed  Ambulation/Gait   Ambulation/Gait Yes    Ambulation/Gait Assistance 5: Supervision    Ambulation/Gait Assistance Details CGA for safety    Ambulation Distance (Feet) 375 Feet    Assistive device Straight cane    Gait Pattern Step-through pattern;Lateral trunk lean to right;Wide base of support      6 minute walk test results    Aerobic Endurance Distance Walked 375    Endurance additional comments with SPC, 2 seated rest breaks needed      Balance   Balance Assessed Yes      Static Standing Balance   Static Standing - Balance Support No upper extremity supported    Static Standing - Level of Assistance 5: Stand by assistance    Static Standing Balance -  Activities  Romberg - Eyes Opened;Romberg - Eyes Closed;Sharpened Romberg - Eyes Open    Static Standing - Comment/# of Minutes able to maintain each position for 30 sec, unable to get into tandem stance due to hardward, increased sway with eyes closed, SBA throughout for safety.      Standardized Balance Assessment   Standardized Balance Assessment Five Times Sit to Stand    Five times sit to stand comments  13 seconds without UE assist                        Objective measurements completed on examination: See above findings.                  PT Short Term Goals - 03/16/21 1821       PT SHORT TERM GOAL #1   Title Patient to be independent with initial HEP.    Time 2    Period Weeks    Status New    Target Date 03/30/21               PT Long Term Goals - 03/16/21 1821       PT LONG TERM GOAL #1   Title Patient to be independent with advanced HEP.    Time 8    Period Weeks    Status New    Target Date 05/11/21      PT LONG TERM GOAL #2   Title Patient  will demonstrate improved endurance by completing 900' in 6MWT to access community.    Baseline 375' with 2 seated rest breaks needed, SPC used    Time 8    Period Weeks    Status New    Target Date 05/11/21      PT LONG TERM GOAL #3   Title Patient will score >19/24 on DGI to decrease risk of falls.    Baseline NT today    Time 8    Period Weeks    Status New    Target Date 05/11/21      PT LONG TERM GOAL #4   Title Pt. will report 50% improvement in LLE pain.    Time 8    Period Weeks    Status New    Target Date 05/11/21      PT LONG TERM GOAL #5   Title Patient to score atleast 45/56 on BERG in order to decrease risk of falls.    Time 8    Period Weeks    Status New    Target Date 05/11/21                    Plan - 03/16/21 1800  Clinical Impression Statement Andrew Banks is returning to OPPT s/p L IM nailing and femur fx following fall on 11/02/2020.  He reports decreased endurance, LLE pain, and impaired balance.  Today he demonstrates poor endurance, only completing 375' during 6MWT with 2 seated rest breaks needed; impaired balance unable to maintain tandem stance; and L hip/knee pain that increases with gait and L knee ROM.  He would benefit from skilled physical therapy to decrease LLE pain, improve endurance and balance and decrease risk of falls.    Personal Factors and Comorbidities Comorbidity 3+    Comorbidities skin CA, liver transplant, HTN, lumbar DDD    Examination-Activity Limitations Bend;Carry;Locomotion Level;Squat;Transfers;Stand;Stairs    Examination-Participation Restrictions Yard Work;Cleaning;Laundry;Community Activity;Driving;Meal Prep;Shop    Stability/Clinical Decision Making Evolving/Moderate complexity    Clinical Decision Making Moderate    Rehab Potential Good    PT Frequency 2x / week    PT Duration 8 weeks    PT Treatment/Interventions ADLs/Self Care Home Management;Gait training;Stair training;Functional mobility  training;Therapeutic activities;Therapeutic exercise;Balance training;Neuromuscular re-education;Patient/family education;Manual techniques;Iontophoresis 4mg /ml Dexamethasone;Moist Heat;Cryotherapy;Passive range of motion;Dry needling;Joint Manipulations    PT Next Visit Plan LE strengthening and endurance    Consulted and Agree with Plan of Care Patient             Patient will benefit from skilled therapeutic intervention in order to improve the following deficits and impairments:  Abnormal gait, Decreased activity tolerance, Decreased endurance, Decreased range of motion, Decreased strength, Impaired sensation, Pain, Decreased balance, Decreased mobility, Decreased safety awareness, Difficulty walking, Increased muscle spasms  Visit Diagnosis: Chronic pain of left knee  Muscle weakness (generalized)  Difficulty in walking, not elsewhere classified  Unsteadiness on feet     Problem List There are no problems to display for this patient.   Rennie Natter, PT, DPT  03/16/2021, 6:29 PM  [facility] [address]  [place], [place], [address] Phone: [phone]   Fax:  [phone]  Name: Andrew Banks MRN: [id number] Date of Birth: Jul 07, 1950

## 2021-03-18 ENCOUNTER — Ambulatory Visit: Payer: No Typology Code available for payment source

## 2021-03-18 ENCOUNTER — Other Ambulatory Visit: Payer: Self-pay

## 2021-03-18 DIAGNOSIS — R262 Difficulty in walking, not elsewhere classified: Secondary | ICD-10-CM

## 2021-03-18 DIAGNOSIS — R2689 Other abnormalities of gait and mobility: Secondary | ICD-10-CM

## 2021-03-18 DIAGNOSIS — G8929 Other chronic pain: Secondary | ICD-10-CM

## 2021-03-18 DIAGNOSIS — M25562 Pain in left knee: Secondary | ICD-10-CM | POA: Diagnosis not present

## 2021-03-18 DIAGNOSIS — M6281 Muscle weakness (generalized): Secondary | ICD-10-CM

## 2021-03-18 DIAGNOSIS — R2681 Unsteadiness on feet: Secondary | ICD-10-CM

## 2021-03-18 NOTE — Addendum Note (Signed)
Addended by: Rennie Natter on: 03/18/2021 12:22 PM   Modules accepted: Orders

## 2021-03-18 NOTE — Therapy (Signed)
[facility] [address]  [place], [place], [address] Phone: [phone]   Fax:  [phone]  Physical Therapy Treatment  Patient Details  Name: Andrew Banks MRN: [id number] Date of Birth: April 04, 1950 Referring Provider (PT): Rex Kras, [place] PA-C   Encounter Date: 03/18/2021   PT End of Session - 03/18/21 1702     Visit Number 2    Number of Visits 15    Date for PT Re-Evaluation 05/11/21    Authorization Type [facility]    Authorization - Visit Number 2    Authorization - Number of Visits 15    Progress Note Due on Visit 10    PT Start Time 1623   pt late   PT Stop Time 1700    PT Time Calculation (min) 37 min    Activity Tolerance Patient tolerated treatment well    Behavior During Therapy [facility] for tasks assessed/performed             Past Medical History:  Diagnosis Date   Arthritis    Degenerative disc disease, lumbar    Hypertension    Liver transplanted [facility])    Skin cancer     Past Surgical History:  Procedure Laterality Date   liver transplant      There were no vitals filed for this visit.   Subjective Assessment - 03/18/21 1631     Subjective Pt reports continued pain along his R groin and knee, came in with cane because of rushing.    Pertinent History s/p ORIF Pelvic fx 11/12/19;  s/p IMN L distal femur 08/12/2019;  s/p L IM nailing femur 11/02/20;  L foot dropskin CA, liver transplant, HTN, lumbar DDD    Diagnostic tests XR 12/22/20: 1.  Healing unstable traumatic U-shaped sacral fracture (spinopelvic dissociation) status post internal fixation with bilateral iliosacral screws at S1 and A left transsacral transiliac screw at S2. Both hips are located. No hardware complication. No gross change in alignment.   2.  Healed left ischiopubic ramus fracture.   3.  Healing peri-implant pertrochanteric left femoral fracture status post cephalomedullary nail fixation. No hardware is intact without loosening.  No change in fracture alignment.   4.  Healed bicondylar distal femoral fracture (extending proximally to the distal third diaphysis) status post ORIF with lateral buttress plate and screws. No hardware complication or change in fracture alignment.   5.  Medial patellar fracture is is not visible on this examination and seen on the CT left knee dated 11/11/2019.   6.  Healed nondisplaced fibular neck fracture.   7.  No new fractures.   8.  No joint misalignment (both sacroiliac, pubic symphysis, and both hips, and left knee).   9.  Status post embolization of multiple arteries (left L3 and L4 lumbar, right L4 lumbar, 2 branches of the left deep circumflex iliac artery, and bilateral iliolumbar arteries (per Interventional Radiology procedure note dated 11/10/2020) with multiple embolization coils in and around the anatomic pelvis. Vascular plug in the right groin.   10.  Status post ACL reconstruction. Femoral interference screw and partially imaged tibial screw are intact without loosening.   11.  Osteopenia.   12.  Moderate degenerative changes of the knee.   13.  Mild degenerative changes of the hips and pubic symphysis.   14.  Degenerative changes of the partially imaged lumbosacral spine.   15.  Partially imaged mesh tacks in the abdomen (along the ventral abdominal wall when correlating with  prior CT abdomen/pelvis dated 11/16/2019).    Patient Stated Goals improve LLE strength, improve L knee ROM, not fall again    Currently in Pain? Yes    Pain Score 6     Pain Location Hip    Pain Orientation Left    Pain Descriptors / Indicators Aching    Pain Type Acute pain                               [facility] Adult PT Treatment/Exercise - 03/18/21 0001       Ambulation/Gait   Ambulation/Gait Yes    Ambulation/Gait Assistance 5: Supervision    Ambulation Distance (Feet) 90 Feet    Assistive device Straight cane    Gait Pattern Step-through pattern;Lateral trunk lean to right;Wide  base of support      Exercises   Exercises Knee/Hip      Knee/Hip Exercises: Aerobic   Nustep L4x31min      Knee/Hip Exercises: Standing   Hip Flexion Stengthening;Both;10 reps;Knee straight    Hip Flexion Limitations 1lb with 2HA    Hip Extension Stengthening;Both;10 reps;Knee straight    Extension Limitations 1lb with 2HA      Knee/Hip Exercises: Seated   Long Arc Quad Strengthening;Both;10 reps    Long Arc Quad Limitations with VMO ball squeeze    Ball Squeeze 10x3 sec hold    Sit to [employer] 2 sets;10 reps   cues needed for equal foot position                      PT Short Term Goals - 03/18/21 1637       PT SHORT TERM GOAL #1   Title Patient to be independent with initial HEP.    Time 2    Period Weeks    Status On-going    Target Date 03/30/21               PT Long Term Goals - 03/18/21 1637       PT LONG TERM GOAL #1   Title Patient to be independent with advanced HEP.    Time 8    Period Weeks    Status On-going    Target Date 05/11/21      PT LONG TERM GOAL #2   Title Patient will demonstrate improved endurance by completing 900' in 6MWT to access community.    Baseline 375' with 2 seated rest breaks needed, SPC used    Time 8    Period Weeks    Status On-going    Target Date 05/11/21      PT LONG TERM GOAL #3   Title Patient will score >19/24 on DGI to decrease risk of falls.    Baseline NT today    Time 8    Period Weeks    Status On-going    Target Date 05/11/21      PT LONG TERM GOAL #4   Title Pt. will report 50% improvement in LLE pain.    Time 8    Period Weeks    Status New    Target Date 05/11/21      PT LONG TERM GOAL #5   Title Patient to score atleast 45/56 on BERG in order to decrease risk of falls.    Time 8    Period Weeks    Status On-going    Target Date 05/11/21  Plan - 03/18/21 1703     Clinical Impression Statement Pt demonstrated some generalized fatigue and low  endurance. 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 Postural cues needed with standing ther ex and cues needed for controlled motion for max muscle recruitment. We were limited with time due to his late arrival, will plan to continue progressing functional strength.    Personal Factors and Comorbidities Comorbidity 3+    Comorbidities skin CA, liver transplant, HTN, lumbar DDD    PT Frequency 2x / week    PT Duration 8 weeks    PT Treatment/Interventions ADLs/Self Care Home Management;Gait training;Stair training;Functional mobility training;Therapeutic activities;Therapeutic exercise;Balance training;Neuromuscular re-education;Patient/family education;Manual techniques;Iontophoresis 4mg /ml Dexamethasone;Moist Heat;Cryotherapy;Passive range of motion;Dry needling;Joint Manipulations    PT Next Visit Plan LE strengthening and endurance    Consulted and Agree with Plan of Care Patient             Patient will benefit from skilled therapeutic intervention in order to improve the following deficits and impairments:  Abnormal gait, Decreased activity tolerance, Decreased endurance, Decreased range of motion, Decreased strength, Impaired sensation, Pain, Decreased balance, Decreased mobility, Decreased safety awareness, Difficulty walking, Increased muscle spasms  Visit Diagnosis: Chronic pain of left knee  Muscle weakness (generalized)  Difficulty in walking, not elsewhere classified  Unsteadiness on feet  Chronic left-sided low back pain with left-sided sciatica  Other abnormalities of gait and mobility     Problem List There are no problems to display for this patient.   Artist Pais, PTA 03/18/2021, 5:52 PM  [facility] [address]  [place], [place], [address] Phone: [phone]   Fax:  [phone]  Name: Andrew Banks MRN:  [id number] Date of Birth: 1950/06/16

## 2021-03-23 ENCOUNTER — Ambulatory Visit: Payer: No Typology Code available for payment source

## 2021-03-24 ENCOUNTER — Encounter: Payer: Self-pay | Admitting: Physical Therapy

## 2021-03-24 ENCOUNTER — Ambulatory Visit: Payer: No Typology Code available for payment source | Admitting: Physical Therapy

## 2021-03-24 ENCOUNTER — Other Ambulatory Visit: Payer: Self-pay

## 2021-03-24 DIAGNOSIS — M25562 Pain in left knee: Secondary | ICD-10-CM | POA: Diagnosis not present

## 2021-03-24 DIAGNOSIS — M6281 Muscle weakness (generalized): Secondary | ICD-10-CM

## 2021-03-24 DIAGNOSIS — G8929 Other chronic pain: Secondary | ICD-10-CM

## 2021-03-24 DIAGNOSIS — R2689 Other abnormalities of gait and mobility: Secondary | ICD-10-CM

## 2021-03-24 DIAGNOSIS — R262 Difficulty in walking, not elsewhere classified: Secondary | ICD-10-CM

## 2021-03-24 DIAGNOSIS — R2681 Unsteadiness on feet: Secondary | ICD-10-CM

## 2021-03-24 NOTE — Therapy (Signed)
[facility] [address]  [place], [place], [address] Phone: [phone]   Fax:  [phone]  Physical Therapy Treatment  Patient Details  Name: Andrew Banks MRN: [id number] Date of Birth: Jul 07, 1950 Referring Provider (PT): Rex Kras, [place] PA-C   Encounter Date: 03/24/2021   PT End of Session - 03/24/21 1351     Visit Number 3    Number of Visits 15    Date for PT Re-Evaluation 05/11/21    Authorization Type [facility]    Authorization - Visit Number 3    Authorization - Number of Visits 15    Progress Note Due on Visit 10    PT Start Time 7510    PT Stop Time 1430    PT Time Calculation (min) 42 min    Activity Tolerance Patient tolerated treatment well    Behavior During Therapy [facility] for tasks assessed/performed             Past Medical History:  Diagnosis Date   Arthritis    Degenerative disc disease, lumbar    Hypertension    Liver transplanted [facility])    Skin cancer     Past Surgical History:  Procedure Laterality Date   liver transplant      There were no vitals filed for this visit.   Subjective Assessment - 03/24/21 1351     Subjective It lets me know it's there.    Pertinent History s/p ORIF Pelvic fx 11/12/19;  s/p IMN L distal femur 08/12/2019;  s/p L IM nailing femur 11/02/20;  L foot dropskin CA, liver transplant, HTN, lumbar DDD    Diagnostic tests XR 12/22/20: 1.  Healing unstable traumatic U-shaped sacral fracture (spinopelvic dissociation) status post internal fixation with bilateral iliosacral screws at S1 and A left transsacral transiliac screw at S2. Both hips are located. No hardware complication. No gross change in alignment.   2.  Healed left ischiopubic ramus fracture.   3.  Healing peri-implant pertrochanteric left femoral fracture status post cephalomedullary nail fixation. No hardware is intact without loosening. No change in fracture alignment.   4.  Healed bicondylar distal femoral  fracture (extending proximally to the distal third diaphysis) status post ORIF with lateral buttress plate and screws. No hardware complication or change in fracture alignment.   5.  Medial patellar fracture is is not visible on this examination and seen on the CT left knee dated 11/11/2019.   6.  Healed nondisplaced fibular neck fracture.   7.  No new fractures.   8.  No joint misalignment (both sacroiliac, pubic symphysis, and both hips, and left knee).   9.  Status post embolization of multiple arteries (left L3 and L4 lumbar, right L4 lumbar, 2 branches of the left deep circumflex iliac artery, and bilateral iliolumbar arteries (per Interventional Radiology procedure note dated 11/10/2020) with multiple embolization coils in and around the anatomic pelvis. Vascular plug in the right groin.   10.  Status post ACL reconstruction. Femoral interference screw and partially imaged tibial screw are intact without loosening.   11.  Osteopenia.   12.  Moderate degenerative changes of the knee.   13.  Mild degenerative changes of the hips and pubic symphysis.   14.  Degenerative changes of the partially imaged lumbosacral spine.   15.  Partially imaged mesh tacks in the abdomen (along the ventral abdominal wall when correlating with prior CT abdomen/pelvis dated 11/16/2019).    Patient Stated Goals improve LLE strength,  improve L knee ROM, not fall again    Currently in Pain? Yes    Pain Score 6     Pain Location Leg    Pain Orientation Left    Pain Descriptors / Indicators Aching    Pain Type Acute pain                               [facility] Adult PT Treatment/Exercise - 03/24/21 0001       Ambulation/Gait   Ambulation/Gait Yes    Ambulation/Gait Assistance 5: Supervision    Ambulation Distance (Feet) --   down and back outside hallway; 1 min rest on bench at end of hall   Assistive device Straight cane    Gait Pattern Step-through pattern;Lateral trunk lean to right;Wide base of  support      Knee/Hip Exercises: Aerobic   Nustep L5 x 7 min      Knee/Hip Exercises: Standing   Hip Flexion Stengthening;Both;10 reps;Knee straight    Hip Flexion Limitations 2 with 2HA    Hip Abduction Both;2 sets;10 reps    Abduction Limitations 2# c 2HA    Hip Extension Stengthening;Both;10 reps;Knee straight    Extension Limitations 2lb with 2HA      Knee/Hip Exercises: Seated   Long Arc Quad Strengthening;Both;10 reps;2 sets    Long Arc Quad Limitations 2# with VMO ball squeeze    Ball Squeeze 10x3 sec hold    Sit to [employer] 3 sets;10 reps   last set from foam pad on bench and using mirror for visual FB.                      PT Short Term Goals - 03/18/21 1637       PT SHORT TERM GOAL #1   Title Patient to be independent with initial HEP.    Time 2    Period Weeks    Status On-going    Target Date 03/30/21               PT Long Term Goals - 03/18/21 1637       PT LONG TERM GOAL #1   Title Patient to be independent with advanced HEP.    Time 8    Period Weeks    Status On-going    Target Date 05/11/21      PT LONG TERM GOAL #2   Title Patient will demonstrate improved endurance by completing 900' in 6MWT to access community.    Baseline 375' with 2 seated rest breaks needed, SPC used    Time 8    Period Weeks    Status On-going    Target Date 05/11/21      PT LONG TERM GOAL #3   Title Patient will score >19/24 on DGI to decrease risk of falls.    Baseline NT today    Time 8    Period Weeks    Status On-going    Target Date 05/11/21      PT LONG TERM GOAL #4   Title Pt. will report 50% improvement in LLE pain.    Time 8    Period Weeks    Status New    Target Date 05/11/21      PT LONG TERM GOAL #5   Title Patient to score atleast 45/56 on BERG in order to decrease risk of falls.    Time 8    Period  Weeks    Status On-going    Target Date 05/11/21                   Plan - 03/24/21 1435     Clinical Impression  Statement Pt able to progress strengthening exercises with greater weight today. He needs VCs for slower speed and no trunk lean. Good response with use of mirror with sit to stands to improve weight shift right. Still has significant tightness, popping and cracking in knee with LAQs. He would benefit from MT to quads/ITB.    Personal Factors and Comorbidities Comorbidity 3+    Comorbidities skin CA, liver transplant, HTN, lumbar DDD    Examination-Activity Limitations Bend;Carry;Locomotion Level;Squat;Transfers;Stand;Stairs    Examination-Participation Restrictions Yard Work;Cleaning;Laundry;Community Activity;Driving;Meal Prep;Shop    PT Frequency 2x / week    PT Duration 8 weeks    PT Treatment/Interventions ADLs/Self Care Home Management;Gait training;Stair training;Functional mobility training;Therapeutic activities;Therapeutic exercise;Balance training;Neuromuscular re-education;Patient/family education;Manual techniques;Iontophoresis 4mg /ml Dexamethasone;Moist Heat;Cryotherapy;Passive range of motion;Dry needling;Joint Manipulations    PT Next Visit Plan LE strengthening and endurance, MT to left quads/ITB    Consulted and Agree with Plan of Care Patient             Patient will benefit from skilled therapeutic intervention in order to improve the following deficits and impairments:  Abnormal gait, Decreased activity tolerance, Decreased endurance, Decreased range of motion, Decreased strength, Impaired sensation, Pain, Decreased balance, Decreased mobility, Decreased safety awareness, Difficulty walking, Increased muscle spasms  Visit Diagnosis: Chronic pain of left knee  Muscle weakness (generalized)  Difficulty in walking, not elsewhere classified  Unsteadiness on feet  Chronic left-sided low back pain with left-sided sciatica  Other abnormalities of gait and mobility     Problem List There are no problems to display for this patient.   Madelyn Flavors, PT 03/24/2021,  4:22 PM  [facility] [address]  [place], [place], [address] Phone: [phone]   Fax:  [phone]  Name: Andrew Banks MRN: [id number] Date of Birth: 03-17-1950

## 2021-03-30 ENCOUNTER — Ambulatory Visit: Payer: No Typology Code available for payment source

## 2021-04-06 ENCOUNTER — Ambulatory Visit: Payer: No Typology Code available for payment source | Attending: Emergency Medicine | Admitting: Physical Therapy

## 2021-04-06 ENCOUNTER — Other Ambulatory Visit: Payer: Self-pay

## 2021-04-06 ENCOUNTER — Encounter: Payer: Self-pay | Admitting: Physical Therapy

## 2021-04-06 DIAGNOSIS — M25562 Pain in left knee: Secondary | ICD-10-CM | POA: Insufficient documentation

## 2021-04-06 DIAGNOSIS — G8929 Other chronic pain: Secondary | ICD-10-CM | POA: Diagnosis present

## 2021-04-06 DIAGNOSIS — M5442 Lumbago with sciatica, left side: Secondary | ICD-10-CM | POA: Insufficient documentation

## 2021-04-06 DIAGNOSIS — R2689 Other abnormalities of gait and mobility: Secondary | ICD-10-CM | POA: Diagnosis present

## 2021-04-06 DIAGNOSIS — M6281 Muscle weakness (generalized): Secondary | ICD-10-CM | POA: Diagnosis present

## 2021-04-06 DIAGNOSIS — R2681 Unsteadiness on feet: Secondary | ICD-10-CM | POA: Diagnosis present

## 2021-04-06 DIAGNOSIS — R262 Difficulty in walking, not elsewhere classified: Secondary | ICD-10-CM | POA: Diagnosis present

## 2021-04-06 NOTE — Therapy (Signed)
[facility] [address]  [place], [place], [address] Phone: [phone]   Fax:  [phone]  Physical Therapy Treatment  Patient Details  Name: Andrew Banks MRN: [id number] Date of Birth: 11/23/1950 Referring Provider (PT): Rex Kras, [place] PA-C   Encounter Date: 04/06/2021   PT End of Session - 04/06/21 1412     Visit Number 4    Number of Visits 15    Date for PT Re-Evaluation 05/11/21    Authorization Type [facility]    Authorization - Visit Number 3    Authorization - Number of Visits 15    Progress Note Due on Visit 10    PT Start Time 5056    PT Stop Time 1445    PT Time Calculation (min) 42 min    Activity Tolerance Patient tolerated treatment well    Behavior During Therapy [facility] for tasks assessed/performed             Past Medical History:  Diagnosis Date   Arthritis    Degenerative disc disease, lumbar    Hypertension    Liver transplanted [facility])    Skin cancer     Past Surgical History:  Procedure Laterality Date   liver transplant      There were no vitals filed for this visit.   Subjective Assessment - 04/06/21 1409     Subjective Pt. reports last week was "cancer" week, he had a bunch of skin cancers removed.  Hip is exceptionally "loud" today.    Pertinent History s/p ORIF Pelvic fx 11/12/19;  s/p IMN L distal femur 08/12/2019;  s/p L IM nailing femur 11/02/20;  L foot dropskin CA, liver transplant, HTN, lumbar DDD    Diagnostic tests XR 12/22/20: 1.  Healing unstable traumatic U-shaped sacral fracture (spinopelvic dissociation) status post internal fixation with bilateral iliosacral screws at S1 and A left transsacral transiliac screw at S2. Both hips are located. No hardware complication. No gross change in alignment.   2.  Healed left ischiopubic ramus fracture.   3.  Healing peri-implant pertrochanteric left femoral fracture status post cephalomedullary nail fixation. No hardware is intact  without loosening. No change in fracture alignment.   4.  Healed bicondylar distal femoral fracture (extending proximally to the distal third diaphysis) status post ORIF with lateral buttress plate and screws. No hardware complication or change in fracture alignment.   5.  Medial patellar fracture is is not visible on this examination and seen on the CT left knee dated 11/11/2019.   6.  Healed nondisplaced fibular neck fracture.   7.  No new fractures.   8.  No joint misalignment (both sacroiliac, pubic symphysis, and both hips, and left knee).   9.  Status post embolization of multiple arteries (left L3 and L4 lumbar, right L4 lumbar, 2 branches of the left deep circumflex iliac artery, and bilateral iliolumbar arteries (per Interventional Radiology procedure note dated 11/10/2020) with multiple embolization coils in and around the anatomic pelvis. Vascular plug in the right groin.   10.  Status post ACL reconstruction. Femoral interference screw and partially imaged tibial screw are intact without loosening.   11.  Osteopenia.   12.  Moderate degenerative changes of the knee.   13.  Mild degenerative changes of the hips and pubic symphysis.   14.  Degenerative changes of the partially imaged lumbosacral spine.   15.  Partially imaged mesh tacks in the abdomen (along the ventral abdominal wall when correlating  with prior CT abdomen/pelvis dated 11/16/2019).    Patient Stated Goals improve LLE strength, improve L knee ROM, not fall again    Currently in Pain? Yes    Pain Score 6     Pain Location Hip    Pain Orientation Left                               [facility] Adult PT Treatment/Exercise - 04/06/21 0001       Knee/Hip Exercises: Aerobic   Other Aerobic x 5 min with 4WRW, noted some DOE      Knee/Hip Exercises: Seated   Abduction/Adduction  Right;10 reps    Abd/Adduction Limitations L leg stabilizing only due to pain with active hip add/flexion/abduction movements.    Sit to Sand  3 sets;10 reps;without UE support   squats with tap to 2 airex                Balance Exercises - 04/06/21 0001       Balance Exercises: Standing   Standing, One Foot on a Step Eyes open;Head turns;Limitations    Stepping Strategy Anterior;10 reps;Limitations    Stepping Strategy Limitations stepping foward and back over YTB, no AD when stepping foward with LLE, quad cane with stepping foward with RLE, CGA for safety throughout with 1 large LOB, modA to recover.                  PT Short Term Goals - 04/06/21 1733       PT SHORT TERM GOAL #1   Title Patient to be independent with initial HEP.    Time 2    Period Weeks    Status Achieved   04/06/21- reports good compliance and daily performance.   Target Date 03/30/21               PT Long Term Goals - 03/18/21 1637       PT LONG TERM GOAL #1   Title Patient to be independent with advanced HEP.    Time 8    Period Weeks    Status On-going    Target Date 05/11/21      PT LONG TERM GOAL #2   Title Patient will demonstrate improved endurance by completing 900' in 6MWT to access community.    Baseline 375' with 2 seated rest breaks needed, SPC used    Time 8    Period Weeks    Status On-going    Target Date 05/11/21      PT LONG TERM GOAL #3   Title Patient will score >19/24 on DGI to decrease risk of falls.    Baseline NT today    Time 8    Period Weeks    Status On-going    Target Date 05/11/21      PT LONG TERM GOAL #4   Title Pt. will report 50% improvement in LLE pain.    Time 8    Period Weeks    Status New    Target Date 05/11/21      PT LONG TERM GOAL #5   Title Patient to score atleast 45/56 on BERG in order to decrease risk of falls.    Time 8    Period Weeks    Status On-going    Target Date 05/11/21                   Plan - 04/06/21  61     Clinical Impression Statement Pt. reporting more L hip pain today, but was able to participate throughout session with  periodic rest breaks.  Very challenged with balance exercises, initially with large LOB requiring modA to recover balance but improved as trial progressed, although added cane to decrease pressure on L hip while stepping with R foot, due to increased LBP as well.  While able to place L foot on step, decreased to airex pad for when placing R foot up while in L foot stance, again due to pain and weakness. Pt. was ver challenged with balance exercises but felt they were helpful today.  He would benefit from continued skilled therapy.    Personal Factors and Comorbidities Comorbidity 3+    Comorbidities skin CA, liver transplant, HTN, lumbar DDD    Examination-Activity Limitations Bend;Carry;Locomotion Level;Squat;Transfers;Stand;Stairs    Examination-Participation Restrictions Yard Work;Cleaning;Laundry;Community Activity;Driving;Meal Prep;Shop    PT Frequency 2x / week    PT Duration 8 weeks    PT Treatment/Interventions ADLs/Self Care Home Management;Gait training;Stair training;Functional mobility training;Therapeutic activities;Therapeutic exercise;Balance training;Neuromuscular re-education;Patient/family education;Manual techniques;Iontophoresis '4mg'$ /ml Dexamethasone;Moist Heat;Cryotherapy;Passive range of motion;Dry needling;Joint Manipulations    PT Next Visit Plan LE strengthening and endurance, MT to left quads/ITB    Consulted and Agree with Plan of Care Patient             Patient will benefit from skilled therapeutic intervention in order to improve the following deficits and impairments:  Abnormal gait, Decreased activity tolerance, Decreased endurance, Decreased range of motion, Decreased strength, Impaired sensation, Pain, Decreased balance, Decreased mobility, Decreased safety awareness, Difficulty walking, Increased muscle spasms  Visit Diagnosis: Chronic pain of left knee  Muscle weakness (generalized)  Difficulty in walking, not elsewhere classified  Unsteadiness on  feet     Problem List There are no problems to display for this patient.   Rennie Natter, PT, DPT  04/06/2021, 5:34 PM  [facility] [address]  [place], [place], [address] Phone: [phone]   Fax:  [phone]  Name: Andrew Banks MRN: [id number] Date of Birth: 1950/06/11

## 2021-04-08 ENCOUNTER — Other Ambulatory Visit: Payer: Self-pay

## 2021-04-08 ENCOUNTER — Ambulatory Visit: Payer: No Typology Code available for payment source

## 2021-04-08 DIAGNOSIS — M25562 Pain in left knee: Secondary | ICD-10-CM | POA: Diagnosis not present

## 2021-04-08 DIAGNOSIS — R2681 Unsteadiness on feet: Secondary | ICD-10-CM

## 2021-04-08 DIAGNOSIS — M6281 Muscle weakness (generalized): Secondary | ICD-10-CM

## 2021-04-08 DIAGNOSIS — R262 Difficulty in walking, not elsewhere classified: Secondary | ICD-10-CM

## 2021-04-08 DIAGNOSIS — G8929 Other chronic pain: Secondary | ICD-10-CM

## 2021-04-08 NOTE — Therapy (Signed)
[facility] [address]  [place], [place], [address] Phone: [phone]   Fax:  [phone]  Physical Therapy Treatment  Patient Details  Name: Andrew Banks MRN: [id number] Date of Birth: 15-Apr-1950 Referring Provider (PT): Rex Kras, [place] PA-C   Encounter Date: 04/08/2021   PT End of Session - 04/08/21 1149     Visit Number 5    Number of Visits 15    Date for PT Re-Evaluation 05/11/21    Authorization Type [facility]    Authorization - Visit Number 4    Authorization - Number of Visits 15    Progress Note Due on Visit 10    PT Start Time 1106    PT Stop Time 1146    PT Time Calculation (min) 40 min    Activity Tolerance Patient tolerated treatment well    Behavior During Therapy [facility] for tasks assessed/performed             Past Medical History:  Diagnosis Date   Arthritis    Degenerative disc disease, lumbar    Hypertension    Liver transplanted [facility])    Skin cancer     Past Surgical History:  Procedure Laterality Date   liver transplant      There were no vitals filed for this visit.   Subjective Assessment - 04/08/21 1114     Subjective Pt reports not feeling the greatest today, feeling a little slow today.    Pertinent History s/p ORIF Pelvic fx 11/12/19;  s/p IMN L distal femur 08/12/2019;  s/p L IM nailing femur 11/02/20;  L foot dropskin CA, liver transplant, HTN, lumbar DDD    Diagnostic tests XR 12/22/20: 1.  Healing unstable traumatic U-shaped sacral fracture (spinopelvic dissociation) status post internal fixation with bilateral iliosacral screws at S1 and A left transsacral transiliac screw at S2. Both hips are located. No hardware complication. No gross change in alignment.   2.  Healed left ischiopubic ramus fracture.   3.  Healing peri-implant pertrochanteric left femoral fracture status post cephalomedullary nail fixation. No hardware is intact without loosening. No change in fracture  alignment.   4.  Healed bicondylar distal femoral fracture (extending proximally to the distal third diaphysis) status post ORIF with lateral buttress plate and screws. No hardware complication or change in fracture alignment.   5.  Medial patellar fracture is is not visible on this examination and seen on the CT left knee dated 11/11/2019.   6.  Healed nondisplaced fibular neck fracture.   7.  No new fractures.   8.  No joint misalignment (both sacroiliac, pubic symphysis, and both hips, and left knee).   9.  Status post embolization of multiple arteries (left L3 and L4 lumbar, right L4 lumbar, 2 branches of the left deep circumflex iliac artery, and bilateral iliolumbar arteries (per Interventional Radiology procedure note dated 11/10/2020) with multiple embolization coils in and around the anatomic pelvis. Vascular plug in the right groin.   10.  Status post ACL reconstruction. Femoral interference screw and partially imaged tibial screw are intact without loosening.   11.  Osteopenia.   12.  Moderate degenerative changes of the knee.   13.  Mild degenerative changes of the hips and pubic symphysis.   14.  Degenerative changes of the partially imaged lumbosacral spine.   15.  Partially imaged mesh tacks in the abdomen (along the ventral abdominal wall when correlating with prior CT abdomen/pelvis dated 11/16/2019).  Patient Stated Goals improve LLE strength, improve L knee ROM, not fall again    Currently in Pain? Yes    Pain Score 7     Pain Location --   L hip, lower back, sacram   Pain Orientation Left    Pain Descriptors / Indicators Aching                               [facility] Adult PT Treatment/Exercise - 04/08/21 0001       Ambulation/Gait   Ambulation/Gait Yes    Ambulation/Gait Assistance 6: Modified independent (Device/Increase time)    Ambulation Distance (Feet) 400 Feet    Assistive device Rolling walker    Gait Pattern Step-through pattern;Lateral trunk lean to  right;Wide base of support;Decreased step length - left;Decreased stance time - right    Gait Comments for warm up      Knee/Hip Exercises: Aerobic   Nustep L5x64mn      Knee/Hip Exercises: Machines for Strengthening   Cybex Knee Flexion 10lb B LE 2x10    Cybex Leg Press 5lb B LE 2x10      Knee/Hip Exercises: Standing   Functional Squat 10 reps    Functional Squat Limitations mini squats with 2 hand support      Knee/Hip Exercises: Seated   Clamshell with TheraBand Red   10x3"                Balance Exercises - 04/08/21 0001       Balance Exercises: Standing   Stepping Strategy Anterior;10 reps;Limitations    Stepping Strategy Limitations R/L fwd stepping with increased step length; cues to increase heel strike    Other Standing Exercises church pews x 10    Other Standing Exercises Comments R/L LE monster steps reaching to cones x 10                  PT Short Term Goals - 04/06/21 1733       PT SHORT TERM GOAL #1   Title Patient to be independent with initial HEP.    Time 2    Period Weeks    Status Achieved   04/06/21- reports good compliance and daily performance.   Target Date 03/30/21               PT Long Term Goals - 03/18/21 1637       PT LONG TERM GOAL #1   Title Patient to be independent with advanced HEP.    Time 8    Period Weeks    Status On-going    Target Date 05/11/21      PT LONG TERM GOAL #2   Title Patient will demonstrate improved endurance by completing 900' in 6MWT to access community.    Baseline 375' with 2 seated rest breaks needed, SPC used    Time 8    Period Weeks    Status On-going    Target Date 05/11/21      PT LONG TERM GOAL #3   Title Patient will score >19/24 on DGI to decrease risk of falls.    Baseline NT today    Time 8    Period Weeks    Status On-going    Target Date 05/11/21      PT LONG TERM GOAL #4   Title Pt. will report 50% improvement in LLE pain.    Time 8    Period Weeks  Status  New    Target Date 05/11/21      PT LONG TERM GOAL #5   Title Patient to score atleast 45/56 on BERG in order to decrease risk of falls.    Time 8    Period Weeks    Status On-going    Target Date 05/11/21                   Plan - 04/08/21 1150     Clinical Impression Statement Pt responded well to the treatment today. Continued working on stepping strategy, instruction needed to increase step length and heel strike mostly with the R LE. He needed cues to avoid leaning his trunk fwd when standing completing exercises. He continues with antalgic gait with decreased stamce time on the R LE and the standing exercises were too restore normal step length. Pt did well with the seated TE, only noting some muscle fatigue afterwards.    Personal Factors and Comorbidities Comorbidity 3+    Comorbidities skin CA, liver transplant, HTN, lumbar DDD    PT Frequency 2x / week    PT Duration 8 weeks    PT Treatment/Interventions ADLs/Self Care Home Management;Gait training;Stair training;Functional mobility training;Therapeutic activities;Therapeutic exercise;Balance training;Neuromuscular re-education;Patient/family education;Manual techniques;Iontophoresis '4mg'$ /ml Dexamethasone;Moist Heat;Cryotherapy;Passive range of motion;Dry needling;Joint Manipulations    PT Next Visit Plan LE strengthening and endurance, MT to left quads/ITB    Consulted and Agree with Plan of Care Patient             Patient will benefit from skilled therapeutic intervention in order to improve the following deficits and impairments:  Abnormal gait, Decreased activity tolerance, Decreased endurance, Decreased range of motion, Decreased strength, Impaired sensation, Pain, Decreased balance, Decreased mobility, Decreased safety awareness, Difficulty walking, Increased muscle spasms  Visit Diagnosis: Chronic pain of left knee  Muscle weakness (generalized)  Difficulty in walking, not elsewhere  classified  Unsteadiness on feet     Problem List There are no problems to display for this patient.   Artist Pais, PTA 04/08/2021, 11:58 AM  [facility] [address]  [place], [place], [address] Phone: [phone]   Fax:  [phone]  Name: Andrew Banks MRN: [id number] Date of Birth: Mar 20, 1950

## 2021-04-12 ENCOUNTER — Ambulatory Visit: Payer: No Typology Code available for payment source

## 2021-04-12 ENCOUNTER — Other Ambulatory Visit: Payer: Self-pay

## 2021-04-12 DIAGNOSIS — M6281 Muscle weakness (generalized): Secondary | ICD-10-CM

## 2021-04-12 DIAGNOSIS — M25562 Pain in left knee: Secondary | ICD-10-CM

## 2021-04-12 DIAGNOSIS — R262 Difficulty in walking, not elsewhere classified: Secondary | ICD-10-CM

## 2021-04-12 DIAGNOSIS — R2681 Unsteadiness on feet: Secondary | ICD-10-CM

## 2021-04-12 DIAGNOSIS — G8929 Other chronic pain: Secondary | ICD-10-CM

## 2021-04-12 NOTE — Therapy (Signed)
[facility] ?[place] ?[place], [place], [address] ?Phone: [phone]   Fax:  [phone] ? ?Physical Therapy Treatment ? ?Patient Details  ?Name: Andrew Banks ?MRN: [id number] ?Date of Birth: 05-24-1950 ?Referring Provider (PT): Little, Traci PA-C ? ? ?Encounter Date: 04/12/2021 ? ? PT End of Session - 04/12/21 1447   ? ? Visit Number 6   ? Number of Visits 15   ? Date for PT Re-Evaluation 05/11/21   ? Authorization Type [facility]   ? Authorization - Visit Number 5   ? Authorization - Number of Visits 15   ? Progress Note Due on Visit 10   ? PT Start Time 1401   ? PT Stop Time 4765   ? PT Time Calculation (min) 41 min   ? Activity Tolerance Patient tolerated treatment well   ? Behavior During Therapy [facility] for tasks assessed/performed   ? ?  ?  ? ?  ? ? ?Past Medical History:  ?Diagnosis Date  ? Arthritis   ? Degenerative disc disease, lumbar   ? Hypertension   ? Liver transplanted [facility])   ? Skin cancer   ? ? ?Past Surgical History:  ?Procedure Laterality Date  ? liver transplant    ? ? ?There were no vitals filed for this visit. ? ? Subjective Assessment - 04/12/21 1403   ? ? Subjective Pt reports pain in L leg again.   ? Pertinent History s/p ORIF Pelvic fx 11/12/19;  s/p IMN L distal femur 08/12/2019;  s/p L IM nailing femur 11/02/20;  L foot dropskin CA, liver transplant, HTN, lumbar DDD   ? Diagnostic tests XR 12/22/20: 1.  Healing unstable traumatic U-shaped sacral fracture (spinopelvic dissociation) status post internal fixation with bilateral iliosacral screws at S1 and A left transsacral transiliac screw at S2. Both hips are located. No hardware complication. No gross change in alignment.   2.  Healed left ischiopubic ramus fracture.   3.  Healing peri-implant pertrochanteric left femoral fracture status post cephalomedullary nail fixation. No hardware is intact without loosening. No change in fracture alignment.   4.  Healed bicondylar distal  femoral fracture (extending proximally to the distal third diaphysis) status post ORIF with lateral buttress plate and screws. No hardware complication or change in fracture alignment.   5.  Medial patellar fracture is is not visible on this examination and seen on the CT left knee dated 11/11/2019.   6.  Healed nondisplaced fibular neck fracture.   7.  No new fractures.   8.  No joint misalignment (both sacroiliac, pubic symphysis, and both hips, and left knee).   9.  Status post embolization of multiple arteries (left L3 and L4 lumbar, right L4 lumbar, 2 branches of the left deep circumflex iliac artery, and bilateral iliolumbar arteries (per Interventional Radiology procedure note dated 11/10/2020) with multiple embolization coils in and around the anatomic pelvis. Vascular plug in the right groin.   10.  Status post ACL reconstruction. Femoral interference screw and partially imaged tibial screw are intact without loosening.   11.  Osteopenia.   12.  Moderate degenerative changes of the knee.   13.  Mild degenerative changes of the hips and pubic symphysis.   14.  Degenerative changes of the partially imaged lumbosacral spine.   15.  Partially imaged mesh tacks in the abdomen (along the ventral abdominal wall when correlating with prior CT abdomen/pelvis dated 11/16/2019).   ? Patient Stated Goals improve LLE  strength, improve L knee ROM, not fall again   ? Currently in Pain? Yes   ? Pain Score 6    ? Pain Location Sacrum   radiating to L hip flexors and ITB  ? Pain Orientation Left   ? ?  ?  ? ?  ? ? ? ? ? ? ? ? ? ? ? ? ? ? ? ? ? ? ? ? [facility] Adult PT Treatment/Exercise - 04/12/21 0001   ? ?  ? Knee/Hip Exercises: Aerobic  ? Nustep L5x46mn   ?  ? Knee/Hip Exercises: Machines for Strengthening  ? Cybex Knee Extension 15lb BLE 2x10  ?5lb LLE x 15  ?  ? Knee/Hip Exercises: Standing  ? Functional Squat 10 reps   ? Functional Squat Limitations mini squats with 2 hand support   ?  ? Knee/Hip Exercises: Seated  ?  Clamshell with TheraBand Red   15x3"  ? Abduction/Adduction  Strengthening;Both;10 reps   ? Abd/Adduction Limitations unilateral, red TB,   ? ?  ?  ? ?  ? ? ? ? ? ? Balance Exercises - 04/12/21 0001   ? ?  ? Balance Exercises: Standing  ? Tandem Stance Eyes open;Upper extremity support 2;3 reps   10 secs at a time, CGA  ? Stepping Strategy Anterior;10 reps;Limitations   ? Stepping Strategy Limitations R/L fwd stepping with increased step length; cues to increase heel strike   ? Rockerboard --   ? Retro Gait Upper extremity support;4 reps   along the counter (15 ft) fatigue after 2x  ? Sidestepping Upper extremity support;4 reps   along counter (167f  ? ?  ?  ? ?  ? ? ? ? ? ? ? PT Short Term Goals - 04/06/21 1733   ? ?  ? PT SHORT TERM GOAL #1  ? Title Patient to be independent with initial HEP.   ? Time 2   ? Period Weeks   ? Status Achieved   04/06/21- reports good compliance and daily performance.  ? Target Date 03/30/21   ? ?  ?  ? ?  ? ? ? ? PT Long Term Goals - 03/18/21 1637   ? ?  ? PT LONG TERM GOAL #1  ? Title Patient to be independent with advanced HEP.   ? Time 8   ? Period Weeks   ? Status On-going   ? Target Date 05/11/21   ?  ? PT LONG TERM GOAL #2  ? Title Patient will demonstrate improved endurance by completing 900' in 6MWT to access community.   ? Baseline 375' with 2 seated rest breaks needed, SPC used   ? Time 8   ? Period Weeks   ? Status On-going   ? Target Date 05/11/21   ?  ? PT LONG TERM GOAL #3  ? Title Patient will score >19/24 on DGI to decrease risk of falls.   ? Baseline NT today   ? Time 8   ? Period Weeks   ? Status On-going   ? Target Date 05/11/21   ?  ? PT LONG TERM GOAL #4  ? Title Pt. will report 50% improvement in LLE pain.   ? Time 8   ? Period Weeks   ? Status New   ? Target Date 05/11/21   ?  ? PT LONG TERM GOAL #5  ? Title Patient to score atleast 45/56 on BERG in order to decrease risk of falls.   ? Time  8   ? Period Weeks   ? Status On-going   ? Target Date 05/11/21   ? ?  ?   ? ?  ? ? ? ? ? ? ? ? Plan - 04/12/21 1511   ? ? Clinical Impression Statement Pt continues to show more difficulty with dynamic balance activities like sidesteps and retro gait. At times he requires cues to avoid pushing too hard with exercises. CGA given and therapist supervision required for safety and technique with exercises. He is showing more strength being able to do more resistance on knee ext machine. Pt shows to be making progress.   ? Personal Factors and Comorbidities Comorbidity 3+   ? Comorbidities skin CA, liver transplant, HTN, lumbar DDD   ? PT Frequency 2x / week   ? PT Duration 8 weeks   ? PT Treatment/Interventions ADLs/Self Care Home Management;Gait training;Stair training;Functional mobility training;Therapeutic activities;Therapeutic exercise;Balance training;Neuromuscular re-education;Patient/family education;Manual techniques;Iontophoresis '4mg'$ /ml Dexamethasone;Moist Heat;Cryotherapy;Passive range of motion;Dry needling;Joint Manipulations   ? PT Next Visit Plan LE strengthening and endurance, MT to left quads/ITB   ? Consulted and Agree with Plan of Care Patient   ? ?  ?  ? ?  ? ? ?Patient will benefit from skilled therapeutic intervention in order to improve the following deficits and impairments:  Abnormal gait, Decreased activity tolerance, Decreased endurance, Decreased range of motion, Decreased strength, Impaired sensation, Pain, Decreased balance, Decreased mobility, Decreased safety awareness, Difficulty walking, Increased muscle spasms ? ?Visit Diagnosis: ?Chronic pain of left knee ? ?Muscle weakness (generalized) ? ?Difficulty in walking, not elsewhere classified ? ?Unsteadiness on feet ? ? ? ? ?Problem List ?There are no problems to display for this patient. ? ? ?Artist Pais, PTA ?04/12/2021, 3:33 PM ? ?[facility] ?[place] ?[place], [place], [address] ?Phone: [phone]   Fax:  [phone] ? ?Name:  Andrew Banks ?MRN: [id number] ?Date of Birth: [birth year] ? ? ? ?

## 2021-04-19 ENCOUNTER — Ambulatory Visit: Payer: No Typology Code available for payment source | Admitting: Physical Therapy

## 2021-04-22 ENCOUNTER — Ambulatory Visit: Payer: No Typology Code available for payment source

## 2021-04-22 ENCOUNTER — Other Ambulatory Visit: Payer: Self-pay

## 2021-04-22 DIAGNOSIS — R262 Difficulty in walking, not elsewhere classified: Secondary | ICD-10-CM

## 2021-04-22 DIAGNOSIS — R2681 Unsteadiness on feet: Secondary | ICD-10-CM

## 2021-04-22 DIAGNOSIS — R2689 Other abnormalities of gait and mobility: Secondary | ICD-10-CM

## 2021-04-22 DIAGNOSIS — M6281 Muscle weakness (generalized): Secondary | ICD-10-CM

## 2021-04-22 DIAGNOSIS — G8929 Other chronic pain: Secondary | ICD-10-CM

## 2021-04-22 DIAGNOSIS — M25562 Pain in left knee: Secondary | ICD-10-CM | POA: Diagnosis not present

## 2021-04-22 NOTE — Therapy (Signed)
[facility] ?[place] ?[place], [place], [address] ?Phone: [phone]   Fax:  [phone] ? ?Physical Therapy Treatment ? ?Patient Details  ?Name: Andrew Banks ?MRN: [id number] ?Date of Birth: [birth year] ?Referring Provider (PT): Little, Traci PA-C ? ? ?Encounter Date: 04/22/2021 ? ? PT End of Session - 04/22/21 1451   ? ? Visit Number 7   ? Number of Visits 15   ? Date for PT Re-Evaluation 05/11/21   ? Authorization Type [facility]   ? Authorization - Visit Number 5   ? Authorization - Number of Visits 15   ? Progress Note Due on Visit 10   ? PT Start Time 1358   ? PT Stop Time 2878   ? PT Time Calculation (min) 45 min   ? Activity Tolerance Patient tolerated treatment well   ? Behavior During Therapy [facility] for tasks assessed/performed   ? ?  ?  ? ?  ? ? ?Past Medical History:  ?Diagnosis Date  ? Arthritis   ? Degenerative disc disease, lumbar   ? Hypertension   ? Liver transplanted [facility])   ? Skin cancer   ? ? ?Past Surgical History:  ?Procedure Laterality Date  ? liver transplant    ? ? ?There were no vitals filed for this visit. ? ? Subjective Assessment - 04/22/21 1401   ? ? Subjective Pt reports feeling better today   ? Pertinent History s/p ORIF Pelvic fx 11/12/19;  s/p IMN L distal femur 08/12/2019;  s/p L IM nailing femur 11/02/20;  L foot dropskin CA, liver transplant, HTN, lumbar DDD   ? Diagnostic tests XR 12/22/20: 1.  Healing unstable traumatic U-shaped sacral fracture (spinopelvic dissociation) status post internal fixation with bilateral iliosacral screws at S1 and A left transsacral transiliac screw at S2. Both hips are located. No hardware complication. No gross change in alignment.   2.  Healed left ischiopubic ramus fracture.   3.  Healing peri-implant pertrochanteric left femoral fracture status post cephalomedullary nail fixation. No hardware is intact without loosening. No change in fracture alignment.   4.  Healed bicondylar distal  femoral fracture (extending proximally to the distal third diaphysis) status post ORIF with lateral buttress plate and screws. No hardware complication or change in fracture alignment.   5.  Medial patellar fracture is is not visible on this examination and seen on the CT left knee dated 11/11/2019.   6.  Healed nondisplaced fibular neck fracture.   7.  No new fractures.   8.  No joint misalignment (both sacroiliac, pubic symphysis, and both hips, and left knee).   9.  Status post embolization of multiple arteries (left L3 and L4 lumbar, right L4 lumbar, 2 branches of the left deep circumflex iliac artery, and bilateral iliolumbar arteries (per Interventional Radiology procedure note dated 11/10/2020) with multiple embolization coils in and around the anatomic pelvis. Vascular plug in the right groin.   10.  Status post ACL reconstruction. Femoral interference screw and partially imaged tibial screw are intact without loosening.   11.  Osteopenia.   12.  Moderate degenerative changes of the knee.   13.  Mild degenerative changes of the hips and pubic symphysis.   14.  Degenerative changes of the partially imaged lumbosacral spine.   15.  Partially imaged mesh tacks in the abdomen (along the ventral abdominal wall when correlating with prior CT abdomen/pelvis dated 11/16/2019).   ? Patient Stated Goals improve LLE strength, improve  L knee ROM, not fall again   ? Currently in Pain? Yes   ? Pain Score 6    ? Pain Location Hip   ? Pain Orientation Left   ? Pain Descriptors / Indicators Aching   ? Pain Type Acute pain   ? ?  ?  ? ?  ? ? ? ? ? [facility] PT Assessment - 04/22/21 0001   ? ?  ? Standardized Balance Assessment  ? Standardized Balance Assessment Dynamic Gait Index   ?  ? Dynamic Gait Index  ? Level Surface Mild Impairment   ? Change in Gait Speed Mild Impairment   ? Gait with Horizontal Head Turns Mild Impairment   ? Gait with Vertical Head Turns Mild Impairment   ? Gait and Pivot Turn Mild Impairment   ? Step Over  Obstacle Moderate Impairment   ? Step Around Obstacles Normal   ? Steps Moderate Impairment   ? Total Score 15   ?  ? Functional Gait  Assessment  ? Gait assessed  Yes   ? ?  ?  ? ?  ? ? ? ? ? ? ? ? ? ? ? ? ? ? ? ? [place] Adult PT Treatment/Exercise - 04/22/21 0001   ? ?  ? Ambulation/Gait  ? Ambulation/Gait Yes   ? Ambulation/Gait Assistance 6: Modified independent (Device/Increase time)   ? Ambulation Distance (Feet) 510 Feet   ? Assistive device Straight cane   ? Gait Pattern Step-through pattern;Lateral trunk lean to right;Wide base of support;Decreased step length - left;Decreased stance time - right   ?  ? Knee/Hip Exercises: Aerobic  ? Nustep L5x29mn   ?  ? Knee/Hip Exercises: Machines for Strengthening  ? Cybex Knee Extension 20lb BLE 2x10   ? Cybex Knee Flexion 20lb BLE 2x10   ? ?  ?  ? ?  ? ? ? ? ? ? Balance Exercises - 04/22/21 0001   ? ?  ? Balance Exercises: Standing  ? Stepping Strategy Anterior;Lateral;10 reps;UE support   ? Stepping Strategy Limitations R/L fwd and lateral stepping over 1/2 foam roll x 10 each; over rolled  yoga mat x 10 each   ? ?  ?  ? ?  ? ? ? ? ? ? ? PT Short Term Goals - 04/06/21 1733   ? ?  ? PT SHORT TERM GOAL #1  ? Title Patient to be independent with initial HEP.   ? Time 2   ? Period Weeks   ? Status Achieved   04/06/21- reports good compliance and daily performance.  ? Target Date 03/30/21   ? ?  ?  ? ?  ? ? ? ? PT Long Term Goals - 03/18/21 1637   ? ?  ? PT LONG TERM GOAL #1  ? Title Patient to be independent with advanced HEP.   ? Time 8   ? Period Weeks   ? Status On-going   ? Target Date 05/11/21   ?  ? PT LONG TERM GOAL #2  ? Title Patient will demonstrate improved endurance by completing 900' in 6MWT to access community.   ? Baseline 375' with 2 seated rest breaks needed, SPC used   ? Time 8   ? Period Weeks   ? Status On-going   ? Target Date 05/11/21   ?  ? PT LONG TERM GOAL #3  ? Title Patient will score >19/24 on DGI to decrease risk of falls.   ? Baseline NT today    ?  Time 8   ? Period Weeks   ? Status On-going   ? Target Date 05/11/21   ?  ? PT LONG TERM GOAL #4  ? Title Pt. will report 50% improvement in LLE pain.   ? Time 8   ? Period Weeks   ? Status New   ? Target Date 05/11/21   ?  ? PT LONG TERM GOAL #5  ? Title Patient to score atleast 45/56 on BERG in order to decrease risk of falls.   ? Time 8   ? Period Weeks   ? Status On-going   ? Target Date 05/11/21   ? ?  ?  ? ?  ? ? ? ? ? ? ? ? Plan - 04/22/21 1453   ? ? Clinical Impression Statement Pt scored 15/24 on the DGI. Progressed stepping exercises from 1/2 foam roll to rolled up yoga mat. Close supervision needed and instruction for proper ant and lateral WS with stepping exercises. Cues required for upright posture and to fully activate LE muscles for postural stability.   ? Personal Factors and Comorbidities Comorbidity 3+   ? Comorbidities skin CA, liver transplant, HTN, lumbar DDD   ? PT Frequency 2x / week   ? PT Duration 8 weeks   ? PT Treatment/Interventions ADLs/Self Care Home Management;Gait training;Stair training;Functional mobility training;Therapeutic activities;Therapeutic exercise;Balance training;Neuromuscular re-education;Patient/family education;Manual techniques;Iontophoresis '4mg'$ /ml Dexamethasone;Moist Heat;Cryotherapy;Passive range of motion;Dry needling;Joint Manipulations   ? PT Next Visit Plan LE strengthening and endurance, MT to left quads/ITB   ? Consulted and Agree with Plan of Care Patient   ? ?  ?  ? ?  ? ? ?Patient will benefit from skilled therapeutic intervention in order to improve the following deficits and impairments:  Abnormal gait, Decreased activity tolerance, Decreased endurance, Decreased range of motion, Decreased strength, Impaired sensation, Pain, Decreased balance, Decreased mobility, Decreased safety awareness, Difficulty walking, Increased muscle spasms ? ?Visit Diagnosis: ?Chronic pain of left knee ? ?Muscle weakness (generalized) ? ?Difficulty in walking, not elsewhere  classified ? ?Unsteadiness on feet ? ?Chronic left-sided low back pain with left-sided sciatica ? ?Other abnormalities of gait and mobility ? ? ? ? ?Problem List ?There are no problems to display for th

## 2021-04-26 ENCOUNTER — Other Ambulatory Visit: Payer: Self-pay

## 2021-04-26 ENCOUNTER — Ambulatory Visit: Payer: No Typology Code available for payment source

## 2021-04-26 DIAGNOSIS — G8929 Other chronic pain: Secondary | ICD-10-CM

## 2021-04-26 DIAGNOSIS — R2689 Other abnormalities of gait and mobility: Secondary | ICD-10-CM

## 2021-04-26 DIAGNOSIS — R2681 Unsteadiness on feet: Secondary | ICD-10-CM

## 2021-04-26 DIAGNOSIS — M6281 Muscle weakness (generalized): Secondary | ICD-10-CM

## 2021-04-26 DIAGNOSIS — M25562 Pain in left knee: Secondary | ICD-10-CM | POA: Diagnosis not present

## 2021-04-26 DIAGNOSIS — R262 Difficulty in walking, not elsewhere classified: Secondary | ICD-10-CM

## 2021-04-26 NOTE — Therapy (Signed)
[place] ?[facility] ?[place] ?[place], [place], [address] ?Phone: [phone]   Fax:  [phone] ? ?Physical Therapy Treatment ? ?Patient Details  ?Name: Andrew Banks ?MRN: [id number] ?Date of Birth: 1950-08-06 ?Referring Provider (PT): Little, Traci PA-C ? ? ?Encounter Date: 04/26/2021 ? ? PT End of Session - 04/26/21 1447   ? ? Visit Number 8   ? Number of Visits 15   ? Date for PT Re-Evaluation 05/11/21   ? Authorization Type [facility]   ? Authorization - Visit Number 6   ? Authorization - Number of Visits 15   ? Progress Note Due on Visit 10   ? PT Start Time 1400   ? PT Stop Time 0086   ? PT Time Calculation (min) 42 min   ? Activity Tolerance Patient tolerated treatment well   ? Behavior During Therapy [facility] for tasks assessed/performed   ? ?  ?  ? ?  ? ? ?Past Medical History:  ?Diagnosis Date  ? Arthritis   ? Degenerative disc disease, lumbar   ? Hypertension   ? Liver transplanted [facility])   ? Skin cancer   ? ? ?Past Surgical History:  ?Procedure Laterality Date  ? liver transplant    ? ? ?There were no vitals filed for this visit. ? ? Subjective Assessment - 04/26/21 1401   ? ? Subjective "We are going to have to take it easy today, I did a lot of stuff on my feet this weekend and my whole left side is screaming at me."   ? Pertinent History s/p ORIF Pelvic fx 11/12/19;  s/p IMN L distal femur 08/12/2019;  s/p L IM nailing femur 11/02/20;  L foot dropskin CA, liver transplant, HTN, lumbar DDD   ? Diagnostic tests XR 12/22/20: 1.  Healing unstable traumatic U-shaped sacral fracture (spinopelvic dissociation) status post internal fixation with bilateral iliosacral screws at S1 and A left transsacral transiliac screw at S2. Both hips are located. No hardware complication. No gross change in alignment.   2.  Healed left ischiopubic ramus fracture.   3.  Healing peri-implant pertrochanteric left femoral fracture status post cephalomedullary nail fixation. No  hardware is intact without loosening. No change in fracture alignment.   4.  Healed bicondylar distal femoral fracture (extending proximally to the distal third diaphysis) status post ORIF with lateral buttress plate and screws. No hardware complication or change in fracture alignment.   5.  Medial patellar fracture is is not visible on this examination and seen on the CT left knee dated 11/11/2019.   6.  Healed nondisplaced fibular neck fracture.   7.  No new fractures.   8.  No joint misalignment (both sacroiliac, pubic symphysis, and both hips, and left knee).   9.  Status post embolization of multiple arteries (left L3 and L4 lumbar, right L4 lumbar, 2 branches of the left deep circumflex iliac artery, and bilateral iliolumbar arteries (per Interventional Radiology procedure note dated 11/10/2020) with multiple embolization coils in and around the anatomic pelvis. Vascular plug in the right groin.   10.  Status post ACL reconstruction. Femoral interference screw and partially imaged tibial screw are intact without loosening.   11.  Osteopenia.   12.  Moderate degenerative changes of the knee.   13.  Mild degenerative changes of the hips and pubic symphysis.   14.  Degenerative changes of the partially imaged lumbosacral spine.   15.  Partially imaged mesh tacks in  the abdomen (along the ventral abdominal wall when correlating with prior CT abdomen/pelvis dated 11/16/2019).   ? Patient Stated Goals improve LLE strength, improve L knee ROM, not fall again   ? Currently in Pain? Yes   ? Pain Score 7    ? Pain Location Knee   ? Pain Orientation Left   down to ankle  ? Pain Descriptors / Indicators Aching   ? Pain Type Acute pain   ? Pain Score 8   ? Pain Location Hip   ? Pain Orientation Left   ? ?  ?  ? ?  ? ? ? ? ? ? ? ? ? ? ? ? ? ? ? ? ? ? ? ? [place] Adult PT Treatment/Exercise - 04/26/21 0001   ? ?  ? Ambulation/Gait  ? Ambulation/Gait Yes   ? Ambulation/Gait Assistance 6: Modified independent (Device/Increase  time)   ? Ambulation Distance (Feet) 240 Feet   ? Assistive device Straight cane   ? Gait Pattern Step-through pattern;Lateral trunk lean to right;Wide base of support;Decreased step length - left;Decreased stance time - right   ?  ? Knee/Hip Exercises: Aerobic  ? Nustep L6x49mn   ?  ? Knee/Hip Exercises: Seated  ? Ball Squeeze 10x5 sec hold;   ? Marching Strengthening;Both;10 reps;Weights;2 sets   ? Marching Weights 2 lbs.   ?  ? Knee/Hip Exercises: Supine  ? Bridges Strengthening;Both;10 reps;2 sets   ? Straight Leg Raises Strengthening;Both;10 reps   ? ?  ?  ? ?  ? ? ? ? ? ? ? ? ? ? ? ? PT Short Term Goals - 04/06/21 1733   ? ?  ? PT SHORT TERM GOAL #1  ? Title Patient to be independent with initial HEP.   ? Time 2   ? Period Weeks   ? Status Achieved   04/06/21- reports good compliance and daily performance.  ? Target Date 03/30/21   ? ?  ?  ? ?  ? ? ? ? PT Long Term Goals - 03/18/21 1637   ? ?  ? PT LONG TERM GOAL #1  ? Title Patient to be independent with advanced HEP.   ? Time 8   ? Period Weeks   ? Status On-going   ? Target Date 05/11/21   ?  ? PT LONG TERM GOAL #2  ? Title Patient will demonstrate improved endurance by completing 900' in 6MWT to access community.   ? Baseline 375' with 2 seated rest breaks needed, SPC used   ? Time 8   ? Period Weeks   ? Status On-going   ? Target Date 05/11/21   ?  ? PT LONG TERM GOAL #3  ? Title Patient will score >19/24 on DGI to decrease risk of falls.   ? Baseline NT today   ? Time 8   ? Period Weeks   ? Status On-going   ? Target Date 05/11/21   ?  ? PT LONG TERM GOAL #4  ? Title Pt. will report 50% improvement in LLE pain.   ? Time 8   ? Period Weeks   ? Status New   ? Target Date 05/11/21   ?  ? PT LONG TERM GOAL #5  ? Title Patient to score atleast 45/56 on BERG in order to decrease risk of falls.   ? Time 8   ? Period Weeks   ? Status On-going   ? Target Date 05/11/21   ? ?  ?  ? ?  ? ? ? ? ? ? ? ?  Plan - 04/26/21 1448   ? ? Clinical Impression Statement Pt  reported some increased pain from an increase in activity over the weekend. With seated TE, pt was instructed to avoid UE to activate more abdominals and core muscles for stabilization. Cues were needed for ecc control with seated marches and to avoid holding breath with ball squeezes. Pt mildly limited during gait by LE fatigue but cont'd practicing gait with SPC to allow for ambulation with a LRAD.   ? Personal Factors and Comorbidities Comorbidity 3+   ? Comorbidities skin CA, liver transplant, HTN, lumbar DDD   ? PT Frequency 2x / week   ? PT Duration 8 weeks   ? PT Treatment/Interventions ADLs/Self Care Home Management;Gait training;Stair training;Functional mobility training;Therapeutic activities;Therapeutic exercise;Balance training;Neuromuscular re-education;Patient/family education;Manual techniques;Iontophoresis '4mg'$ /ml Dexamethasone;Moist Heat;Cryotherapy;Passive range of motion;Dry needling;Joint Manipulations   ? PT Next Visit Plan LE strengthening and endurance, MT to left quads/ITB   ? Consulted and Agree with Plan of Care Patient   ? ?  ?  ? ?  ? ? ?Patient will benefit from skilled therapeutic intervention in order to improve the following deficits and impairments:  Abnormal gait, Decreased activity tolerance, Decreased endurance, Decreased range of motion, Decreased strength, Impaired sensation, Pain, Decreased balance, Decreased mobility, Decreased safety awareness, Difficulty walking, Increased muscle spasms ? ?Visit Diagnosis: ?Chronic pain of left knee ? ?Muscle weakness (generalized) ? ?Difficulty in walking, not elsewhere classified ? ?Unsteadiness on feet ? ?Chronic left-sided low back pain with left-sided sciatica ? ?Other abnormalities of gait and mobility ? ? ? ? ?Problem List ?There are no problems to display for this patient. ? ? ?Artist Pais, PTA ?04/26/2021, 4:37 PM ? ?[place] ?[facility] ?[place] ?[place], [place],  [address] ?Phone: [phone]   Fax:  [phone] ? ?Name: Andrew Banks ?MRN: [id number] ?Date of Birth: [birth year] ? ? ? ?

## 2021-04-29 ENCOUNTER — Ambulatory Visit: Payer: No Typology Code available for payment source

## 2021-04-29 DIAGNOSIS — M6281 Muscle weakness (generalized): Secondary | ICD-10-CM

## 2021-04-29 DIAGNOSIS — R2689 Other abnormalities of gait and mobility: Secondary | ICD-10-CM

## 2021-04-29 DIAGNOSIS — M25562 Pain in left knee: Secondary | ICD-10-CM | POA: Diagnosis not present

## 2021-04-29 DIAGNOSIS — G8929 Other chronic pain: Secondary | ICD-10-CM

## 2021-04-29 DIAGNOSIS — R262 Difficulty in walking, not elsewhere classified: Secondary | ICD-10-CM

## 2021-04-29 DIAGNOSIS — R2681 Unsteadiness on feet: Secondary | ICD-10-CM

## 2021-04-29 NOTE — Therapy (Signed)
[place] ?[facility] ?[place] ?[place], [place], [address] ?Phone: [phone]   Fax:  [phone] ? ?Physical Therapy Treatment ? ?Patient Details  ?Name: Andrew Banks ?MRN: [id number] ?Date of Birth: July 27, 1950 ?Referring Provider (PT): Little, Traci PA-C ? ? ?Encounter Date: 04/29/2021 ? ? PT End of Session - 04/29/21 1403   ? ? Visit Number 9   ? Number of Visits 15   ? Date for PT Re-Evaluation 05/11/21   ? Authorization Type [facility]   ? Authorization - Visit Number 7   ? Authorization - Number of Visits 15   ? Progress Note Due on Visit 10   ? PT Start Time 1318   ? PT Stop Time 1400   ? PT Time Calculation (min) 42 min   ? Activity Tolerance Patient tolerated treatment well   ? Behavior During Therapy [facility] for tasks assessed/performed   ? ?  ?  ? ?  ? ? ?Past Medical History:  ?Diagnosis Date  ? Arthritis   ? Degenerative disc disease, lumbar   ? Hypertension   ? Liver transplanted [facility])   ? Skin cancer   ? ? ?Past Surgical History:  ?Procedure Laterality Date  ? liver transplant    ? ? ?There were no vitals filed for this visit. ? ? Subjective Assessment - 04/29/21 1321   ? ? Subjective Stiff today with some pain.   ? Pertinent History s/p ORIF Pelvic fx 11/12/19;  s/p IMN L distal femur 08/12/2019;  s/p L IM nailing femur 11/02/20;  L foot dropskin CA, liver transplant, HTN, lumbar DDD   ? Diagnostic tests XR 12/22/20: 1.  Healing unstable traumatic U-shaped sacral fracture (spinopelvic dissociation) status post internal fixation with bilateral iliosacral screws at S1 and A left transsacral transiliac screw at S2. Both hips are located. No hardware complication. No gross change in alignment.   2.  Healed left ischiopubic ramus fracture.   3.  Healing peri-implant pertrochanteric left femoral fracture status post cephalomedullary nail fixation. No hardware is intact without loosening. No change in fracture alignment.   4.  Healed bicondylar distal femoral  fracture (extending proximally to the distal third diaphysis) status post ORIF with lateral buttress plate and screws. No hardware complication or change in fracture alignment.   5.  Medial patellar fracture is is not visible on this examination and seen on the CT left knee dated 11/11/2019.   6.  Healed nondisplaced fibular neck fracture.   7.  No new fractures.   8.  No joint misalignment (both sacroiliac, pubic symphysis, and both hips, and left knee).   9.  Status post embolization of multiple arteries (left L3 and L4 lumbar, right L4 lumbar, 2 branches of the left deep circumflex iliac artery, and bilateral iliolumbar arteries (per Interventional Radiology procedure note dated 11/10/2020) with multiple embolization coils in and around the anatomic pelvis. Vascular plug in the right groin.   10.  Status post ACL reconstruction. Femoral interference screw and partially imaged tibial screw are intact without loosening.   11.  Osteopenia.   12.  Moderate degenerative changes of the knee.   13.  Mild degenerative changes of the hips and pubic symphysis.   14.  Degenerative changes of the partially imaged lumbosacral spine.   15.  Partially imaged mesh tacks in the abdomen (along the ventral abdominal wall when correlating with prior CT abdomen/pelvis dated 11/16/2019).   ? Patient Stated Goals improve LLE strength, improve  L knee ROM, not fall again   ? Currently in Pain? Yes   ? Pain Score 6    ? Pain Location Knee   ? Pain Orientation Left   ? Pain Descriptors / Indicators Aching   ? ?  ?  ? ?  ? ? ? ? ? ? ? ? ? ? ? ? ? ? ? ? ? ? ? ? [place] Adult PT Treatment/Exercise - 04/29/21 0001   ? ?  ? High Level Balance  ? High Level Balance Activities Negotiating over obstacles   ? High Level Balance Comments over 3 rolled up yoga mats along the counter top fwd and lat x 5 times each   ?  ? Knee/Hip Exercises: Aerobic  ? Nustep L6x72mn   ?  ? Knee/Hip Exercises: Standing  ? Hip Flexion Stengthening;Both;10 reps;Knee  straight;2 sets   1 with leg straight; 1 set with knee bent  ? Hip Flexion Limitations 2#; 1 HAND SUPPORT   ? Hip Abduction Stengthening;Both;10 reps;Knee straight   ? Abduction Limitations 2#; 2 hand support   ? ?  ?  ? ?  ? ? ? ? ? ? Balance Exercises - 04/29/21 0001   ? ?  ? Balance Exercises: Standing  ? Stepping Strategy Anterior;Lateral;10 reps;UE support   ? Stepping Strategy Limitations R/L fwd and lateral stepping over rolled up yoga x 10 each   ? Toe Raise Both;10 reps   counter support  ? ?  ?  ? ?  ? ? ? ? ? ? ? PT Short Term Goals - 04/06/21 1733   ? ?  ? PT SHORT TERM GOAL #1  ? Title Patient to be independent with initial HEP.   ? Time 2   ? Period Weeks   ? Status Achieved   04/06/21- reports good compliance and daily performance.  ? Target Date 03/30/21   ? ?  ?  ? ?  ? ? ? ? PT Long Term Goals - 03/18/21 1637   ? ?  ? PT LONG TERM GOAL #1  ? Title Patient to be independent with advanced HEP.   ? Time 8   ? Period Weeks   ? Status On-going   ? Target Date 05/11/21   ?  ? PT LONG TERM GOAL #2  ? Title Patient will demonstrate improved endurance by completing 900' in 6MWT to access community.   ? Baseline 375' with 2 seated rest breaks needed, SPC used   ? Time 8   ? Period Weeks   ? Status On-going   ? Target Date 05/11/21   ?  ? PT LONG TERM GOAL #3  ? Title Patient will score >19/24 on DGI to decrease risk of falls.   ? Baseline NT today   ? Time 8   ? Period Weeks   ? Status On-going   ? Target Date 05/11/21   ?  ? PT LONG TERM GOAL #4  ? Title Pt. will report 50% improvement in LLE pain.   ? Time 8   ? Period Weeks   ? Status New   ? Target Date 05/11/21   ?  ? PT LONG TERM GOAL #5  ? Title Patient to score atleast 45/56 on BERG in order to decrease risk of falls.   ? Time 8   ? Period Weeks   ? Status On-going   ? Target Date 05/11/21   ? ?  ?  ? ?  ? ? ? ? ? ? ? ?  Plan - 04/29/21 1409   ? ? Clinical Impression Statement Pt responded well to treatment. Postural cuing provided and cues for  eccentric control with foot descent to ground with exercises. Increased height of step over obstacle today to full sized yoga mat and progressed to dynamic stepping over obstacles. Pt reports some improvement in overall L LE pain as he is able to get around more easily. Pt would continue to benefit from more balance and hip strengthening to allow for ease of mobility.   ? Personal Factors and Comorbidities Comorbidity 3+   ? Comorbidities skin CA, liver transplant, HTN, lumbar DDD   ? PT Frequency 2x / week   ? PT Duration 8 weeks   ? PT Treatment/Interventions ADLs/Self Care Home Management;Gait training;Stair training;Functional mobility training;Therapeutic activities;Therapeutic exercise;Balance training;Neuromuscular re-education;Patient/family education;Manual techniques;Iontophoresis '4mg'$ /ml Dexamethasone;Moist Heat;Cryotherapy;Passive range of motion;Dry needling;Joint Manipulations   ? PT Next Visit Plan LE strengthening and endurance, MT to left quads/ITB   ? Consulted and Agree with Plan of Care Patient   ? ?  ?  ? ?  ? ? ?Patient will benefit from skilled therapeutic intervention in order to improve the following deficits and impairments:  Abnormal gait, Decreased activity tolerance, Decreased endurance, Decreased range of motion, Decreased strength, Impaired sensation, Pain, Decreased balance, Decreased mobility, Decreased safety awareness, Difficulty walking, Increased muscle spasms ? ?Visit Diagnosis: ?Chronic pain of left knee ? ?Muscle weakness (generalized) ? ?Difficulty in walking, not elsewhere classified ? ?Unsteadiness on feet ? ?Chronic left-sided low back pain with left-sided sciatica ? ?Other abnormalities of gait and mobility ? ? ? ? ?Problem List ?There are no problems to display for this patient. ? ? ?Artist Pais, PTA ?04/29/2021, 3:33 PM ? ?[place] ?[facility] ?[place] ?[place], [place], [address] ?Phone: [phone]   Fax:   [phone] ? ?Name: Andrew Banks ?MRN: [id number] ?Date of Birth: [birth year] ? ? ? ?

## 2021-05-03 ENCOUNTER — Ambulatory Visit: Payer: No Typology Code available for payment source | Attending: Emergency Medicine | Admitting: Physical Therapy

## 2021-05-03 ENCOUNTER — Encounter: Payer: Self-pay | Admitting: Physical Therapy

## 2021-05-03 DIAGNOSIS — M5442 Lumbago with sciatica, left side: Secondary | ICD-10-CM | POA: Insufficient documentation

## 2021-05-03 DIAGNOSIS — M25562 Pain in left knee: Secondary | ICD-10-CM | POA: Insufficient documentation

## 2021-05-03 DIAGNOSIS — R262 Difficulty in walking, not elsewhere classified: Secondary | ICD-10-CM | POA: Diagnosis present

## 2021-05-03 DIAGNOSIS — R2689 Other abnormalities of gait and mobility: Secondary | ICD-10-CM | POA: Insufficient documentation

## 2021-05-03 DIAGNOSIS — G8929 Other chronic pain: Secondary | ICD-10-CM | POA: Insufficient documentation

## 2021-05-03 DIAGNOSIS — R2681 Unsteadiness on feet: Secondary | ICD-10-CM | POA: Insufficient documentation

## 2021-05-03 DIAGNOSIS — M6281 Muscle weakness (generalized): Secondary | ICD-10-CM | POA: Diagnosis present

## 2021-05-03 NOTE — Therapy (Signed)
[place] ?[facility] ?[place] ?[place], [place], [address] ?Phone: [phone]   Fax:  [phone] ? ?Physical Therapy Treatment/Progress Note ? ?Progress Note ?Reporting Period 03/16/2021 to 05/03/2021 ? ?See note below for Objective Data and Assessment of Progress/Goals.  ? ? ? ?Patient Details  ?Name: Andrew Banks ?MRN: [id number] ?Date of Birth: [birth year] ?Referring Provider (PT): Little, Traci PA-C ? ? ?Encounter Date: 05/03/2021 ? ? PT End of Session - 05/03/21 1420   ? ? Visit Number 10   ? Number of Visits 15   ? Date for PT Re-Evaluation 06/28/21   ? Authorization Type [facility]   ? Authorization - Visit Number 7   ? Authorization - Number of Visits 15   ? Progress Note Due on Visit 10   ? PT Start Time 1410   ? PT Stop Time 7619   ? PT Time Calculation (min) 35 min   ? Activity Tolerance Patient tolerated treatment well   ? Behavior During Therapy [facility] for tasks assessed/performed   ? ?  ?  ? ?  ? ? ?Past Medical History:  ?Diagnosis Date  ? Arthritis   ? Degenerative disc disease, lumbar   ? Hypertension   ? Liver transplanted [facility])   ? Skin cancer   ? ? ?Past Surgical History:  ?Procedure Laterality Date  ? liver transplant    ? ? ?There were no vitals filed for this visit. ? ? Subjective Assessment - 05/03/21 1421   ? ? Subjective Pt. reports he had a lot of pain over the weekend, with the high pressure system.     Reports only intermittant relief from pain in general.  Pain mostly L side of body down to toes, feels like he stubbed toes.   ? Pertinent History s/p ORIF Pelvic fx 11/12/19;  s/p IMN L distal femur 08/12/2019;  s/p L IM nailing femur 11/02/20;  L foot dropskin CA, liver transplant, HTN, lumbar DDD   ? Diagnostic tests XR 12/22/20: 1.  Healing unstable traumatic U-shaped sacral fracture (spinopelvic dissociation) status post internal fixation with bilateral iliosacral screws at S1 and A left transsacral transiliac screw at S2. Both hips are  located. No hardware complication. No gross change in alignment.   2.  Healed left ischiopubic ramus fracture.   3.  Healing peri-implant pertrochanteric left femoral fracture status post cephalomedullary nail fixation. No hardware is intact without loosening. No change in fracture alignment.   4.  Healed bicondylar distal femoral fracture (extending proximally to the distal third diaphysis) status post ORIF with lateral buttress plate and screws. No hardware complication or change in fracture alignment.   5.  Medial patellar fracture is is not visible on this examination and seen on the CT left knee dated 11/11/2019.   6.  Healed nondisplaced fibular neck fracture.   7.  No new fractures.   8.  No joint misalignment (both sacroiliac, pubic symphysis, and both hips, and left knee).   9.  Status post embolization of multiple arteries (left L3 and L4 lumbar, right L4 lumbar, 2 branches of the left deep circumflex iliac artery, and bilateral iliolumbar arteries (per Interventional Radiology procedure note dated 11/10/2020) with multiple embolization coils in and around the anatomic pelvis. Vascular plug in the right groin.   10.  Status post ACL reconstruction. Femoral interference screw and partially imaged tibial screw are intact without loosening.   11.  Osteopenia.   12.  Moderate degenerative changes of  the knee.   13.  Mild degenerative changes of the hips and pubic symphysis.   14.  Degenerative changes of the partially imaged lumbosacral spine.   15.  Partially imaged mesh tacks in the abdomen (along the ventral abdominal wall when correlating with prior CT abdomen/pelvis dated 11/16/2019).   ? Patient Stated Goals improve LLE strength, improve L knee ROM, not fall again   ? Currently in Pain? Yes   ? Pain Score 7    ? Pain Location Back   ? Pain Orientation Left   ? Pain Descriptors / Indicators Aching   ? Pain Score 8   ? Pain Location Hip   ? Pain Orientation Left   ? ?  ?  ? ?  ? ? ? ? ? [facility] PT Assessment  - 05/03/21 0001   ? ?  ? Assessment  ? Medical Diagnosis M79.606 (ICD-10-CM) - Leg pain   ? Referring Provider (PT) Little, Traci PA-C   ? Onset Date/Surgical Date 11/02/20   ?  ? Precautions  ? Precautions Fall   ?  ? Restrictions  ? Weight Bearing Restrictions No   ?  ? Transfers  ? Five time sit to stand comments  11 seconds   ?  ? 6 minute walk test results   ? Aerobic Endurance Distance Walked 750   ? Endurance additional comments with 4WRW, no rest breaks, increased LLE pain   ?  ? Balance  ? Balance Assessed Yes   ?  ? Standardized Balance Assessment  ? Standardized Balance Assessment Berg Balance Test   ? Five times sit to stand comments  11 seconds   ?  ? Berg Balance Test  ? Sit to Stand Able to stand without using hands and stabilize independently   ? Standing Unsupported Able to stand safely 2 minutes   ? Sitting with Back Unsupported but Feet Supported on Floor or Stool Able to sit safely and securely 2 minutes   ? Stand to Sit Sits safely with minimal use of hands   ? Transfers Able to transfer safely, minor use of hands   ? Standing Unsupported with Eyes Closed Able to stand 10 seconds safely   ? Standing Unsupported with Feet Together Able to place feet together independently and stand 1 minute safely   ? From Standing, Reach Forward with Outstretched Arm Can reach forward >12 cm safely (5")   ? From Standing Position, Pick up Object from Floor Able to pick up shoe, needs supervision   ? From Standing Position, Turn to Look Behind Over each Shoulder Looks behind from both sides and weight shifts well   ? Turn 360 Degrees Able to turn 360 degrees safely but slowly   ? Standing Unsupported, Alternately Place Feet on Step/Stool Able to stand independently and complete 8 steps >20 seconds   ? Standing Unsupported, One Foot in Front Able to plae foot ahead of the other independently and hold 30 seconds   ? Standing on One Leg Tries to lift leg/unable to hold 3 seconds but remains standing independently    ? Total Score 47   ? ?  ?  ? ?  ? ? ? ? ? ? ? ? ? ? ? ? ? ? ? ? [facility] Adult PT Treatment/Exercise - 05/03/21 0001   ? ?  ? Therapeutic Activites   ? Therapeutic Activities Other Therapeutic Activities   6MWT & Berg to assess progress towards goals.  ? ?  ?  ? ?  ? ? ? ? ? ? ? ? ? ? ? ?  PT Short Term Goals - 04/06/21 1733   ? ?  ? PT SHORT TERM GOAL #1  ? Title Patient to be independent with initial HEP.   ? Time 2   ? Period Weeks   ? Status Achieved   04/06/21- reports good compliance and daily performance.  ? Target Date 03/30/21   ? ?  ?  ? ?  ? ? ? ? PT Long Term Goals - 05/03/21 1419   ? ?  ? PT LONG TERM GOAL #1  ? Title Patient to be independent with advanced HEP.   ? Time 8   ? Period Weeks   ? Status On-going   05/03/21- met for current  ? Target Date 05/11/21   ?  ? PT LONG TERM GOAL #2  ? Title Patient will demonstrate improved endurance by completing 900' in 6MWT to access community.   ? Baseline 375' with 2 seated rest breaks needed, SPC used   ? Time 8   ? Period Weeks   ? Status On-going   05/03/21- 750' in 6 minutes with 4WRW, was able to complete 900' without rest break today.  ? Target Date 06/28/21   ?  ? PT LONG TERM GOAL #3  ? Title Patient will score >19/24 on DGI to decrease risk of falls.   ? Baseline NT today   ? Time 8   ? Period Weeks   ? Status On-going   04/22/21- 15/24  ? Target Date 06/28/21   ?  ? PT LONG TERM GOAL #4  ? Title Pt. will report 50% improvement in LLE pain.   ? Time 8   ? Period Weeks   ? Status On-going   05/03/21- no significant change.  ? Target Date 06/28/21   ?  ? PT LONG TERM GOAL #5  ? Title Patient to score atleast 45/56 on BERG in order to decrease risk of falls.   ? Time 8   ? Period Weeks   ? Status Achieved   05/03/21- 47/56  ? Target Date 05/11/21   ? ?  ?  ? ?  ? ? ? ? ? ? ? ? Plan - 05/03/21 1603   ? ? Clinical Impression Statement Pt. is making good progress reporting no new falls and feeling much stronger.  His LLE pain has not improved significant, and he has  visit scheduled to see MD about neuropathy.  His balance is improving, he scored 47/56 on Berg which putss him at lower risk for falls, but still needs AD for safety.  He also demonstrates improved endurance,

## 2021-05-06 ENCOUNTER — Ambulatory Visit: Payer: No Typology Code available for payment source | Admitting: Physical Therapy

## 2021-05-06 ENCOUNTER — Encounter: Payer: Self-pay | Admitting: Physical Therapy

## 2021-05-06 DIAGNOSIS — M25562 Pain in left knee: Secondary | ICD-10-CM | POA: Diagnosis not present

## 2021-05-06 DIAGNOSIS — R262 Difficulty in walking, not elsewhere classified: Secondary | ICD-10-CM

## 2021-05-06 DIAGNOSIS — M6281 Muscle weakness (generalized): Secondary | ICD-10-CM

## 2021-05-06 DIAGNOSIS — G8929 Other chronic pain: Secondary | ICD-10-CM

## 2021-05-06 DIAGNOSIS — R2681 Unsteadiness on feet: Secondary | ICD-10-CM

## 2021-05-06 DIAGNOSIS — R2689 Other abnormalities of gait and mobility: Secondary | ICD-10-CM

## 2021-05-06 NOTE — Therapy (Signed)
[place] ?[facility] ?[place] [address] ?[place], [place], [address] ?Phone: [phone]   Fax:  [phone] ? ?Physical Therapy Treatment ? ?Patient Details  ?Name: Andrew Banks ?MRN: [id number] ?Date of Birth: 1950-03-29 ?Referring Provider (PT): Little, Traci PA-C ? ? ?Encounter Date: 05/06/2021 ? ? PT End of Session - 05/06/21 1406   ? ? Visit Number 11   ? Number of Visits 15   ? Date for PT Re-Evaluation 06/28/21   ? Authorization Type [facility]   ? Authorization - Visit Number 10   ? Authorization - Number of Visits 15   ? Progress Note Due on Visit 20   ? PT Start Time 7035   ? PT Stop Time 0093   ? PT Time Calculation (min) 41 min   ? Activity Tolerance Patient tolerated treatment well   ? Behavior During Therapy [facility] for tasks assessed/performed   ? ?  ?  ? ?  ? ? ?Past Medical History:  ?Diagnosis Date  ? Arthritis   ? Degenerative disc disease, lumbar   ? Hypertension   ? Liver transplanted [facility])   ? Skin cancer   ? ? ?Past Surgical History:  ?Procedure Laterality Date  ? liver transplant    ? ? ?There were no vitals filed for this visit. ? ? Subjective Assessment - 05/06/21 1405   ? ? Subjective Pt. reports more back pain today, sharp when walking, R lower side.  Started this morning, thinks he moved wrong.   ? Pertinent History s/p ORIF Pelvic fx 11/12/19;  s/p IMN L distal femur 08/12/2019;  s/p L IM nailing femur 11/02/20;  L foot dropskin CA, liver transplant, HTN, lumbar DDD   ? Diagnostic tests XR 12/22/20: 1.  Healing unstable traumatic U-shaped sacral fracture (spinopelvic dissociation) status post internal fixation with bilateral iliosacral screws at S1 and A left transsacral transiliac screw at S2. Both hips are located. No hardware complication. No gross change in alignment.   2.  Healed left ischiopubic ramus fracture.   3.  Healing peri-implant pertrochanteric left femoral fracture status post cephalomedullary nail fixation. No hardware is intact  without loosening. No change in fracture alignment.   4.  Healed bicondylar distal femoral fracture (extending proximally to the distal third diaphysis) status post ORIF with lateral buttress plate and screws. No hardware complication or change in fracture alignment.   5.  Medial patellar fracture is is not visible on this examination and seen on the CT left knee dated 11/11/2019.   6.  Healed nondisplaced fibular neck fracture.   7.  No new fractures.   8.  No joint misalignment (both sacroiliac, pubic symphysis, and both hips, and left knee).   9.  Status post embolization of multiple arteries (left L3 and L4 lumbar, right L4 lumbar, 2 branches of the left deep circumflex iliac artery, and bilateral iliolumbar arteries (per Interventional Radiology procedure note dated 11/10/2020) with multiple embolization coils in and around the anatomic pelvis. Vascular plug in the right groin.   10.  Status post ACL reconstruction. Femoral interference screw and partially imaged tibial screw are intact without loosening.   11.  Osteopenia.   12.  Moderate degenerative changes of the knee.   13.  Mild degenerative changes of the hips and pubic symphysis.   14.  Degenerative changes of the partially imaged lumbosacral spine.   15.  Partially imaged mesh tacks in the abdomen (along the ventral abdominal wall when correlating with  prior CT abdomen/pelvis dated 11/16/2019).   ? Patient Stated Goals improve LLE strength, improve L knee ROM, not fall again   ? Currently in Pain? Yes   ? Pain Score 6    ? Pain Location Back   ? ?  ?  ? ?  ? ? ? ? ? ? ? ? ? ? ? ? ? ? ? ? ? ? ? ? [facility] Adult PT Treatment/Exercise - 05/06/21 0001   ? ?  ? Knee/Hip Exercises: Stretches  ? Other Knee/Hip Stretches seated LBP with green P-ball to decrease muscle spasm.   ?  ? Knee/Hip Exercises: Aerobic  ? Nustep L7 x 6 min   ?  ? Knee/Hip Exercises: Standing  ? Heel Raises Both;10 reps   ? Hip Abduction Stengthening;Both;10 reps   ? Hip Extension  Stengthening;Both;10 reps   ? Functional Squat 10 reps   ?  ? Manual Therapy  ? Manual Therapy Soft tissue mobilization   ? Manual therapy comments to decrease muscle spasm and pain   ? Soft tissue mobilization IASTM with stick and TPR to R lower back/R hip to decrease muscle spasm.   ? ?  ?  ? ?  ? ? ? ? ? ? Balance Exercises - 05/06/21 0001   ? ?  ? Balance Exercises: Standing  ? Stepping Strategy Anterior;Lateral;10 reps;UE support   ? Stepping Strategy Limitations R/L fwd and lateral stepping over rolled up yoga x 10 each   ? ?  ?  ? ?  ? ? ? ? ? ? ? PT Short Term Goals - 04/06/21 1733   ? ?  ? PT SHORT TERM GOAL #1  ? Title Patient to be independent with initial HEP.   ? Time 2   ? Period Weeks   ? Status Achieved   04/06/21- reports good compliance and daily performance.  ? Target Date 03/30/21   ? ?  ?  ? ?  ? ? ? ? PT Long Term Goals - 05/03/21 1419   ? ?  ? PT LONG TERM GOAL #1  ? Title Patient to be independent with advanced HEP.   ? Time 8   ? Period Weeks   ? Status On-going   05/03/21- met for current  ? Target Date 05/11/21   ?  ? PT LONG TERM GOAL #2  ? Title Patient will demonstrate improved endurance by completing 900' in 6MWT to access community.   ? Baseline 375' with 2 seated rest breaks needed, SPC used   ? Time 8   ? Period Weeks   ? Status On-going   05/03/21- 750' in 6 minutes with 4WRW, was able to complete 900' without rest break today.  ? Target Date 06/28/21   ?  ? PT LONG TERM GOAL #3  ? Title Patient will score >19/24 on DGI to decrease risk of falls.   ? Baseline NT today   ? Time 8   ? Period Weeks   ? Status On-going   04/22/21- 15/24  ? Target Date 06/28/21   ?  ? PT LONG TERM GOAL #4  ? Title Pt. will report 50% improvement in LLE pain.   ? Time 8   ? Period Weeks   ? Status On-going   05/03/21- no significant change.  ? Target Date 06/28/21   ?  ? PT LONG TERM GOAL #5  ? Title Patient to score atleast 45/56 on BERG in order to decrease risk of falls.   ?  Time 8   ? Period Weeks   ? Status  Achieved   05/03/21- 47/56  ? Target Date 05/11/21   ? ?  ?  ? ?  ? ? ? ? ? ? ? ? Plan - 05/06/21 1719   ? ? Clinical Impression Statement Pt. arrived today complaining of increased R sided low back pain.  Started session with gentle stretches and then manual therapy to decrease muscle spasm and pain, afterwhich he felt he would be able to participate in standing exercises.  Continued to work on hip strengthening and stepping, patient noted today he was able to raise L foot more and also better endurance with stepping activity.   Close SBA/CGA still needed even with counter support as fatigued had more difficulty clearing mats.  He would benefit from continued skilled therapy.   ? Personal Factors and Comorbidities Comorbidity 3+   ? Comorbidities skin CA, liver transplant, HTN, lumbar DDD   ? PT Frequency 2x / week   ? PT Duration 8 weeks   ? PT Treatment/Interventions ADLs/Self Care Home Management;Gait training;Stair training;Functional mobility training;Therapeutic activities;Therapeutic exercise;Balance training;Neuromuscular re-education;Patient/family education;Manual techniques;Iontophoresis 27m/ml Dexamethasone;Moist Heat;Cryotherapy;Passive range of motion;Dry needling;Joint Manipulations;Aquatic Therapy   ? PT Next Visit Plan continue balance and LE strengthening.   ? Consulted and Agree with Plan of Care Patient   ? ?  ?  ? ?  ? ? ?Patient will benefit from skilled therapeutic intervention in order to improve the following deficits and impairments:  Abnormal gait, Decreased activity tolerance, Decreased endurance, Decreased range of motion, Decreased strength, Impaired sensation, Pain, Decreased balance, Decreased mobility, Decreased safety awareness, Difficulty walking, Increased muscle spasms ? ?Visit Diagnosis: ?Chronic pain of left knee ? ?Muscle weakness (generalized) ? ?Difficulty in walking, not elsewhere classified ? ?Unsteadiness on feet ? ?Chronic left-sided low back pain with left-sided  sciatica ? ?Other abnormalities of gait and mobility ? ? ? ? ?Problem List ?There are no problems to display for this patient. ? ? ?ERennie Natter PT, DPT  ?05/06/2021, 5:25 PM ? ?[place] [facility]

## 2021-05-10 ENCOUNTER — Encounter: Payer: Self-pay | Admitting: Physical Therapy

## 2021-05-10 ENCOUNTER — Ambulatory Visit: Payer: No Typology Code available for payment source | Admitting: Physical Therapy

## 2021-05-10 DIAGNOSIS — R262 Difficulty in walking, not elsewhere classified: Secondary | ICD-10-CM

## 2021-05-10 DIAGNOSIS — R2689 Other abnormalities of gait and mobility: Secondary | ICD-10-CM

## 2021-05-10 DIAGNOSIS — M25562 Pain in left knee: Secondary | ICD-10-CM | POA: Diagnosis not present

## 2021-05-10 DIAGNOSIS — M6281 Muscle weakness (generalized): Secondary | ICD-10-CM

## 2021-05-10 DIAGNOSIS — G8929 Other chronic pain: Secondary | ICD-10-CM

## 2021-05-10 DIAGNOSIS — R2681 Unsteadiness on feet: Secondary | ICD-10-CM

## 2021-05-10 NOTE — Therapy (Signed)
[place] ?[facility] ?[address] ?[place], [place], [address] ?Phone: [phone]   Fax:  [phone] ? ?Physical Therapy Treatment ? ?Patient Details  ?Name: Andrew Banks ?MRN: [id number] ?Date of Birth: 1950-12-10 ?Referring Provider (PT): Little, Traci PA-C ? ? ?Encounter Date: 05/10/2021 ? ? PT End of Session - 05/10/21 1408   ? ? Visit Number 12   ? Number of Visits 15   ? Date for PT Re-Evaluation 06/28/21   ? Authorization Type [facility]   ? Authorization - Visit Number 10   ? Authorization - Number of Visits 15   ? Progress Note Due on Visit 20   ? PT Start Time 3612   ? PT Stop Time 2449   ? PT Time Calculation (min) 40 min   ? Activity Tolerance Patient tolerated treatment well   ? Behavior During Therapy [facility] for tasks assessed/performed   ? ?  ?  ? ?  ? ? ?Past Medical History:  ?Diagnosis Date  ? Arthritis   ? Degenerative disc disease, lumbar   ? Hypertension   ? Liver transplanted [facility])   ? Skin cancer   ? ? ?Past Surgical History:  ?Procedure Laterality Date  ? liver transplant    ? ? ?There were no vitals filed for this visit. ? ? Subjective Assessment - 05/10/21 1406   ? ? Subjective Pt. reports he was sick Thursday, finally feeling better yesterday.  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   ? Pertinent History s/p ORIF Pelvic fx 11/12/19;  s/p IMN L distal femur 08/12/2019;  s/p L IM nailing femur 11/02/20;  L foot dropskin CA, liver transplant, HTN, lumbar DDD   ? Diagnostic tests XR 12/22/20: 1.  Healing unstable traumatic U-shaped sacral fracture (spinopelvic dissociation) status post internal fixation with bilateral iliosacral screws at S1 and A left transsacral transiliac screw at S2. Both hips are located. No hardware complication. No gross change in alignment.   2.  Healed left ischiopubic ramus fracture.   3.  Healing peri-implant pertrochanteric left femoral fracture status post cephalomedullary nail fixation. No  hardware is intact without loosening. No change in fracture alignment.   4.  Healed bicondylar distal femoral fracture (extending proximally to the distal third diaphysis) status post ORIF with lateral buttress plate and screws. No hardware complication or change in fracture alignment.   5.  Medial patellar fracture is is not visible on this examination and seen on the CT left knee dated 11/11/2019.   6.  Healed nondisplaced fibular neck fracture.   7.  No new fractures.   8.  No joint misalignment (both sacroiliac, pubic symphysis, and both hips, and left knee).   9.  Status post embolization of multiple arteries (left L3 and L4 lumbar, right L4 lumbar, 2 branches of the left deep circumflex iliac artery, and bilateral iliolumbar arteries (per Interventional Radiology procedure note dated 11/10/2020) with multiple embolization coils in and around the anatomic pelvis. Vascular plug in the right groin.   10.  Status post ACL reconstruction. Femoral interference screw and partially imaged tibial screw are intact without loosening.   11.  Osteopenia.   12.  Moderate degenerative changes of the knee.   13.  Mild degenerative changes of the hips and pubic symphysis.   14.  Degenerative changes of the partially imaged lumbosacral spine.   15.  Partially imaged mesh tacks in the abdomen (along the ventral abdominal wall when correlating with prior  CT abdomen/pelvis dated 11/16/2019).   ? Patient Stated Goals improve LLE strength, improve L knee ROM, not fall again   ? Currently in Pain? Yes   ? Pain Score 5    ? Pain Location Hip   left side back down LLE  ? Pain Orientation Left   ? ?  ?  ? ?  ? ? ? ? ? ? ? ? ? ? ? ? ? ? ? ? ? ? ? ? [place] Adult PT Treatment/Exercise - 05/10/21 0001   ? ?  ? Knee/Hip Exercises: Stretches  ? Other Knee/Hip Stretches seated LB stretch with green P-ball to decrease muscle spasm.   ?  ? Knee/Hip Exercises: Aerobic  ? Nustep L6 x 6 min   ?  ? Knee/Hip Exercises: Machines for Strengthening  ?  Cybex Knee Extension 25# 2x 10   ? Cybex Knee Flexion 20# 2 x 10, 25# 1 x 10, 1 x 5   ? Other Machine rows 25# 2 x 10, lat pull down 15# x 10, 20# x 10, 25# x 10.   ? ?  ?  ? ?  ? ? ? ? ? ? Balance Exercises - 05/10/21 0001   ? ?  ? Balance Exercises: Standing  ? Standing, One Foot on a Step Eyes open;4 inch;Limitations   ? Standing, One Foot on a Step Limitations toe taps flat side yoga block 2 x 10 bil   ? ?  ?  ? ?  ? ? ? ? ? ? ? PT Short Term Goals - 04/06/21 1733   ? ?  ? PT SHORT TERM GOAL #1  ? Title Patient to be independent with initial HEP.   ? Time 2   ? Period Weeks   ? Status Achieved   04/06/21- reports good compliance and daily performance.  ? Target Date 03/30/21   ? ?  ?  ? ?  ? ? ? ? PT Long Term Goals - 05/03/21 1419   ? ?  ? PT LONG TERM GOAL #1  ? Title Patient to be independent with advanced HEP.   ? Time 8   ? Period Weeks   ? Status On-going   05/03/21- met for current  ? Target Date 05/11/21   ?  ? PT LONG TERM GOAL #2  ? Title Patient will demonstrate improved endurance by completing 900' in 6MWT to access community.   ? Baseline 375' with 2 seated rest breaks needed, SPC used   ? Time 8   ? Period Weeks   ? Status On-going   05/03/21- 750' in 6 minutes with 4WRW, was able to complete 900' without rest break today.  ? Target Date 06/28/21   ?  ? PT LONG TERM GOAL #3  ? Title Patient will score >19/24 on DGI to decrease risk of falls.   ? Baseline NT today   ? Time 8   ? Period Weeks   ? Status On-going   04/22/21- 15/24  ? Target Date 06/28/21   ?  ? PT LONG TERM GOAL #4  ? Title Pt. will report 50% improvement in LLE pain.   ? Time 8   ? Period Weeks   ? Status On-going   05/03/21- no significant change.  ? Target Date 06/28/21   ?  ? PT LONG TERM GOAL #5  ? Title Patient to score atleast 45/56 on BERG in order to decrease risk of falls.   ? Time 8   ?  Period Weeks   ? Status Achieved   05/03/21- 47/56  ? Target Date 05/11/21   ? ?  ?  ? ?  ? ? ? ? ? ? ? ? Plan - 05/10/21 1722   ? ? Clinical  Impression Statement Patient was feeling better today, able to tolerated exercises, today focused on resistance training to build muscle mass, which he tolerated well although started to complain of discomfort in low back with last set of lat pulls.  With balance training focused on toe taps to promote weight shift into SLS, very challenged with L SLS due to pain and also rapid fatigue.  He would benefit from continued skilled therapy.   ? Personal Factors and Comorbidities Comorbidity 3+   ? Comorbidities skin CA, liver transplant, HTN, lumbar DDD   ? PT Frequency 2x / week   ? PT Duration 8 weeks   ? PT Treatment/Interventions ADLs/Self Care Home Management;Gait training;Stair training;Functional mobility training;Therapeutic activities;Therapeutic exercise;Balance training;Neuromuscular re-education;Patient/family education;Manual techniques;Iontophoresis 10m/ml Dexamethasone;Moist Heat;Cryotherapy;Passive range of motion;Dry needling;Joint Manipulations;Aquatic Therapy   ? PT Next Visit Plan continue balance and LE strengthening.   ? Consulted and Agree with Plan of Care Patient   ? ?  ?  ? ?  ? ? ?Patient will benefit from skilled therapeutic intervention in order to improve the following deficits and impairments:  Abnormal gait, Decreased activity tolerance, Decreased endurance, Decreased range of motion, Decreased strength, Impaired sensation, Pain, Decreased balance, Decreased mobility, Decreased safety awareness, Difficulty walking, Increased muscle spasms ? ?Visit Diagnosis: ?Chronic pain of left knee ? ?Muscle weakness (generalized) ? ?Difficulty in walking, not elsewhere classified ? ?Unsteadiness on feet ? ?Chronic left-sided low back pain with left-sided sciatica ? ?Other abnormalities of gait and mobility ? ? ? ? ?Problem List ?There are no problems to display for this patient. ? ? ?ERennie Natter PT, DPT  ?05/10/2021, 5:25 PM ? ?[place] [facility] [place] ?[address]?[place] [address]?Phone: [phone]  Fax:  [phone]? ?Name: Andrew Banks?MRN: [id number]?Date of Birth: 2Feb 09, 1952? ? ? ?

## 2021-05-17 ENCOUNTER — Encounter: Payer: Self-pay | Admitting: Physical Therapy

## 2021-05-17 ENCOUNTER — Ambulatory Visit: Payer: No Typology Code available for payment source | Admitting: Physical Therapy

## 2021-05-17 DIAGNOSIS — G8929 Other chronic pain: Secondary | ICD-10-CM

## 2021-05-17 DIAGNOSIS — R2681 Unsteadiness on feet: Secondary | ICD-10-CM

## 2021-05-17 DIAGNOSIS — M25562 Pain in left knee: Secondary | ICD-10-CM | POA: Diagnosis not present

## 2021-05-17 DIAGNOSIS — M6281 Muscle weakness (generalized): Secondary | ICD-10-CM

## 2021-05-17 DIAGNOSIS — R2689 Other abnormalities of gait and mobility: Secondary | ICD-10-CM

## 2021-05-17 DIAGNOSIS — R262 Difficulty in walking, not elsewhere classified: Secondary | ICD-10-CM

## 2021-05-17 NOTE — Therapy (Signed)
[place] ?[facility] [place] ?[address]  [address] ?[place], [place], [address] ?Phone: [phone]   Fax:  [phone] ? ?Physical Therapy Treatment ? ?Patient Details  ?Name: Andrew Banks ?MRN: [id number] ?Date of Birth: 05/20/1950 ?Referring Provider (PT): Little, Traci PA-C ? ? ?Encounter Date: 05/17/2021 ? ? PT End of Session - 05/17/21 1537   ? ? Visit Number 13   ? Number of Visits 15   ? Date for PT Re-Evaluation 06/28/21   ? Authorization Type [facility]   ? Authorization - Visit Number 10   ? Authorization - Number of Visits 15   ? Progress Note Due on Visit 20   ? PT Start Time 1531   ? PT Stop Time 1615   ? PT Time Calculation (min) 44 min   ? Activity Tolerance Patient tolerated treatment well   ? Behavior During Therapy [facility] for tasks assessed/performed   ? ?  ?  ? ?  ? ? ?Past Medical History:  ?Diagnosis Date  ? Arthritis   ? Degenerative disc disease, lumbar   ? Hypertension   ? Liver transplanted (HCC)   ? Skin cancer   ? ? ?Past Surgical History:  ?Procedure Laterality Date  ? liver transplant    ? ? ?There were no vitals filed for this visit. ? ? Subjective Assessment - 05/17/21 1536   ? ? Subjective Pt. reports feeling better, less pain "its warming up now."   ? Pertinent History s/p ORIF Pelvic fx 11/12/19;  s/p IMN L distal femur 08/12/2019;  s/p L IM nailing femur 11/02/20;  L foot dropskin CA, liver transplant, HTN, lumbar DDD   ? Diagnostic tests XR 12/22/20: 1.  Healing unstable traumatic U-shaped sacral fracture (spinopelvic dissociation) status post internal fixation with bilateral iliosacral screws at S1 and A left transsacral transiliac screw at S2. Both hips are located. No hardware complication. No gross change in alignment.   2.  Healed left ischiopubic ramus fracture.   3.  Healing peri-implant pertrochanteric left femoral fracture status post cephalomedullary nail fixation. No hardware is intact without loosening. No change in fracture alignment.   4.   Healed bicondylar distal femoral fracture (extending proximally to the distal third diaphysis) status post ORIF with lateral buttress plate and screws. No hardware complication or change in fracture alignment.   5.  Medial patellar fracture is is not visible on this examination and seen on the CT left knee dated 11/11/2019.   6.  Healed nondisplaced fibular neck fracture.   7.  No new fractures.   8.  No joint misalignment (both sacroiliac, pubic symphysis, and both hips, and left knee).   9.  Status post embolization of multiple arteries (left L3 and L4 lumbar, right L4 lumbar, 2 branches of the left deep circumflex iliac artery, and bilateral iliolumbar arteries (per Interventional Radiology procedure note dated 11/10/2020) with multiple embolization coils in and around the anatomic pelvis. Vascular plug in the right groin.   10.  Status post ACL reconstruction. Femoral interference screw and partially imaged tibial screw are intact without loosening.   11.  Osteopenia.   12.  Moderate degenerative changes of the knee.   13.  Mild degenerative changes of the hips and pubic symphysis.   14.  Degenerative changes of the partially imaged lumbosacral spine.   15.  Partially imaged mesh tacks in the abdomen (along the ventral abdominal wall when correlating with prior CT abdomen/pelvis dated 11/16/2019).   ? Patient Stated   Goals improve LLE strength, improve L knee ROM, not fall again   ? Currently in Pain? Yes   ? Pain Score 5    ? Pain Location Hip   ? Pain Orientation Left   ? ?  ?  ? ?  ? ? ? ? ? ? ? ? ? ? ? ? ? ? ? ? ? ? ? ? [facility] Adult PT Treatment/Exercise - 05/17/21 0001   ? ?  ? Knee/Hip Exercises: Aerobic  ? Nustep L6 x 6 min   ?  ? Knee/Hip Exercises: Machines for Strengthening  ? Cybex Knee Extension 25# 3x 10   ? Cybex Knee Flexion 25# 3 x 10   ? Other Machine rows 25# 3 x 10; lat pull downs 25# 3 x 10   ?  ? Knee/Hip Exercises: Standing  ? Forward Step Up Right;Left;2 sets;10 reps;Hand Hold: 2;Step Height:  4"   ? Forward Step Up Limitations increased pain in L knee   ? ?  ?  ? ?  ? ? ? ? ? ? ? ? ? ? ? ? PT Short Term Goals - 04/06/21 1733   ? ?  ? PT SHORT TERM GOAL #1  ? Title Patient to be independent with initial HEP.   ? Time 2   ? Period Weeks   ? Status Achieved   04/06/21- reports good compliance and daily performance.  ? Target Date 03/30/21   ? ?  ?  ? ?  ? ? ? ? PT Long Term Goals - 05/03/21 1419   ? ?  ? PT LONG TERM GOAL #1  ? Title Patient to be independent with advanced HEP.   ? Time 8   ? Period Weeks   ? Status On-going   05/03/21- met for current  ? Target Date 05/11/21   ?  ? PT LONG TERM GOAL #2  ? Title Patient will demonstrate improved endurance by completing 900' in 6MWT to access community.   ? Baseline 375' with 2 seated rest breaks needed, SPC used   ? Time 8   ? Period Weeks   ? Status On-going   05/03/21- 750' in 6 minutes with 4WRW, was able to complete 900' without rest break today.  ? Target Date 06/28/21   ?  ? PT LONG TERM GOAL #3  ? Title Patient will score >19/24 on DGI to decrease risk of falls.   ? Baseline NT today   ? Time 8   ? Period Weeks   ? Status On-going   04/22/21- 15/24  ? Target Date 06/28/21   ?  ? PT LONG TERM GOAL #4  ? Title Pt. will report 50% improvement in LLE pain.   ? Time 8   ? Period Weeks   ? Status On-going   05/03/21- no significant change.  ? Target Date 06/28/21   ?  ? PT LONG TERM GOAL #5  ? Title Patient to score atleast 45/56 on BERG in order to decrease risk of falls.   ? Time 8   ? Period Weeks   ? Status Achieved   05/03/21- 47/56  ? Target Date 05/11/21   ? ?  ?  ? ?  ? ? ? ? ? ? ? ? Plan - 05/17/21 1626   ? ? Clinical Impression Statement Continued to focus today on resistance training.  He is starting to note that he is feeling much stronger and that as he strengthens, he is having   less pain.  He was able to tolerate all exercises well without complaint, with exception of step ups onto LLE, reporting increased L knee pain.  He would benefit from continued  skilled therapy.   ? Personal Factors and Comorbidities Comorbidity 3+   ? Comorbidities skin CA, liver transplant, HTN, lumbar DDD   ? PT Frequency 2x / week   ? PT Duration 8 weeks   ? PT Treatment/Interventions ADLs/Self Care Home Management;Gait training;Stair training;Functional mobility training;Therapeutic activities;Therapeutic exercise;Balance training;Neuromuscular re-education;Patient/family education;Manual techniques;Iontophoresis 69m/ml Dexamethasone;Moist Heat;Cryotherapy;Passive range of motion;Dry needling;Joint Manipulations;Aquatic Therapy   ? PT Next Visit Plan continue balance and LE strengthening.   ? Consulted and Agree with Plan of Care Patient   ? ?  ?  ? ?  ? ? ?Patient will benefit from skilled therapeutic intervention in order to improve the following deficits and impairments:  Abnormal gait, Decreased activity tolerance, Decreased endurance, Decreased range of motion, Decreased strength, Impaired sensation, Pain, Decreased balance, Decreased mobility, Decreased safety awareness, Difficulty walking, Increased muscle spasms ? ?Visit Diagnosis: ?Chronic pain of left knee ? ?Muscle weakness (generalized) ? ?Difficulty in walking, not elsewhere classified ? ?Unsteadiness on feet ? ?Chronic left-sided low back pain with left-sided sciatica ? ?Other abnormalities of gait and mobility ? ? ? ? ?Problem List ?There are no problems to display for this patient. ? ? ?ERennie Natter PT, DPT  ?05/17/2021, 4:29 PM ? ?[place] ?[facility] [place] ?[address]?[place] [address]?Phone: [phone]  Fax:  [phone]? ?Name: HJosuha Fontanez?MRN: [id number]?Date of Birth: [birth year]? ? ? ?

## 2021-05-20 ENCOUNTER — Ambulatory Visit: Payer: No Typology Code available for payment source

## 2021-05-20 DIAGNOSIS — G8929 Other chronic pain: Secondary | ICD-10-CM

## 2021-05-20 DIAGNOSIS — R2681 Unsteadiness on feet: Secondary | ICD-10-CM

## 2021-05-20 DIAGNOSIS — M6281 Muscle weakness (generalized): Secondary | ICD-10-CM

## 2021-05-20 DIAGNOSIS — M25562 Pain in left knee: Secondary | ICD-10-CM | POA: Diagnosis not present

## 2021-05-20 DIAGNOSIS — R262 Difficulty in walking, not elsewhere classified: Secondary | ICD-10-CM

## 2021-05-20 DIAGNOSIS — R2689 Other abnormalities of gait and mobility: Secondary | ICD-10-CM

## 2021-05-20 NOTE — Therapy (Signed)
[place] ?[facility] [place] ?[place] ?[place], [place], [address] ?Phone: [phone]   Fax:  [phone] ? ?Physical Therapy Treatment ? ?Patient Details  ?Name: Andrew Banks ?MRN: [id number] ?Date of Birth: 07/25/1950 ?Referring Provider (PT): Little, Traci PA-C ? ? ?Encounter Date: 05/20/2021 ? ? PT End of Session - 05/20/21 1504   ? ? Visit Number 14   ? Number of Visits 15   ? Date for PT Re-Evaluation 06/28/21   ? Authorization Type VA   ? Authorization Time Period 05/11/21-06/28/21   ? Authorization - Visit Number 2   ? Authorization - Number of Visits 16   ? Progress Note Due on Visit 20   ? PT Start Time 1405   ? PT Stop Time 6269   ? PT Time Calculation (min) 43 min   ? Activity Tolerance Patient tolerated treatment well   ? Behavior During Therapy [facility] for tasks assessed/performed   ? ?  ?  ? ?  ? ? ?Past Medical History:  ?Diagnosis Date  ? Arthritis   ? Degenerative disc disease, lumbar   ? Hypertension   ? Liver transplanted [facility])   ? Skin cancer   ? ? ?Past Surgical History:  ?Procedure Laterality Date  ? liver transplant    ? ? ?There were no vitals filed for this visit. ? ? Subjective Assessment - 05/20/21 1412   ? ? Subjective Pt reports that he feels more stable and stronger with walking.   ? Pertinent History s/p ORIF Pelvic fx 11/12/19;  s/p IMN L distal femur 08/12/2019;  s/p L IM nailing femur 11/02/20;  L foot dropskin CA, liver transplant, HTN, lumbar DDD   ? Diagnostic tests XR 12/22/20: 1.  Healing unstable traumatic U-shaped sacral fracture (spinopelvic dissociation) status post internal fixation with bilateral iliosacral screws at S1 and A left transsacral transiliac screw at S2. Both hips are located. No hardware complication. No gross change in alignment.   2.  Healed left ischiopubic ramus fracture.   3.  Healing peri-implant pertrochanteric left femoral fracture status post cephalomedullary nail fixation. No hardware is intact without  loosening. No change in fracture alignment.   4.  Healed bicondylar distal femoral fracture (extending proximally to the distal third diaphysis) status post ORIF with lateral buttress plate and screws. No hardware complication or change in fracture alignment.   5.  Medial patellar fracture is is not visible on this examination and seen on the CT left knee dated 11/11/2019.   6.  Healed nondisplaced fibular neck fracture.   7.  No new fractures.   8.  No joint misalignment (both sacroiliac, pubic symphysis, and both hips, and left knee).   9.  Status post embolization of multiple arteries (left L3 and L4 lumbar, right L4 lumbar, 2 branches of the left deep circumflex iliac artery, and bilateral iliolumbar arteries (per Interventional Radiology procedure note dated 11/10/2020) with multiple embolization coils in and around the anatomic pelvis. Vascular plug in the right groin.   10.  Status post ACL reconstruction. Femoral interference screw and partially imaged tibial screw are intact without loosening.   11.  Osteopenia.   12.  Moderate degenerative changes of the knee.   13.  Mild degenerative changes of the hips and pubic symphysis.   14.  Degenerative changes of the partially imaged lumbosacral spine.   15.  Partially imaged mesh tacks in the abdomen (along the ventral abdominal wall when correlating with prior CT  abdomen/pelvis dated 11/16/2019).   ? Patient Stated Goals improve LLE strength, improve L knee ROM, not fall again   ? Currently in Pain? Yes   ? Pain Score 5    ? Pain Location Hip   ? Pain Orientation Left   ? Pain Descriptors / Indicators Aching   ? Pain Type Acute pain   ? ?  ?  ? ?  ? ? ? ? ? ? ? ? ? ? ? ? ? ? ? ? ? ? ? ? [place] Adult PT Treatment/Exercise - 05/20/21 0001   ? ?  ? Knee/Hip Exercises: Aerobic  ? Nustep L6x34mn   ?  ? Knee/Hip Exercises: Machines for Strengthening  ? Cybex Knee Extension 25# 2x15 reps   ? Cybex Knee Flexion 25# 2x15 reps   ?  ? Knee/Hip Exercises: Standing  ? Lateral  Step Up Right;Left;10 reps;Step Height: 4";Hand Hold: 2   ? Forward Step Up Right;Left;10 reps;Hand Hold: 2;Step Height: 6"   ? ?  ?  ? ?  ? ? ? ? ? ? Balance Exercises - 05/20/21 0001   ? ?  ? Balance Exercises: Standing  ? Lift / Chop Both;10 reps   D2 flexion with yellow weight ball; R/L  ? Other Standing Exercises pallof press feet together x 10; hip thrust  x 10 - all with yellow weight ball   ? ?  ?  ? ?  ? ? ? ? ? ? ? PT Short Term Goals - 04/06/21 1733   ? ?  ? PT SHORT TERM GOAL #1  ? Title Patient to be independent with initial HEP.   ? Time 2   ? Period Weeks   ? Status Achieved   04/06/21- reports good compliance and daily performance.  ? Target Date 03/30/21   ? ?  ?  ? ?  ? ? ? ? PT Long Term Goals - 05/03/21 1419   ? ?  ? PT LONG TERM GOAL #1  ? Title Patient to be independent with advanced HEP.   ? Time 8   ? Period Weeks   ? Status On-going   05/03/21- met for current  ? Target Date 05/11/21   ?  ? PT LONG TERM GOAL #2  ? Title Patient will demonstrate improved endurance by completing 900' in 6MWT to access community.   ? Baseline 375' with 2 seated rest breaks needed, SPC used   ? Time 8   ? Period Weeks   ? Status On-going   05/03/21- 750' in 6 minutes with 4WRW, was able to complete 900' without rest break today.  ? Target Date 06/28/21   ?  ? PT LONG TERM GOAL #3  ? Title Patient will score >19/24 on DGI to decrease risk of falls.   ? Baseline NT today   ? Time 8   ? Period Weeks   ? Status On-going   04/22/21- 15/24  ? Target Date 06/28/21   ?  ? PT LONG TERM GOAL #4  ? Title Pt. will report 50% improvement in LLE pain.   ? Time 8   ? Period Weeks   ? Status On-going   05/03/21- no significant change.  ? Target Date 06/28/21   ?  ? PT LONG TERM GOAL #5  ? Title Patient to score atleast 45/56 on BERG in order to decrease risk of falls.   ? Time 8   ? Period Weeks   ? Status Achieved  05/03/21- 47/56  ? Target Date 05/11/21   ? ?  ?  ? ?  ? ? ? ? ? ? ? ? Plan - 05/20/21 1505   ? ? Clinical Impression  Statement Pt continues to note more strength and stability with everyday activities now. He still has more difficulty with L LE step ups. Also progressed WB exercises w/o UE support, pt was nervous about this but as we continued with these exercises he became more confident. CGA was given during all unsupported WB exercises. Unsteadiness was noted with the lift and chop and hip thrust but no LOB. Pt responded well to treatment.   ? Personal Factors and Comorbidities Comorbidity 3+   ? Comorbidities skin CA, liver transplant, HTN, lumbar DDD   ? PT Frequency 2x / week   ? PT Duration 8 weeks   ? PT Treatment/Interventions ADLs/Self Care Home Management;Gait training;Stair training;Functional mobility training;Therapeutic activities;Therapeutic exercise;Balance training;Neuromuscular re-education;Patient/family education;Manual techniques;Iontophoresis 51m/ml Dexamethasone;Moist Heat;Cryotherapy;Passive range of motion;Dry needling;Joint Manipulations;Aquatic Therapy   ? PT Next Visit Plan continue balance and LE strengthening, try more standing exercises w/o UE support   ? Consulted and Agree with Plan of Care Patient   ? ?  ?  ? ?  ? ? ?Patient will benefit from skilled therapeutic intervention in order to improve the following deficits and impairments:  Abnormal gait, Decreased activity tolerance, Decreased endurance, Decreased range of motion, Decreased strength, Impaired sensation, Pain, Decreased balance, Decreased mobility, Decreased safety awareness, Difficulty walking, Increased muscle spasms ? ?Visit Diagnosis: ?Chronic pain of left knee ? ?Muscle weakness (generalized) ? ?Difficulty in walking, not elsewhere classified ? ?Unsteadiness on feet ? ?Chronic left-sided low back pain with left-sided sciatica ? ?Other abnormalities of gait and mobility ? ? ? ? ?Problem List ?There are no problems to display for this patient. ? ? ?BArtist Pais PTA ?05/20/2021, 3:16 PM ? ?[place] ?[facility] [place] ?[address]?[place] [address]?Phone: [phone]  Fax:  [phone]? ?Name: HKenyatta Keidel?MRN: [id number]?Date of Birth: [birth year]? ? ? ?

## 2021-05-24 ENCOUNTER — Encounter: Payer: Self-pay | Admitting: Physical Therapy

## 2021-05-24 ENCOUNTER — Ambulatory Visit: Payer: No Typology Code available for payment source | Admitting: Physical Therapy

## 2021-05-24 DIAGNOSIS — G8929 Other chronic pain: Secondary | ICD-10-CM

## 2021-05-24 DIAGNOSIS — R2689 Other abnormalities of gait and mobility: Secondary | ICD-10-CM

## 2021-05-24 DIAGNOSIS — R262 Difficulty in walking, not elsewhere classified: Secondary | ICD-10-CM

## 2021-05-24 DIAGNOSIS — R2681 Unsteadiness on feet: Secondary | ICD-10-CM

## 2021-05-24 DIAGNOSIS — M6281 Muscle weakness (generalized): Secondary | ICD-10-CM

## 2021-05-24 DIAGNOSIS — M25562 Pain in left knee: Secondary | ICD-10-CM | POA: Diagnosis not present

## 2021-05-24 NOTE — Therapy (Signed)
[place] ?[facility] ?[address] ?[place], [place], [address] ?Phone: [phone]   Fax:  [phone] ? ?Physical Therapy Treatment ? ?Patient Details  ?Name: Andrew Banks ?MRN: [id number] ?Date of Birth: [birth year] ?Referring Provider (PT): Little, Traci PA-C ? ? ?Encounter Date: 05/24/2021 ? ? PT End of Session - 05/24/21 1455   ? ? Visit Number 15   ? Number of Visits 30   ? Date for PT Re-Evaluation 06/28/21   ? Authorization Type [facility]   ? Authorization Time Period 05/11/21-06/28/21   ? Authorization - Visit Number 3   ? Authorization - Number of Visits 16   ? Progress Note Due on Visit 20   ? PT Start Time 1450   ? PT Stop Time 1530   ? PT Time Calculation (min) 40 min   ? Activity Tolerance Patient tolerated treatment well   ? Behavior During Therapy [facility] for tasks assessed/performed   ? ?  ?  ? ?  ? ? ?Past Medical History:  ?Diagnosis Date  ? Arthritis   ? Degenerative disc disease, lumbar   ? Hypertension   ? Liver transplanted [facility])   ? Skin cancer   ? ? ?Past Surgical History:  ?Procedure Laterality Date  ? liver transplant    ? ? ?There were no vitals filed for this visit. ? ? Subjective Assessment - 05/24/21 1452   ? ? Subjective Pt reports he is doing well today.   ? Pertinent History s/p ORIF Pelvic fx 11/12/19;  s/p IMN L distal femur 08/12/2019;  s/p L IM nailing femur 11/02/20;  L foot dropskin CA, liver transplant, HTN, lumbar DDD   ? Diagnostic tests XR 12/22/20: 1.  Healing unstable traumatic U-shaped sacral fracture (spinopelvic dissociation) status post internal fixation with bilateral iliosacral screws at S1 and A left transsacral transiliac screw at S2. Both hips are located. No hardware complication. No gross change in alignment.   2.  Healed left ischiopubic ramus fracture.   3.  Healing peri-implant pertrochanteric left femoral fracture status post cephalomedullary nail fixation. No hardware is intact without loosening. No change in  fracture alignment.   4.  Healed bicondylar distal femoral fracture (extending proximally to the distal third diaphysis) status post ORIF with lateral buttress plate and screws. No hardware complication or change in fracture alignment.   5.  Medial patellar fracture is is not visible on this examination and seen on the CT left knee dated 11/11/2019.   6.  Healed nondisplaced fibular neck fracture.   7.  No new fractures.   8.  No joint misalignment (both sacroiliac, pubic symphysis, and both hips, and left knee).   9.  Status post embolization of multiple arteries (left L3 and L4 lumbar, right L4 lumbar, 2 branches of the left deep circumflex iliac artery, and bilateral iliolumbar arteries (per Interventional Radiology procedure note dated 11/10/2020) with multiple embolization coils in and around the anatomic pelvis. Vascular plug in the right groin.   10.  Status post ACL reconstruction. Femoral interference screw and partially imaged tibial screw are intact without loosening.   11.  Osteopenia.   12.  Moderate degenerative changes of the knee.   13.  Mild degenerative changes of the hips and pubic symphysis.   14.  Degenerative changes of the partially imaged lumbosacral spine.   15.  Partially imaged mesh tacks in the abdomen (along the ventral abdominal wall when correlating with prior CT abdomen/pelvis dated 11/16/2019).   ?  Patient Stated Goals improve LLE strength, improve L knee ROM, not fall again   ? Currently in Pain? Yes   ? Pain Score 5    ? Pain Location Hip   and knee  ? Pain Orientation Left   ? ?  ?  ? ?  ? ? ? ? ? ? ? ? ? ? ? ? ? ? ? ? ? ? ? ? [place] Adult PT Treatment/Exercise - 05/24/21 0001   ? ?  ? Knee/Hip Exercises: Aerobic  ? Nustep L6x 50mn   ?  ? Knee/Hip Exercises: Machines for Strengthening  ? Cybex Knee Extension 25# 2x15 reps   ? Cybex Knee Flexion 25# 2x15 reps   ?  ? Knee/Hip Exercises: Standing  ? Functional Squat 2 sets;10 reps   ? Functional Squat Limitations with overhead press  weighted yellow ball 2000g   ? Other Standing Knee Exercises RDLs with yellow 2kg ball 2 x 10   ?  ? Knee/Hip Exercises: Seated  ? Knee/Hip Flexion stepping over and back weight on floor 2 x 10 bil   ? ?  ?  ? ?  ? ? ? ? ? ? Balance Exercises - 05/24/21 0001   ? ?  ? Balance Exercises: Standing  ? Standing, One Foot on a Step Eyes open;Trunk rotation   ? Standing, One Foot on a Step Limitations L foot on stool with 2kg ball, 2 x 10 L foot on stool, 2 x 10 R foot on 2" step.   ? ?  ?  ? ?  ? ? ? ? ? ? ? PT Short Term Goals - 04/06/21 1733   ? ?  ? PT SHORT TERM GOAL #1  ? Title Patient to be independent with initial HEP.   ? Time 2   ? Period Weeks   ? Status Achieved   04/06/21- reports good compliance and daily performance.  ? Target Date 03/30/21   ? ?  ?  ? ?  ? ? ? ? PT Long Term Goals - 05/03/21 1419   ? ?  ? PT LONG TERM GOAL #1  ? Title Patient to be independent with advanced HEP.   ? Time 8   ? Period Weeks   ? Status On-going   05/03/21- met for current  ? Target Date 05/11/21   ?  ? PT LONG TERM GOAL #2  ? Title Patient will demonstrate improved endurance by completing 900' in 6MWT to access community.   ? Baseline 375' with 2 seated rest breaks needed, SPC used   ? Time 8   ? Period Weeks   ? Status On-going   05/03/21- 750' in 6 minutes with 4WRW, was able to complete 900' without rest break today.  ? Target Date 06/28/21   ?  ? PT LONG TERM GOAL #3  ? Title Patient will score >19/24 on DGI to decrease risk of falls.   ? Baseline NT today   ? Time 8   ? Period Weeks   ? Status On-going   04/22/21- 15/24  ? Target Date 06/28/21   ?  ? PT LONG TERM GOAL #4  ? Title Pt. will report 50% improvement in LLE pain.   ? Time 8   ? Period Weeks   ? Status On-going   05/03/21- no significant change.  ? Target Date 06/28/21   ?  ? PT LONG TERM GOAL #5  ? Title Patient to score atleast 45/56 on BERG  in order to decrease risk of falls.   ? Time 8   ? Period Weeks   ? Status Achieved   05/03/21- 47/56  ? Target Date 05/11/21    ? ?  ?  ? ?  ? ? ? ? ? ? ? ? Plan - 05/24/21 1629   ? ? Clinical Impression Statement Continued working on strength and balance today, adding more resistance with squats and RDLs, reporting some increased discomfort in low back.  Challenged with sitting hip flexion/ADD/ABD exercise today due to L hip pain.  Pt. would benefit from continued skilled therapy.   ? Personal Factors and Comorbidities Comorbidity 3+   ? Comorbidities skin CA, liver transplant, HTN, lumbar DDD   ? PT Frequency 2x / week   ? PT Duration 8 weeks   ? PT Treatment/Interventions ADLs/Self Care Home Management;Gait training;Stair training;Functional mobility training;Therapeutic activities;Therapeutic exercise;Balance training;Neuromuscular re-education;Patient/family education;Manual techniques;Iontophoresis 66m/ml Dexamethasone;Moist Heat;Cryotherapy;Passive range of motion;Dry needling;Joint Manipulations;Aquatic Therapy   ? PT Next Visit Plan continue balance and LE strengthening, try more standing exercises w/o UE support   ? Consulted and Agree with Plan of Care Patient   ? ?  ?  ? ?  ? ? ?Patient will benefit from skilled therapeutic intervention in order to improve the following deficits and impairments:  Abnormal gait, Decreased activity tolerance, Decreased endurance, Decreased range of motion, Decreased strength, Impaired sensation, Pain, Decreased balance, Decreased mobility, Decreased safety awareness, Difficulty walking, Increased muscle spasms ? ?Visit Diagnosis: ?Chronic pain of left knee ? ?Muscle weakness (generalized) ? ?Difficulty in walking, not elsewhere classified ? ?Unsteadiness on feet ? ?Chronic left-sided low back pain with left-sided sciatica ? ?Other abnormalities of gait and mobility ? ? ? ? ?Problem List ?There are no problems to display for this patient. ? ? ?ERennie Natter PT, DPT  ?05/24/2021, 4:31 PM ? ?[place] ?[facility] ?[address]?[place] [address]?Phone: [phone]  Fax:  [phone]? ?Name: HRutherford Alarie?MRN: [id number]?Date of Birth: 223-Dec-1952? ? ? ?

## 2021-05-31 ENCOUNTER — Ambulatory Visit: Payer: No Typology Code available for payment source | Attending: Emergency Medicine

## 2021-05-31 DIAGNOSIS — R2681 Unsteadiness on feet: Secondary | ICD-10-CM | POA: Insufficient documentation

## 2021-05-31 DIAGNOSIS — R2689 Other abnormalities of gait and mobility: Secondary | ICD-10-CM | POA: Diagnosis present

## 2021-05-31 DIAGNOSIS — G8929 Other chronic pain: Secondary | ICD-10-CM | POA: Diagnosis present

## 2021-05-31 DIAGNOSIS — M25562 Pain in left knee: Secondary | ICD-10-CM | POA: Diagnosis not present

## 2021-05-31 DIAGNOSIS — M6281 Muscle weakness (generalized): Secondary | ICD-10-CM | POA: Diagnosis present

## 2021-05-31 DIAGNOSIS — M5442 Lumbago with sciatica, left side: Secondary | ICD-10-CM | POA: Diagnosis present

## 2021-05-31 DIAGNOSIS — R262 Difficulty in walking, not elsewhere classified: Secondary | ICD-10-CM | POA: Diagnosis present

## 2021-05-31 NOTE — Therapy (Signed)
[place] ?[facility] ?[address] ?[place], [place], [address] ?Phone: [phone]   Fax:  [phone] ? ?Physical Therapy Treatment ? ?Patient Details  ?Name: Andrew Banks ?MRN: [id number] ?Date of Birth: 1950/11/06 ?Referring Provider (PT): Little, Traci PA-C ? ? ?Encounter Date: 05/31/2021 ? ? PT End of Session - 05/31/21 1618   ? ? Visit Number 16   ? Number of Visits 30   ? Date for PT Re-Evaluation 06/28/21   ? Authorization Type [facility]   ? Authorization Time Period 05/11/21-06/28/21   ? Authorization - Visit Number 4   ? Authorization - Number of Visits 16   ? Progress Note Due on Visit 20   ? PT Start Time 0175   pt late  ? PT Stop Time 1025   ? PT Time Calculation (min) 39 min   ? Activity Tolerance Patient tolerated treatment well   ? Behavior During Therapy [facility] for tasks assessed/performed   ? ?  ?  ? ?  ? ? ?Past Medical History:  ?Diagnosis Date  ? Arthritis   ? Degenerative disc disease, lumbar   ? Hypertension   ? Liver transplanted [facility])   ? Skin cancer   ? ? ?Past Surgical History:  ?Procedure Laterality Date  ? liver transplant    ? ? ?There were no vitals filed for this visit. ? ? Subjective Assessment - 05/31/21 1537   ? ? Subjective Pt notes no new falls, doing well.   ? Pertinent History s/p ORIF Pelvic fx 11/12/19;  s/p IMN L distal femur 08/12/2019;  s/p L IM nailing femur 11/02/20;  L foot dropskin CA, liver transplant, HTN, lumbar DDD   ? Diagnostic tests XR 12/22/20: 1.  Healing unstable traumatic U-shaped sacral fracture (spinopelvic dissociation) status post internal fixation with bilateral iliosacral screws at S1 and A left transsacral transiliac screw at S2. Both hips are located. No hardware complication. No gross change in alignment.   2.  Healed left ischiopubic ramus fracture.   3.  Healing peri-implant pertrochanteric left femoral fracture status post cephalomedullary nail fixation. No hardware is intact without loosening. No change  in fracture alignment.   4.  Healed bicondylar distal femoral fracture (extending proximally to the distal third diaphysis) status post ORIF with lateral buttress plate and screws. No hardware complication or change in fracture alignment.   5.  Medial patellar fracture is is not visible on this examination and seen on the CT left knee dated 11/11/2019.   6.  Healed nondisplaced fibular neck fracture.   7.  No new fractures.   8.  No joint misalignment (both sacroiliac, pubic symphysis, and both hips, and left knee).   9.  Status post embolization of multiple arteries (left L3 and L4 lumbar, right L4 lumbar, 2 branches of the left deep circumflex iliac artery, and bilateral iliolumbar arteries (per Interventional Radiology procedure note dated 11/10/2020) with multiple embolization coils in and around the anatomic pelvis. Vascular plug in the right groin.   10.  Status post ACL reconstruction. Femoral interference screw and partially imaged tibial screw are intact without loosening.   11.  Osteopenia.   12.  Moderate degenerative changes of the knee.   13.  Mild degenerative changes of the hips and pubic symphysis.   14.  Degenerative changes of the partially imaged lumbosacral spine.   15.  Partially imaged mesh tacks in the abdomen (along the ventral abdominal wall when correlating with prior CT abdomen/pelvis  dated 11/16/2019).   ? Patient Stated Goals improve LLE strength, improve L knee ROM, not fall again   ? Currently in Pain? Yes   ? Pain Score 5    ? Pain Location Hip   ? Pain Orientation Left   ? Pain Descriptors / Indicators Aching   ? Pain Type Acute pain;Chronic pain   ? Pain Score 5   ? Pain Location Knee   ? Pain Orientation Left   ? Pain Descriptors / Indicators Sharp   ? Pain Type Acute pain;Chronic pain   ? ?  ?  ? ?  ? ? ? ? ? ? ? ? ? ? ? ? ? ? ? ? ? ? ? ? [place] Adult PT Treatment/Exercise - 05/31/21 0001   ? ?  ? Ambulation/Gait  ? Ambulation Distance (Feet) 240 Feet   ? Assistive device Straight  cane   ? Gait Pattern Step-through pattern;Lateral trunk lean to right;Wide base of support;Decreased step length - left;Decreased stance time - right   ? Gait Comments very slow, shortened stride length 1 rest break required   ?  ? Knee/Hip Exercises: Aerobic  ? Nustep L7x20mn   ?  ? Knee/Hip Exercises: Machines for Strengthening  ? Cybex Knee Flexion 30# BLE x 15   ?  ? Knee/Hip Exercises: Seated  ? Clamshell with TheraBand Blue   20x3"  ? Marching Strengthening;Both;10 reps;2 sets   ? Marching Limitations blue TB   ? Sit to Sand 2 sets;10 reps   with yellow weighted ball  ? ?  ?  ? ?  ? ? ? ? ? ? ? ? ? ? ? ? PT Short Term Goals - 04/06/21 1733   ? ?  ? PT SHORT TERM GOAL #1  ? Title Patient to be independent with initial HEP.   ? Time 2   ? Period Weeks   ? Status Achieved   04/06/21- reports good compliance and daily performance.  ? Target Date 03/30/21   ? ?  ?  ? ?  ? ? ? ? PT Long Term Goals - 05/31/21 1620   ? ?  ? PT LONG TERM GOAL #1  ? Title Patient to be independent with advanced HEP.   ? Time 8   ? Period Weeks   ? Status On-going   05/03/21- met for current  ? Target Date 05/11/21   ?  ? PT LONG TERM GOAL #2  ? Title Patient will demonstrate improved endurance by completing 900' in 6MWT to access community.   ? Baseline 375' with 2 seated rest breaks needed, SPC used   ? Time 8   ? Period Weeks   ? Status On-going   05/03/21- 750' in 6 minutes with 4WRW, was able to complete 900' without rest break today.  ? Target Date 06/28/21   ?  ? PT LONG TERM GOAL #3  ? Title Patient will score >19/24 on DGI to decrease risk of falls.   ? Baseline NT today   ? Time 8   ? Period Weeks   ? Status On-going   04/22/21- 15/24  ? Target Date 06/28/21   ?  ? PT LONG TERM GOAL #4  ? Title Pt. will report 50% improvement in LLE pain.   ? Time 8   ? Period Weeks   ? Status Partially Met   05/31/21- pt reports 90% improvement  ? Target Date 06/28/21   ?  ? PT LONG TERM GOAL #  5  ? Title Patient to score atleast 45/56 on BERG in order  to decrease risk of falls.   ? Time 8   ? Period Weeks   ? Status Achieved   05/03/21- 47/56  ? Target Date 05/11/21   ? ?  ?  ? ?  ? ? ? ? ? ? ? ? Plan - 05/31/21 1628   ? ? Clinical Impression Statement Pt today was limited by some fatigue from increased activity over the weekend. Progressed gait with LRAD to allow for normal gait mechanics, cues given to increase stride length. Mostly did seated TE due to fatigue noted today. Pt was able to complete all interventions today w/o any increased pain.   ? Personal Factors and Comorbidities Comorbidity 3+   ? Comorbidities skin CA, liver transplant, HTN, lumbar DDD   ? PT Frequency 2x / week   ? PT Duration 8 weeks   ? PT Treatment/Interventions ADLs/Self Care Home Management;Gait training;Stair training;Functional mobility training;Therapeutic activities;Therapeutic exercise;Balance training;Neuromuscular re-education;Patient/family education;Manual techniques;Iontophoresis 28m/ml Dexamethasone;Moist Heat;Cryotherapy;Passive range of motion;Dry needling;Joint Manipulations;Aquatic Therapy   ? PT Next Visit Plan continue balance and LE strengthening, try more standing exercises w/o UE support   ? Consulted and Agree with Plan of Care Patient   ? ?  ?  ? ?  ? ? ?Patient will benefit from skilled therapeutic intervention in order to improve the following deficits and impairments:  Abnormal gait, Decreased activity tolerance, Decreased endurance, Decreased range of motion, Decreased strength, Impaired sensation, Pain, Decreased balance, Decreased mobility, Decreased safety awareness, Difficulty walking, Increased muscle spasms ? ?Visit Diagnosis: ?Chronic pain of left knee ? ?Muscle weakness (generalized) ? ?Difficulty in walking, not elsewhere classified ? ?Unsteadiness on feet ? ?Chronic left-sided low back pain with left-sided sciatica ? ?Other abnormalities of gait and mobility ? ? ? ? ?Problem List ?There are no problems to display for this patient. ? ? ?BArtist Pais PTA ?05/31/2021, 6:27 PM ? ? ?[facility] ?[address]?[place] [address]?Phone: [phone]  Fax:  [phone]? ?Name: H

## 2021-06-02 ENCOUNTER — Encounter: Payer: Self-pay | Admitting: Physical Therapy

## 2021-06-02 ENCOUNTER — Ambulatory Visit: Payer: No Typology Code available for payment source | Admitting: Physical Therapy

## 2021-06-02 DIAGNOSIS — R2681 Unsteadiness on feet: Secondary | ICD-10-CM

## 2021-06-02 DIAGNOSIS — M6281 Muscle weakness (generalized): Secondary | ICD-10-CM

## 2021-06-02 DIAGNOSIS — R262 Difficulty in walking, not elsewhere classified: Secondary | ICD-10-CM

## 2021-06-02 DIAGNOSIS — G8929 Other chronic pain: Secondary | ICD-10-CM

## 2021-06-02 DIAGNOSIS — R2689 Other abnormalities of gait and mobility: Secondary | ICD-10-CM

## 2021-06-02 DIAGNOSIS — M25562 Pain in left knee: Secondary | ICD-10-CM | POA: Diagnosis not present

## 2021-06-02 NOTE — Therapy (Signed)
[place] ?[facility] ?[place] ?[place], [place], [address] ?Phone: [phone]   Fax:  [phone] ? ?Physical Therapy Treatment ? ?Patient Details  ?Name: Andrew Banks ?MRN: [id number] ?Date of Birth: [birth year] ?Referring Provider (PT): Little, Traci PA-C ? ? ?Encounter Date: 06/02/2021 ? ? PT End of Session - 06/02/21 1452   ? ? Visit Number 17   ? Number of Visits 30   ? Date for PT Re-Evaluation 06/28/21   ? Authorization Type [facility]   ? Authorization Time Period 05/11/21-06/28/21   ? Authorization - Visit Number 5   ? Authorization - Number of Visits 16   ? Progress Note Due on Visit 20   ? PT Start Time 5885   ? PT Stop Time 1530   ? PT Time Calculation (min) 41 min   ? Activity Tolerance Patient tolerated treatment well   ? Behavior During Therapy [facility] for tasks assessed/performed   ? ?  ?  ? ?  ? ? ?Past Medical History:  ?Diagnosis Date  ? Arthritis   ? Degenerative disc disease, lumbar   ? Hypertension   ? Liver transplanted [facility])   ? Skin cancer   ? ? ?Past Surgical History:  ?Procedure Laterality Date  ? liver transplant    ? ? ?There were no vitals filed for this visit. ? ? Subjective Assessment - 06/02/21 1451   ? ? Subjective Patient feels worse than ever, also having trouble with hepatic encephalopathy, has contacted dr.   ? Pertinent History s/p ORIF Pelvic fx 11/12/19;  s/p IMN L distal femur 08/12/2019;  s/p L IM nailing femur 11/02/20;  L foot dropskin CA, liver transplant, HTN, lumbar DDD   ? Diagnostic tests XR 12/22/20: 1.  Healing unstable traumatic U-shaped sacral fracture (spinopelvic dissociation) status post internal fixation with bilateral iliosacral screws at S1 and A left transsacral transiliac screw at S2. Both hips are located. No hardware complication. No gross change in alignment.   2.  Healed left ischiopubic ramus fracture.   3.  Healing peri-implant pertrochanteric left femoral fracture status post cephalomedullary nail  fixation. No hardware is intact without loosening. No change in fracture alignment.   4.  Healed bicondylar distal femoral fracture (extending proximally to the distal third diaphysis) status post ORIF with lateral buttress plate and screws. No hardware complication or change in fracture alignment.   5.  Medial patellar fracture is is not visible on this examination and seen on the CT left knee dated 11/11/2019.   6.  Healed nondisplaced fibular neck fracture.   7.  No new fractures.   8.  No joint misalignment (both sacroiliac, pubic symphysis, and both hips, and left knee).   9.  Status post embolization of multiple arteries (left L3 and L4 lumbar, right L4 lumbar, 2 branches of the left deep circumflex iliac artery, and bilateral iliolumbar arteries (per Interventional Radiology procedure note dated 11/10/2020) with multiple embolization coils in and around the anatomic pelvis. Vascular plug in the right groin.   10.  Status post ACL reconstruction. Femoral interference screw and partially imaged tibial screw are intact without loosening.   11.  Osteopenia.   12.  Moderate degenerative changes of the knee.   13.  Mild degenerative changes of the hips and pubic symphysis.   14.  Degenerative changes of the partially imaged lumbosacral spine.   15.  Partially imaged mesh tacks in the abdomen (along the ventral abdominal wall when correlating  with prior CT abdomen/pelvis dated 11/16/2019).   ? Patient Stated Goals improve LLE strength, improve L knee ROM, not fall again   ? Currently in Pain? Yes   ? Pain Score 4    ? Pain Location Hip   ? Pain Orientation Left   ? ?  ?  ? ?  ? ? ? ? ? ? ? ? ? ? ? ? ? ? ? ? ? ? ? ? [place] Adult PT Treatment/Exercise - 06/02/21 0001   ? ?  ? Knee/Hip Exercises: Aerobic  ? Nustep [id number]   ?  ? Knee/Hip Exercises: Machines for Strengthening  ? Cybex Knee Extension 30# 2 x 10   ? Cybex Knee Flexion 30# bil, 2 x 10   ? Other Machine rows 35# 2 x 10   ? ?  ?  ? ?  ? ? ? ? ? ? Balance  Exercises - 06/02/21 0001   ? ?  ? Balance Exercises: Standing  ? Standing Eyes Opened Head turns;Foam/compliant surface;Limitations   ? Standing Eyes Opened Limitations on airex in corner for safety with SBA, standing x 30 sec, head nods x 10, head turns x 10.   ? Standing Eyes Closed Foam/compliant surface;Limitations   ? Standing Eyes Closed Limitations standing x 1 min before needed to sit due to LBP   ? Other Standing Exercises star excursion pattern x 4 each foot (180deg foward to back) with SPC and CGA for safety.   ? ?  ?  ? ?  ? ? ? ? ? ? ? PT Short Term Goals - 04/06/21 1733   ? ?  ? PT SHORT TERM GOAL #1  ? Title Patient to be independent with initial HEP.   ? Time 2   ? Period Weeks   ? Status Achieved   04/06/21- reports good compliance and daily performance.  ? Target Date 03/30/21   ? ?  ?  ? ?  ? ? ? ? PT Long Term Goals - 05/31/21 1620   ? ?  ? PT LONG TERM GOAL #1  ? Title Patient to be independent with advanced HEP.   ? Time 8   ? Period Weeks   ? Status On-going   05/03/21- met for current  ? Target Date 05/11/21   ?  ? PT LONG TERM GOAL #2  ? Title Patient will demonstrate improved endurance by completing 900' in 6MWT to access community.   ? Baseline 375' with 2 seated rest breaks needed, SPC used   ? Time 8   ? Period Weeks   ? Status On-going   05/03/21- 750' in 6 minutes with 4WRW, was able to complete 900' without rest break today.  ? Target Date 06/28/21   ?  ? PT LONG TERM GOAL #3  ? Title Patient will score >19/24 on DGI to decrease risk of falls.   ? Baseline NT today   ? Time 8   ? Period Weeks   ? Status On-going   04/22/21- 15/24  ? Target Date 06/28/21   ?  ? PT LONG TERM GOAL #4  ? Title Pt. will report 50% improvement in LLE pain.   ? Time 8   ? Period Weeks   ? Status Partially Met   05/31/21- pt reports 90% improvement  ? Target Date 06/28/21   ?  ? PT LONG TERM GOAL #5  ? Title Patient to score atleast 45/56 on BERG in order to decrease risk  of falls.   ? Time 8   ? Period Weeks   ?  Status Achieved   05/03/21- 47/56  ? Target Date 05/11/21   ? ?  ?  ? ?  ? ? ? ? ? ? ? ? Plan - 06/02/21 1537   ? ? Clinical Impression Statement Mr. Laurent demonstrated significant decrease in exercise tolerance today, fatiguing very quickly and needing more seated rest breaks.  Focused skilled interventions on continuing LE strength and balance.  He would benefit from continued skilled therapy.   ? Personal Factors and Comorbidities Comorbidity 3+   ? Comorbidities skin CA, liver transplant, HTN, lumbar DDD   ? PT Frequency 2x / week   ? PT Duration 8 weeks   ? PT Treatment/Interventions ADLs/Self Care Home Management;Gait training;Stair training;Functional mobility training;Therapeutic activities;Therapeutic exercise;Balance training;Neuromuscular re-education;Patient/family education;Manual techniques;Iontophoresis 45m/ml Dexamethasone;Moist Heat;Cryotherapy;Passive range of motion;Dry needling;Joint Manipulations;Aquatic Therapy   ? PT Next Visit Plan continue balance and LE strengthening, try more standing exercises w/o UE support   ? Consulted and Agree with Plan of Care Patient   ? ?  ?  ? ?  ? ? ?Patient will benefit from skilled therapeutic intervention in order to improve the following deficits and impairments:  Abnormal gait, Decreased activity tolerance, Decreased endurance, Decreased range of motion, Decreased strength, Impaired sensation, Pain, Decreased balance, Decreased mobility, Decreased safety awareness, Difficulty walking, Increased muscle spasms ? ?Visit Diagnosis: ?Chronic pain of left knee ? ?Muscle weakness (generalized) ? ?Difficulty in walking, not elsewhere classified ? ?Unsteadiness on feet ? ?Chronic left-sided low back pain with left-sided sciatica ? ?Other abnormalities of gait and mobility ? ? ? ? ?Problem List ?There are no problems to display for this patient. ? ? ?ERennie Natter PT, DPT  ?06/02/2021, 4:18 PM ? ?[place] ?[facility] ?[address]?[place] [address]?Phone: [phone]  Fax:  [phone]? ?Name: HAlex Leahy?MRN: [id number]?Date of Birth: 2Sep 16, 1952? ? ? ?

## 2021-06-07 ENCOUNTER — Ambulatory Visit: Payer: No Typology Code available for payment source

## 2021-06-09 ENCOUNTER — Encounter: Payer: Self-pay | Admitting: Physical Therapy

## 2021-06-09 ENCOUNTER — Ambulatory Visit: Payer: No Typology Code available for payment source | Admitting: Physical Therapy

## 2021-06-09 DIAGNOSIS — R2681 Unsteadiness on feet: Secondary | ICD-10-CM

## 2021-06-09 DIAGNOSIS — R2689 Other abnormalities of gait and mobility: Secondary | ICD-10-CM

## 2021-06-09 DIAGNOSIS — M6281 Muscle weakness (generalized): Secondary | ICD-10-CM

## 2021-06-09 DIAGNOSIS — G8929 Other chronic pain: Secondary | ICD-10-CM

## 2021-06-09 DIAGNOSIS — R262 Difficulty in walking, not elsewhere classified: Secondary | ICD-10-CM

## 2021-06-09 DIAGNOSIS — M25562 Pain in left knee: Secondary | ICD-10-CM | POA: Diagnosis not present

## 2021-06-09 NOTE — Therapy (Signed)
?OUTPATIENT PHYSICAL THERAPY TREATMENT NOTE ? ? ?Patient Name: Andrew Banks ?MRN: [id number] ?DOB:1950/07/18, 71 y.o., male ?Today's Date: 06/09/2021 ? ?PCP: Felipa Eth, MD ?REFERRING PROVIDER: Jerolyn Shin, PAC ? ? PT End of Session - 06/09/21 1547   ? ? Visit Number 18   ? Number of Visits 30   ? Date for PT Re-Evaluation 06/28/21   ? Authorization Type [facility]   ? Authorization Time Period 05/11/21-06/28/21   ? Authorization - Visit Number 5   ? Authorization - Number of Visits 16   ? Progress Note Due on Visit 20   ? PT Start Time 1542   ? PT Stop Time 4098   ? PT Time Calculation (min) 35 min   ? Activity Tolerance Patient tolerated treatment well   ? Behavior During Therapy [facility] for tasks assessed/performed   ? ?  ?  ? ?  ? ? ?Past Medical History:  ?Diagnosis Date  ? Arthritis   ? Degenerative disc disease, lumbar   ? Hypertension   ? 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 Skin cancer   ? ?Past Surgical History:  ?Procedure Laterality Date  ? liver transplant    ? ?There are no problems to display for this patient. ? ? ?REFERRING DIAG: M79.606 (ICD-10-CM) - Leg pain  06/28/21 ? ?THERAPY DIAG:  ?Chronic pain of left knee ? ?Muscle weakness (generalized) ? ?Difficulty in walking, not elsewhere classified ? ?Unsteadiness on feet ? ?Chronic left-sided low back pain with left-sided sciatica ? ?Other abnormalities of gait and mobility ? ?PERTINENT HISTORY: s/p ORIF Pelvic fx 11/12/19 ?s/p IMN L distal femur 08/12/2019 ?s/p L IM nailing femur  11/02/20 ?L foot drop ? ?PRECAUTIONS: fall ? ?SUBJECTIVE: Pt. Reported that he has had a busy day, dishwasher broke and he's been crawling on the floor trying to fix, so L foot is hurting.  He reports he feels much stronger in hips and legs, would like to address foot today.  He will miss next few weeks due to MOHS surgery for skin cancer tomorrow.  ? ?PAIN:  ?Are you having pain? Yes: NPRS scale: 6/10 ?Pain location: L hip, leg, knee, whole bony portion of his L foot.  ? ?OBJECTIVE:  (objective measures completed at initial evaluation unless otherwise dated) ? Transfers                                                                       05/03/2021  ?  Five time sit to stand comments  11 seconds   ?     ?  6 minute walk test results   ?  Aerobic Endurance Distance Walked 750   ?  Endurance additional comments with 4WRW, no rest breaks, increased LLE pain   ?     ?  Balance  ?  Balance Assessed Yes   ?     ?  Standardized Balance Assessment  ?  Standardized Balance Assessment Berg Balance Test   ?  Five times sit to stand comments  11 seconds   ?     ?  Berg Balance Test  ?  Sit to Stand Able to stand without using hands and stabilize independently   ?  Standing Unsupported Able to stand safely 2  minutes   ?  Sitting with Back Unsupported but Feet Supported on Floor or Stool Able to sit safely and securely 2 minutes   ?  Stand to Sit Sits safely with minimal use of hands   ?  Transfers Able to transfer safely, minor use of hands   ?  Standing Unsupported with Eyes Closed Able to stand 10 seconds safely   ?  Standing Unsupported with Feet Together Able to place feet together independently and stand 1 minute safely   ?  From Standing, Reach Forward with Outstretched Arm Can reach forward >12 cm safely (5")   ?  From Standing Position, Pick up Object from Floor Able to pick up shoe, needs supervision   ?  From Standing Position, Turn to Look Behind Over each Shoulder Looks behind from both sides and weight shifts well   ?  Turn 360 Degrees Able to turn 360 degrees safely but slowly   ?  Standing Unsupported, Alternately Place Feet on Step/Stool Able to stand independently and complete 8 steps >20 seconds   ?  Standing Unsupported, One Foot in Front Able to plae foot ahead of the other independently and hold 30 seconds   ?  Standing on One Leg Tries to lift leg/unable to hold 3 seconds but remains standing independently   ?  Total Score 47   ? ? ? ?TODAY'S TREATMENT:  ?Therapeutic Exercise: to improve  strength and mobility.  Verbal and tactile cues throughout for technique. ?Nustep x 5 min Level 6 LE only for warm-up.  BAPS level 1 DF/PF x 20, Inv/Ever x 20 with minA to stabilize ankle.  Seated ankle eversion x 15 with tactile cues to fibularis muscles, abducting toes x 10 all to L ankle/foot.   ? ?Manual therapy to L foot to decrease pain and improve mobility.  Mobs to forefoot, MTT, STM to plantar fascia and gentle stretches. ? ? ?PATIENT EDUCATION: ?Education details: education on anatomy, location of peroneal nerve, damage to nerves and limitations with recovery.  ?Person educated: Patient ?Education method: Explanation ?Education comprehension: verbalized understanding ? ? ?HOME EXERCISE PROGRAM: ?Given prior ? ?ASSESSMENT: ?Clinical Impression: ?Colum Colt arrived 10 min late today.  He reports improved strength but concerned today about L ankle strength.  Educated today on innervation of fibularis muscles and weakness, and that these muscles may not recover from nerve damage.  He did demonstrate trace activation today with ankle eversion and toe spreading, but needed tactile cues and minA.  Reported significant decrease in L foot pain following manual therapy.  Would benefit from continued skilled therapy.  ? ? PT Short Term Goals - 04/06/21 1733   ? ?  ? PT SHORT TERM GOAL #1  ? Title Patient to be independent with initial HEP.   ? Time 2   ? Period Weeks   ? Status Achieved   04/06/21- reports good compliance and daily performance.  ? Target Date 03/30/21   ? ?  ?  ? ?  ? ? ? PT Long Term Goals - 05/31/21 1620   ? ?  ? PT LONG TERM GOAL #1  ? Title Patient to be independent with advanced HEP.   ? Time 8   ? Period Weeks   ? Status On-going   05/03/21- met for current  ? Target Date 05/11/21   ?  ? PT LONG TERM GOAL #2  ? Title Patient will demonstrate improved endurance by completing 900' in 6MWT to access community.   ?  Baseline 375' with 2 seated rest breaks needed, SPC used   ? Time 8   ? Period  Weeks   ? Status On-going   05/03/21- 750' in 6 minutes with 4WRW, was able to complete 900' without rest break today.  ? Target Date 06/28/21   ?  ? PT LONG TERM GOAL #3  ? Title Patient will score >19/24 on DGI to decrease risk of falls.   ? Baseline NT today   ? Time 8   ? Period Weeks   ? Status On-going   04/22/21- 15/24  ? Target Date 06/28/21   ?  ? PT LONG TERM GOAL #4  ? Title Pt. will report 50% improvement in LLE pain.   ? Time 8   ? Period Weeks   ? Status Partially Met   05/31/21- pt reports 90% improvement  ? Target Date 06/28/21   ?  ? PT LONG TERM GOAL #5  ? Title Patient to score atleast 45/56 on BERG in order to decrease risk of falls.   ? Time 8   ? Period Weeks   ? Status Achieved   05/03/21- 47/56  ? Target Date 05/11/21   ? ?  ?  ? ?  ? ?PLAN: ? ?PT Frequency 2x / week    ?  PT Duration 8 weeks   ?  PT Treatment/Interventions ADLs/Self Care Home Management;Gait training;Stair training;Functional mobility training;Therapeutic activities;Therapeutic exercise;Balance training;Neuromuscular re-education;Patient/family education;Manual techniques;Iontophoresis 55m/ml Dexamethasone;Moist Heat;Cryotherapy;Passive range of motion;Dry needling;Joint Manipulations;Aquatic Therapy   ?  PT Next Visit Plan Progress note, continue strengthening.   ?  ?   ?  ?  ?   ?  ?  ?Patient will benefit from skilled therapeutic intervention in order to improve the following deficits and impairments:  Abnormal gait, Decreased activity tolerance, Decreased endurance, Decreased range of motion, Decreased strength, Impaired sensation, Pain, Decreased balance, Decreased mobility, Decreased safety awareness, Difficulty walking, Increased muscle spasms ? ? ?ERennie Natter PT, DPT  ?06/09/2021, 6:54 PM ? ?  ? ?

## 2021-07-05 ENCOUNTER — Ambulatory Visit: Payer: No Typology Code available for payment source | Attending: Emergency Medicine | Admitting: Physical Therapy

## 2021-07-05 ENCOUNTER — Encounter: Payer: Self-pay | Admitting: Physical Therapy

## 2021-07-05 DIAGNOSIS — M6281 Muscle weakness (generalized): Secondary | ICD-10-CM | POA: Insufficient documentation

## 2021-07-05 DIAGNOSIS — R2681 Unsteadiness on feet: Secondary | ICD-10-CM | POA: Diagnosis present

## 2021-07-05 DIAGNOSIS — G8929 Other chronic pain: Secondary | ICD-10-CM | POA: Diagnosis present

## 2021-07-05 DIAGNOSIS — M5442 Lumbago with sciatica, left side: Secondary | ICD-10-CM | POA: Insufficient documentation

## 2021-07-05 DIAGNOSIS — R2689 Other abnormalities of gait and mobility: Secondary | ICD-10-CM | POA: Insufficient documentation

## 2021-07-05 DIAGNOSIS — M25562 Pain in left knee: Secondary | ICD-10-CM | POA: Diagnosis present

## 2021-07-05 DIAGNOSIS — R262 Difficulty in walking, not elsewhere classified: Secondary | ICD-10-CM | POA: Diagnosis present

## 2021-07-05 NOTE — Therapy (Signed)
OUTPATIENT PHYSICAL THERAPY TREATMENT NOTE/Progress Note  Progress Note Reporting Period 05/03/2021 to 07/05/2021  See note below for Objective Data and Assessment of Progress/Goals.      Patient Name: Andrew Banks MRN: [id number] DOB:January 06, 1951, 71 y.o., male Today's Date: 07/05/2021  PCP: Felipa Eth, MD REFERRING PROVIDER: Jerolyn Shin. PA-C   PT End of Session - 07/05/21 1534     Visit Number 19    Number of Visits 30    Date for PT Re-Evaluation 08/30/21    Authorization Type VA    Authorization Time Period 05/11/21-06/28/21    Authorization - Visit Number 6    Authorization - Number of Visits 16    Progress Note Due on Visit 72    PT Start Time 5183    PT Stop Time 1618    PT Time Calculation (min) 43 min    Activity Tolerance Patient tolerated treatment well    Behavior During Therapy WFL for tasks assessed/performed             Past Medical History:  Diagnosis Date   Arthritis    Degenerative disc disease, lumbar    Hypertension    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   REFERRING DIAG: M79.606 (ICD-10-CM) - Leg pain  THERAPY DIAG:  Chronic pain of left knee - Plan: PT plan of care cert/re-cert  Muscle weakness (generalized) - Plan: PT plan of care cert/re-cert  Difficulty in walking, not elsewhere classified - Plan: PT plan of care cert/re-cert  Unsteadiness on feet - Plan: PT plan of care cert/re-cert  Chronic left-sided low back pain with left-sided sciatica - Plan: PT plan of care cert/re-cert  Other abnormalities of gait and mobility - Plan: PT plan of care cert/re-cert  Rationale for Evaluation and Treatment Rehabilitation  PERTINENT HISTORY: s/p ORIF Pelvic fx 11/12/19 s/p IMN L distal femur 08/12/2019 s/p L IM nailing femur  11/02/20 L foot drop  PRECAUTIONS: fall  SUBJECTIVE: Pt. Reports that he  was out a couple of weeks recovering from all the skin cancer removals, took a lot more out of him than he thought it would.  Reports deep wound on R scapula.  He has also been having a lot of pain in his L foot with the AFO.   PAIN:  Are you having pain? Yes: NPRS scale: 6/10 Pain location: L foot, 5/10 L hip  OBJECTIVE: (objective measures completed at initial evaluation unless otherwise dated)         Transfers                                                                       05/03/2021 07/05/2021     Five time sit to stand comments  11 seconds  22 seconds              6 minute walk test results       Aerobic Endurance Distance Walked 750       Endurance additional comments with 4WRW, no rest breaks, increased LLE pain  600' with [id number]  Balance      Balance Assessed Yes                Standardized Balance Assessment      Standardized Balance Assessment Berg Balance Test  DGI 14/24 - high risk of falls.  Used SPC     Five times sit to stand comments  11 seconds                Berg Balance Test      Sit to Stand Able to stand without using hands and stabilize independently       Standing Unsupported Able to stand safely 2 minutes       Sitting with Back Unsupported but Feet Supported on Floor or Stool Able to sit safely and securely 2 minutes       Stand to Sit Sits safely with minimal use of hands       Transfers Able to transfer safely, minor use of hands       Standing Unsupported with Eyes Closed Able to stand 10 seconds safely       Standing Unsupported with Feet Together Able to place feet together independently and stand 1 minute safely       From Standing, Reach Forward with Outstretched Arm Can reach forward >12 cm safely (5")       From Standing Position, Pick up Object from Floor Able to pick up shoe, needs supervision       From Standing Position, Turn to Look Behind Over each Shoulder Looks behind from both sides and weight shifts well       Turn 360 Degrees  Able to turn 360 degrees safely but slowly       Standing Unsupported, Alternately Place Feet on Step/Stool Able to stand independently and complete 8 steps >20 seconds       Standing Unsupported, One Foot in Front Able to plae foot ahead of the other independently and hold 30 seconds       Standing on One Leg Tries to lift leg/unable to hold 3 seconds but remains standing independently       Total Score 47       TODAY'S TREATMENT:  07/05/2021  Physical Performance - 6MWT (600' with 4WRW, SBA), DGI, 5x STS- 22 seconds, no UE assist. Self Care- checking AFO, no redness, but did discuss return to orthotist Manual therapy - mobs to foot, ankle circles    PATIENT EDUCATION: Education details: reviewed HEP, increase sit to stands at home, contact [employer] to adjust AFO.  Person educated: Patient Education method: Explanation, Demonstration, and Handouts Education comprehension: verbalized understanding and returned demonstration   HOME EXERCISE PROGRAM: Given prior  ASSESSMENT: Clinical Impression: Patient returns today after 3 week absence due to MOHS surgery to remove skin cancers - 4 different locations.  He demonstrates significant loss of endurance and strength during absence from PT.  His 5x STS time is significantly worse than on initial evaluation, and he was reporting more LLE pain.  He is seeing spine doctor soon about back, which may be related to increased LLE pain.  He did not demonstrate any redness or signs of pressure in L foot from AFO, but due to complaint of pain recommended return to orthotist to see if could be adjusted to decrease pain.   Recommended perform STS frequently at home to improve LE strength and endurance as well.  He would benefit from continued skilled therapy to improve strength, balance, and endurance and decrease risk  of more falls with injury, extending current plan 2x/week for additional 8 weeks to 08/30/2021.    PT Short Term Goals - 04/06/21 1733        PT SHORT TERM GOAL #1   Title Patient to be independent with initial HEP.    Time 2    Period Weeks    Status Achieved   04/06/21- reports good compliance and daily performance.   Target Date 03/30/21              PT Long Term Goals - 07/05/21 1557       PT LONG TERM GOAL #1   Title Patient to be independent with advanced HEP.    Time 8    Period Weeks    Status On-going   05/03/21- met for current  07/05/2021- has lapsed, needs review   Target Date 08/30/21      PT LONG TERM GOAL #2   Title Patient will demonstrate improved endurance by completing 900' in 6MWT to access community.    Baseline 375' with 2 seated rest breaks needed, SPC used    Time 8    Period Weeks    Status On-going   05/03/21- 750' in 6 minutes with 4WRW, was able to complete 900' without rest break today.  07/05/2021- 450' with 4WRW, increased L foot pain.   Target Date 08/30/21      PT LONG TERM GOAL #3   Title Patient will score >19/24 on DGI to decrease risk of falls.    Baseline NT today    Time 8    Period Weeks    Status On-going   04/22/21- 15/24  07/05/2021- 14/24, no significant change.   Target Date 08/30/21      PT LONG TERM GOAL #4   Title Pt. will report 50% improvement in LLE pain.    Time 8    Period Weeks    Status On-going   05/31/21- pt reports 90% improvement 07/05/2021 - increased pain today.   Target Date 08/16/21      PT LONG TERM GOAL #5   Title Patient to score atleast 45/56 on BERG in order to decrease risk of falls.    Time 8    Period Weeks    Status Achieved   05/03/21- 47/56   Target Date 05/11/21      Additional Long Term Goals   Additional Long Term Goals Yes      PT LONG TERM GOAL #6   Title Demonstrate improved LE strength by completing 5x STS in <17 seconds.    Baseline 22 seconds    Time 8    Period Weeks    Status New    Target Date 08/30/21            PLAN:   PT Frequency Continue 2x / week       PT Duration 8 additional weeks  to 08/30/2021    PT  Treatment/Interventions ADLs/Self Care Home Management;Gait training;Stair training;Functional mobility training;Therapeutic activities;Therapeutic exercise;Balance training;Neuromuscular re-education;Patient/family education;Manual techniques;Iontophoresis 70m/ml Dexamethasone;Moist Heat;Cryotherapy;Passive range of motion;Dry needling;Joint Manipulations;Aquatic Therapy     PT Next Visit Plan Review HEP for strengthening, continue balance.           ERennie Natter PT, DPT  07/05/2021, 6:33 PM

## 2021-07-08 ENCOUNTER — Ambulatory Visit: Payer: No Typology Code available for payment source

## 2021-07-15 ENCOUNTER — Ambulatory Visit: Payer: No Typology Code available for payment source | Admitting: Physical Therapy

## 2021-07-15 ENCOUNTER — Encounter: Payer: Self-pay | Admitting: Physical Therapy

## 2021-07-15 DIAGNOSIS — R262 Difficulty in walking, not elsewhere classified: Secondary | ICD-10-CM

## 2021-07-15 DIAGNOSIS — M25562 Pain in left knee: Secondary | ICD-10-CM | POA: Diagnosis not present

## 2021-07-15 DIAGNOSIS — R2681 Unsteadiness on feet: Secondary | ICD-10-CM

## 2021-07-15 DIAGNOSIS — M6281 Muscle weakness (generalized): Secondary | ICD-10-CM

## 2021-07-15 DIAGNOSIS — G8929 Other chronic pain: Secondary | ICD-10-CM

## 2021-07-15 NOTE — Therapy (Signed)
OUTPATIENT PHYSICAL THERAPY TREATMENT NOTE    Patient Name: Andrew Banks MRN: [id number] DOB:11-Jun-1950, 71 y.o., male Today's Date: 07/15/2021  PCP: Felipa Eth, MD REFERRING PROVIDER: Jerolyn Shin. PA-C   PT End of Session - 07/15/21 1357     Visit Number 20    Number of Visits 30    Date for PT Re-Evaluation 08/30/21    Authorization Type [place]    Authorization Time Period 05/11/21-06/28/21    Authorization - Visit Number 6    Authorization - Number of Visits 16    Progress Note Due on Visit 83    PT Start Time 4098    PT Stop Time 1445    PT Time Calculation (min) 47 min    Activity Tolerance Patient tolerated treatment well    Behavior During Therapy WFL for tasks assessed/performed             Past Medical History:  Diagnosis Date   Arthritis    Degenerative disc disease, lumbar    Hypertension    Liver transplanted [facility])    Skin cancer    Past Surgical History:  Procedure Laterality Date   liver transplant     There are no problems to display for this patient.   REFERRING DIAG: M79.606 (ICD-10-CM) - Leg pain  THERAPY DIAG:  Chronic pain of left knee  Muscle weakness (generalized)  Difficulty in walking, not elsewhere classified  Unsteadiness on feet  Rationale for Evaluation and Treatment Rehabilitation  PERTINENT HISTORY: s/p ORIF Pelvic fx 11/12/19 s/p IMN L distal femur 08/12/2019 s/p L IM nailing femur  11/02/20 L foot drop  PRECAUTIONS: fall  SUBJECTIVE:  Pt reports incisions healing well.  Called orthotist and has appt. For next week.    PAIN:  Are you having pain? Yes: NPRS scale: 5/10 Pain location: L foot, 5/10 L hip  OBJECTIVE: (objective measures completed at initial evaluation unless otherwise dated)         Transfers                                                                       05/03/2021 07/05/2021     Five time sit to stand comments  11 seconds  22 seconds              6 minute walk test results       Aerobic  Endurance Distance Walked 750       Endurance additional comments with 4WRW, no rest breaks, increased LLE pain  600' with 4WRW              Balance      Balance Assessed Yes                Standardized Balance Assessment      Standardized Balance Assessment Berg Balance Test  DGI 14/24 - high risk of falls.  Used SPC     Five times sit to stand comments  11 seconds                Berg Balance Test      Sit to Stand Able to stand without using hands and stabilize independently       Standing Unsupported Able to stand safely 2  minutes       Sitting with Back Unsupported but Feet Supported on Floor or Stool Able to sit safely and securely 2 minutes       Stand to Sit Sits safely with minimal use of hands       Transfers Able to transfer safely, minor use of hands       Standing Unsupported with Eyes Closed Able to stand 10 seconds safely       Standing Unsupported with Feet Together Able to place feet together independently and stand 1 minute safely       From Standing, Reach Forward with Outstretched Arm Can reach forward >12 cm safely (5")       From Standing Position, Pick up Object from Floor Able to pick up shoe, needs supervision       From Standing Position, Turn to Look Behind Over each Shoulder Looks behind from both sides and weight shifts well       Turn 360 Degrees Able to turn 360 degrees safely but slowly       Standing Unsupported, Alternately Place Feet on Step/Stool Able to stand independently and complete 8 steps >20 seconds       Standing Unsupported, One Foot in Front Able to plae foot ahead of the other independently and hold 30 seconds       Standing on One Leg Tries to lift leg/unable to hold 3 seconds but remains standing independently       Total Score 47       TODAY'S TREATMENT:  07/15/2021: Therapeutic Exercise: to improve strength and mobility.  Demo, verbal and tactile cues throughout for technique. Nustep L6 x 6 min Therapeutic Activities: Practicing dynamic  transfers - sit to stand with immediate step over yoga brick, SPC in R hand, CGA for safety, x 10 bil.  Challenged with stepping over with R foot while in L SLS.  Gait Training:  180' with quad cane, CGA for safety.   Neuromuscular Reeducation: to improve balance and stability. Star excursion pattern x 3 each side,  with SBQC, CGA for safety. 180 deg forward to back.      07/05/2021  Physical Performance - 6MWT (600' with 4WRW, SBA), DGI, 5x STS- 22 seconds, no UE assist. Self Care- checking AFO, no redness, but did discuss return to orthotist Manual therapy - mobs to foot, ankle circles   06/09/2021 Therapeutic Exercise: to improve strength and mobility.  Verbal and tactile cues throughout for technique. Nustep x 5 min Level 6 LE only for warm-up.  BAPS level 1 DF/PF x 20, Inv/Ever x 20 with minA to stabilize ankle.  Seated ankle eversion x 15 with tactile cues to fibularis muscles, abducting toes x 10 all to L ankle/foot.   Manual therapy to L foot to decrease pain and improve mobility.  Mobs to forefoot, MTT, STM to plantar fascia and gentle stretches.  PATIENT EDUCATION: Education details: continue HEP.  Person educated: Patient Education method: Explanation, Demonstration, and Handouts Education comprehension: verbalized understanding and returned demonstration   HOME EXERCISE PROGRAM: Given prior  ASSESSMENT: Clinical Impression: Andrew Banks was challenged today to practice more dynamic activities with SPC and quad cane.  He was unsure of quad cane during gait, needed cuing for placement, but reported improved stability with quad cane and star excursion pattern.  Reporting increased LLE and back pain today with walking as well, needing break after 180'.  He is scheduled for injection in L knee so hopefully will not be  as limited by pain next session.  He would benefit from continued skilled therapy.    PT Short Term Goals - 04/06/21 1733       PT SHORT TERM GOAL #1   Title  Patient to be independent with initial HEP.    Time 2    Period Weeks    Status Achieved   04/06/21- reports good compliance and daily performance.   Target Date 03/30/21              PT Long Term Goals - 07/05/21 1557       PT LONG TERM GOAL #1   Title Patient to be independent with advanced HEP.    Time 8    Period Weeks    Status On-going   05/03/21- met for current  07/05/2021- has lapsed, needs review   Target Date 08/30/21      PT LONG TERM GOAL #2   Title Patient will demonstrate improved endurance by completing 900' in 6MWT to access community.    Baseline 375' with 2 seated rest breaks needed, SPC used    Time 8    Period Weeks    Status On-going   05/03/21- 750' in 6 minutes with 4WRW, was able to complete 900' without rest break today.  07/05/2021- 450' with 4WRW, increased L foot pain.   Target Date 08/30/21      PT LONG TERM GOAL #3   Title Patient will score >19/24 on DGI to decrease risk of falls.    Baseline NT today    Time 8    Period Weeks    Status On-going   04/22/21- 15/24  07/05/2021- 14/24, no significant change.   Target Date 08/30/21      PT LONG TERM GOAL #4   Title Pt. will report 50% improvement in LLE pain.    Time 8    Period Weeks    Status On-going   05/31/21- pt reports 90% improvement 07/05/2021 - increased pain today.   Target Date 08/16/21      PT LONG TERM GOAL #5   Title Patient to score atleast 45/56 on BERG in order to decrease risk of falls.    Time 8    Period Weeks    Status Achieved   05/03/21- 47/56   Target Date 05/11/21      Additional Long Term Goals   Additional Long Term Goals Yes      PT LONG TERM GOAL #6   Title Demonstrate improved LE strength by completing 5x STS in <17 seconds.    Baseline 22 seconds    Time 8    Period Weeks    Status New    Target Date 08/30/21            PLAN:   PT Frequency Continue 2x / week       PT Duration 8 additional weeks  to 08/30/2021    PT Treatment/Interventions ADLs/Self Care  Home Management;Gait training;Stair training;Functional mobility training;Therapeutic activities;Therapeutic exercise;Balance training;Neuromuscular re-education;Patient/family education;Manual techniques;Iontophoresis 59m/ml Dexamethasone;Moist Heat;Cryotherapy;Passive range of motion;Dry needling;Joint Manipulations;Aquatic Therapy     PT Next Visit Plan Review HEP for strengthening, continue balance.           ERennie Natter PT, DPT  07/15/2021, 3:52 PM

## 2021-07-19 ENCOUNTER — Encounter: Payer: Self-pay | Admitting: Physical Therapy

## 2021-07-19 ENCOUNTER — Ambulatory Visit: Payer: No Typology Code available for payment source | Admitting: Physical Therapy

## 2021-07-19 DIAGNOSIS — G8929 Other chronic pain: Secondary | ICD-10-CM

## 2021-07-19 DIAGNOSIS — M25562 Pain in left knee: Secondary | ICD-10-CM | POA: Diagnosis not present

## 2021-07-19 DIAGNOSIS — R2689 Other abnormalities of gait and mobility: Secondary | ICD-10-CM

## 2021-07-19 DIAGNOSIS — R2681 Unsteadiness on feet: Secondary | ICD-10-CM

## 2021-07-19 DIAGNOSIS — M6281 Muscle weakness (generalized): Secondary | ICD-10-CM

## 2021-07-19 DIAGNOSIS — R262 Difficulty in walking, not elsewhere classified: Secondary | ICD-10-CM

## 2021-07-19 NOTE — Therapy (Signed)
OUTPATIENT PHYSICAL THERAPY TREATMENT NOTE    Patient Name: Andrew Banks MRN: [id number] DOB:1950-10-13, 71 y.o., male Today's Date: 07/19/2021  PCP: Felipa Eth, MD REFERRING PROVIDER: Jerolyn Shin. PA-C   PT End of Session - 07/19/21 1409     Visit Number 21    Number of Visits 30    Date for PT Re-Evaluation 08/30/21    Authorization Type [place]    Authorization Time Period 05/11/21-06/28/21    Authorization - Visit Number 6    Authorization - Number of Visits 16    Progress Note Due on Visit 2    PT Start Time 1408    PT Stop Time 1448    PT Time Calculation (min) 40 min    Activity Tolerance Patient tolerated treatment well    Behavior During Therapy WFL for tasks assessed/performed             Past Medical History:  Diagnosis Date   Arthritis    Degenerative disc disease, lumbar    Hypertension    Liver transplanted [facility])    Skin cancer    Past Surgical History:  Procedure Laterality Date   liver transplant     There are no problems to display for this patient.   REFERRING DIAG: M79.606 (ICD-10-CM) - Leg pain  THERAPY DIAG:  Chronic pain of left knee  Muscle weakness (generalized)  Difficulty in walking, not elsewhere classified  Unsteadiness on feet  Chronic left-sided low back pain with left-sided sciatica  Other abnormalities of gait and mobility  Rationale for Evaluation and Treatment Rehabilitation  PERTINENT HISTORY: s/p ORIF Pelvic fx 11/12/19 s/p IMN L distal femur 08/12/2019 s/p L IM nailing femur  11/02/20 L foot drop  PRECAUTIONS: fall  SUBJECTIVE:  Pt reports incisions healing well.  Called orthotist and has appt. For next week.    PAIN:  Are you having pain? Yes: NPRS scale: 5/10 Pain location: L foot, 5/10 L hip  OBJECTIVE: (objective measures completed at initial evaluation unless otherwise dated)         Transfers                                                                       05/03/2021 07/05/2021     Five  time sit to stand comments  11 seconds  22 seconds              6 minute walk test results       Aerobic Endurance Distance Walked 750       Endurance additional comments with 4WRW, no rest breaks, increased LLE pain  600' with 4WRW              Balance      Balance Assessed Yes                Standardized Balance Assessment      Standardized Balance Assessment Berg Balance Test  DGI 14/24 - high risk of falls.  Used SPC     Five times sit to stand comments  11 seconds                Berg Balance Test      Sit to Stand Able to stand without using hands  and stabilize independently       Standing Unsupported Able to stand safely 2 minutes       Sitting with Back Unsupported but Feet Supported on Floor or Stool Able to sit safely and securely 2 minutes       Stand to Sit Sits safely with minimal use of hands       Transfers Able to transfer safely, minor use of hands       Standing Unsupported with Eyes Closed Able to stand 10 seconds safely       Standing Unsupported with Feet Together Able to place feet together independently and stand 1 minute safely       From Standing, Reach Forward with Outstretched Arm Can reach forward >12 cm safely (5")       From Standing Position, Pick up Object from Floor Able to pick up shoe, needs supervision       From Standing Position, Turn to Look Behind Over each Shoulder Looks behind from both sides and weight shifts well       Turn 360 Degrees Able to turn 360 degrees safely but slowly       Standing Unsupported, Alternately Place Feet on Step/Stool Able to stand independently and complete 8 steps >20 seconds       Standing Unsupported, One Foot in Front Able to plae foot ahead of the other independently and hold 30 seconds       Standing on One Leg Tries to lift leg/unable to hold 3 seconds but remains standing independently       Total Score 47       TODAY'S TREATMENT:  07/19/2021 Therapeutic Activities:to improve mobility and balance. Gait x 600'  with [phone] Practicing dynamic transfers - sit to stand with immediate step over yoga brick, SBQC in R hand, CGA for safety, x 10 L, 2 x 5 R.   Challenged with stepping over with R foot while in L SLS, reports increased L hip pain "like a toothache".   Kicking blocks with SBQC to navigate around equipment to pick them up again Ball toss and catch while standing on airex x 20 - CGA for safety Searching for bean bags around clinic with [facility]   07/15/2021: Therapeutic Exercise: to improve strength and mobility.  Demo, verbal and tactile cues throughout for technique. Nustep L6 x 6 min Therapeutic Activities: Practicing dynamic transfers - sit to stand with immediate step over yoga brick, SPC in R hand, CGA for safety, x 10 bil.  Challenged with stepping over with R foot while in L SLS.  Gait Training:  180' with quad cane, CGA for safety.   Neuromuscular Reeducation: to improve balance and stability. Star excursion pattern x 3 each side,  with SBQC, CGA for safety. 180 deg forward to back.    07/05/2021  Physical Performance - 6MWT (600' with 4WRW, SBA), DGI, 5x STS- 22 seconds, no UE assist. Self Care- checking AFO, no redness, but did discuss return to orthotist Manual therapy - mobs to foot, ankle circles    PATIENT EDUCATION: Education details: continue HEP.  Person educated: Patient Education method: Explanation, Demonstration, and Handouts Education comprehension: verbalized understanding and returned demonstration   HOME EXERCISE PROGRAM: Given prior  ASSESSMENT: Clinical Impression: Zadrian Mccauley reported more L hip pain after last session, also reported more swelling in LLE and color changes, recommended discussing with orthopedist at f/u appt. Next week.  He was able to tolerate continuing to perform more dynamic activities, but stabilizing  on LLE to step over blocks with R foot still increased L hip pain "like a toothache."  He demonstrated good balance with kicking blocks today,  and also improved stability with using SBQC, when searching clinic for bean bags did not report feeling any insecurity with SBQC compared to [facility].  He would benefit from continued skilled therapy.      PT Short Term Goals - 04/06/21 1733       PT SHORT TERM GOAL #1   Title Patient to be independent with initial HEP.    Time 2    Period Weeks    Status Achieved   04/06/21- reports good compliance and daily performance.   Target Date 03/30/21              PT Long Term Goals - 07/05/21 1557       PT LONG TERM GOAL #1   Title Patient to be independent with advanced HEP.    Time 8    Period Weeks    Status On-going   05/03/21- met for current  07/05/2021- has lapsed, needs review   Target Date 08/30/21      PT LONG TERM GOAL #2   Title Patient will demonstrate improved endurance by completing 900' in 6MWT to access community.    Baseline 375' with 2 seated rest breaks needed, SPC used    Time 8    Period Weeks    Status On-going   05/03/21- 750' in 6 minutes with 4WRW, was able to complete 900' without rest break today.  07/05/2021- 450' with 4WRW, increased L foot pain.   Target Date 08/30/21      PT LONG TERM GOAL #3   Title Patient will score >19/24 on DGI to decrease risk of falls.    Baseline NT today    Time 8    Period Weeks    Status On-going   04/22/21- 15/24  07/05/2021- 14/24, no significant change.   Target Date 08/30/21      PT LONG TERM GOAL #4   Title Pt. will report 50% improvement in LLE pain.    Time 8    Period Weeks    Status On-going   05/31/21- pt reports 90% improvement 07/05/2021 - increased pain today.   Target Date 08/16/21      PT LONG TERM GOAL #5   Title Patient to score atleast 45/56 on BERG in order to decrease risk of falls.    Time 8    Period Weeks    Status Achieved   05/03/21- 47/56   Target Date 05/11/21      Additional Long Term Goals   Additional Long Term Goals Yes      PT LONG TERM GOAL #6   Title Demonstrate improved LE strength by  completing 5x STS in <17 seconds.    Baseline 22 seconds    Time 8    Period Weeks    Status New    Target Date 08/30/21            PLAN:   PT Frequency Continue 2x / week       PT Duration 8 additional weeks  to 08/30/2021    PT Treatment/Interventions ADLs/Self Care Home Management;Gait training;Stair training;Functional mobility training;Therapeutic activities;Therapeutic exercise;Balance training;Neuromuscular re-education;Patient/family education;Manual techniques;Iontophoresis 68m/ml Dexamethasone;Moist Heat;Cryotherapy;Passive range of motion;Dry needling;Joint Manipulations;Aquatic Therapy     PT Next Visit Plan Review HEP for strengthening, continue strength, balance, gait training with SBQC.  Rennie Natter, PT, DPT  07/19/2021, 2:58 PM

## 2021-07-22 ENCOUNTER — Ambulatory Visit: Payer: No Typology Code available for payment source

## 2021-07-22 DIAGNOSIS — M25562 Pain in left knee: Secondary | ICD-10-CM | POA: Diagnosis not present

## 2021-07-22 DIAGNOSIS — R2681 Unsteadiness on feet: Secondary | ICD-10-CM

## 2021-07-22 DIAGNOSIS — M6281 Muscle weakness (generalized): Secondary | ICD-10-CM

## 2021-07-22 DIAGNOSIS — R262 Difficulty in walking, not elsewhere classified: Secondary | ICD-10-CM

## 2021-07-22 DIAGNOSIS — G8929 Other chronic pain: Secondary | ICD-10-CM

## 2021-07-22 NOTE — Therapy (Signed)
OUTPATIENT PHYSICAL THERAPY TREATMENT NOTE    Patient Name: Andrew Banks MRN: [id number] DOB:07-28-1950, 71 y.o., male Today's Date: 07/22/2021  PCP: Felipa Eth, MD REFERRING PROVIDER: Jerolyn Shin. PA-C   PT End of Session - 07/22/21 1502     Visit Number 22    Number of Visits 30    Date for PT Re-Evaluation 08/30/21    Authorization Type [place]    Authorization Time Period 05/11/21-06/28/21    Authorization - Visit Number 6    Authorization - Number of Visits 16    Progress Note Due on Visit 29    PT Start Time 1402    PT Stop Time 1445    PT Time Calculation (min) 43 min    Activity Tolerance Patient tolerated treatment well    Behavior During Therapy WFL for tasks assessed/performed              Past Medical History:  Diagnosis Date   Arthritis    Degenerative disc disease, lumbar    Hypertension    Liver transplanted [facility])    Skin cancer    Past Surgical History:  Procedure Laterality Date   liver transplant     There are no problems to display for this patient.   REFERRING DIAG: M79.606 (ICD-10-CM) - Leg pain  THERAPY DIAG:  Chronic pain of left knee  Muscle weakness (generalized)  Difficulty in walking, not elsewhere classified  Unsteadiness on feet  Rationale for Evaluation and Treatment Rehabilitation  PERTINENT HISTORY: s/p ORIF Pelvic fx 11/12/19 s/p IMN L distal femur 08/12/2019 s/p L IM nailing femur  11/02/20 L foot drop  PRECAUTIONS: fall  SUBJECTIVE:  Pt reports back and L hip pain. PAIN:  Are you having pain? Yes: NPRS scale: 6/10 Pain location: 6/10 in low back - 7/10 L hip  OBJECTIVE: (objective measures completed at initial evaluation unless otherwise dated)         Transfers                                                                       05/03/2021 07/05/2021     Five time sit to stand comments  11 seconds  22 seconds              6 minute walk test results       Aerobic Endurance Distance Walked 750        Endurance additional comments with 4WRW, no rest breaks, increased LLE pain  600' with 4WRW              Balance      Balance Assessed Yes                Standardized Balance Assessment      Standardized Balance Assessment Berg Balance Test  DGI 14/24 - high risk of falls.  Used SPC     Five times sit to stand comments  11 seconds                Berg Balance Test      Sit to Stand Able to stand without using hands and stabilize independently       Standing Unsupported Able to stand safely 2 minutes  Sitting with Back Unsupported but Feet Supported on Floor or Stool Able to sit safely and securely 2 minutes       Stand to Sit Sits safely with minimal use of hands       Transfers Able to transfer safely, minor use of hands       Standing Unsupported with Eyes Closed Able to stand 10 seconds safely       Standing Unsupported with Feet Together Able to place feet together independently and stand 1 minute safely       From Standing, Reach Forward with Outstretched Arm Can reach forward >12 cm safely (5")       From Standing Position, Pick up Object from Floor Able to pick up shoe, needs supervision       From Standing Position, Turn to Look Behind Over each Shoulder Looks behind from both sides and weight shifts well       Turn 360 Degrees Able to turn 360 degrees safely but slowly       Standing Unsupported, Alternately Place Feet on Step/Stool Able to stand independently and complete 8 steps >20 seconds       Standing Unsupported, One Foot in Front Able to plae foot ahead of the other independently and hold 30 seconds       Standing on One Leg Tries to lift leg/unable to hold 3 seconds but remains standing independently       Total Score 47       TODAY'S TREATMENT:  07/22/21 Therapeutic Exercise: Nustep L6x21mn  Neuromuscular Re-ed: Sidesteps on balance beam 3x down/back Tandem walk on balance beam 3x down/back Standing kicking black foam pads with targets varying in height and  distance STS with ball catch and throw in stance 10x with red wt ball Step ups on bosu fwd 8x  07/19/2021 Therapeutic Activities:to improve mobility and balance. Gait x 600' with [id number] dynamic transfers - sit to stand with immediate step over yoga brick, SBQC in R hand, CGA for safety, x 10 L, 2 x 5 R.   Challenged with stepping over with R foot while in L SLS, reports increased L hip pain "like a toothache".   Kicking blocks with SBQC to navigate around equipment to pick them up again Ball toss and catch while standing on airex x 20 - CGA for safety [facility]  07/15/2021: Therapeutic Exercise: to improve strength and mobility.  Demo, verbal and tactile cues throughout for technique. Nustep L6 x 6 min Therapeutic Activities: Practicing dynamic transfers - sit to stand with immediate step over yoga brick, SPC in R hand, CGA for safety, x 10 bil.  Challenged with stepping over with R foot while in L SLS.  Gait Training:  180' with quad cane, CGA for safety.   Neuromuscular Reeducation: to improve balance and stability. Star excursion pattern x 3 each side,  with SBQC, CGA for safety. 180 deg forward to back.    07/05/2021  Physical Performance - 6MWT (600' with 4WRW, SBA), DGI, 5x STS- 22 seconds, no UE assist. Self Care- checking AFO, no redness, but did discuss return to orthotist Manual therapy - mobs to foot, ankle circles    PATIENT EDUCATION: Education details: continue HEP.  Person educated: Patient Education method: Explanation, Demonstration, and Handouts Education comprehension: verbalized understanding and returned demonstration   HOME EXERCISE PROGRAM: Given prior  ASSESSMENT: Clinical Impression: Mr. JLanglandcontinues to have chronic pain in back and L hip.  We incorporated dynamic balance on unsteady surfaces, pt required min-modA today to prevent falls and for safety. Cues required to maintain upright stability especially  before each step with BOSU step ups. He was able to perform kicks w/o UE support but had LOB x 2 with minA given for recovery. Pt would continue to benefit from progression of balance and stability exercises.     PT Short Term Goals - 04/06/21 1733       PT SHORT TERM GOAL #1   Title Patient to be independent with initial HEP.    Time 2    Period Weeks    Status Achieved   04/06/21- reports good compliance and daily performance.   Target Date 03/30/21              PT Long Term Goals - 07/05/21 1557       PT LONG TERM GOAL #1   Title Patient to be independent with advanced HEP.    Time 8    Period Weeks    Status On-going   05/03/21- met for current  07/05/2021- has lapsed, needs review   Target Date 08/30/21      PT LONG TERM GOAL #2   Title Patient will demonstrate improved endurance by completing 900' in 6MWT to access community.    Baseline 375' with 2 seated rest breaks needed, SPC used    Time 8    Period Weeks    Status On-going   05/03/21- 750' in 6 minutes with 4WRW, was able to complete 900' without rest break today.  07/05/2021- 450' with 4WRW, increased L foot pain.   Target Date 08/30/21      PT LONG TERM GOAL #3   Title Patient will score >19/24 on DGI to decrease risk of falls.    Baseline NT today    Time 8    Period Weeks    Status On-going   04/22/21- 15/24  07/05/2021- 14/24, no significant change.   Target Date 08/30/21      PT LONG TERM GOAL #4   Title Pt. will report 50% improvement in LLE pain.    Time 8    Period Weeks    Status On-going   05/31/21- pt reports 90% improvement 07/05/2021 - increased pain today.   Target Date 08/16/21      PT LONG TERM GOAL #5   Title Patient to score atleast 45/56 on BERG in order to decrease risk of falls.    Time 8    Period Weeks    Status Achieved   05/03/21- 47/56   Target Date 05/11/21      Additional Long Term Goals   Additional Long Term Goals Yes      PT LONG TERM GOAL #6   Title Demonstrate improved LE  strength by completing 5x STS in <17 seconds.    Baseline 22 seconds    Time 8    Period Weeks    Status New    Target Date 08/30/21            PLAN:   PT Frequency Continue 2x / week       PT Duration 8 additional weeks  to 08/30/2021    PT Treatment/Interventions ADLs/Self Care Home Management;Gait training;Stair training;Functional mobility training;Therapeutic activities;Therapeutic exercise;Balance training;Neuromuscular re-education;Patient/family education;Manual techniques;Iontophoresis 57m/ml Dexamethasone;Moist Heat;Cryotherapy;Passive range of motion;Dry needling;Joint Manipulations;Aquatic Therapy     PT Next Visit Plan Review HEP for strengthening, continue strength, balance, gait training with SBQC.  Artist Pais, PTA 07/22/2021, 3:56 PM

## 2021-07-26 ENCOUNTER — Encounter: Payer: Self-pay | Admitting: Physical Therapy

## 2021-07-26 ENCOUNTER — Ambulatory Visit: Payer: No Typology Code available for payment source | Admitting: Physical Therapy

## 2021-07-26 DIAGNOSIS — M25562 Pain in left knee: Secondary | ICD-10-CM | POA: Diagnosis not present

## 2021-07-26 DIAGNOSIS — M6281 Muscle weakness (generalized): Secondary | ICD-10-CM

## 2021-07-26 DIAGNOSIS — R2681 Unsteadiness on feet: Secondary | ICD-10-CM

## 2021-07-26 DIAGNOSIS — G8929 Other chronic pain: Secondary | ICD-10-CM

## 2021-07-26 DIAGNOSIS — R262 Difficulty in walking, not elsewhere classified: Secondary | ICD-10-CM

## 2021-07-29 ENCOUNTER — Ambulatory Visit: Payer: No Typology Code available for payment source | Admitting: Physical Therapy

## 2021-07-29 ENCOUNTER — Encounter: Payer: Self-pay | Admitting: Physical Therapy

## 2021-07-29 DIAGNOSIS — R262 Difficulty in walking, not elsewhere classified: Secondary | ICD-10-CM

## 2021-07-29 DIAGNOSIS — G8929 Other chronic pain: Secondary | ICD-10-CM

## 2021-07-29 DIAGNOSIS — M25562 Pain in left knee: Secondary | ICD-10-CM | POA: Diagnosis not present

## 2021-07-29 DIAGNOSIS — M6281 Muscle weakness (generalized): Secondary | ICD-10-CM

## 2021-07-29 DIAGNOSIS — R2681 Unsteadiness on feet: Secondary | ICD-10-CM

## 2021-07-29 NOTE — Therapy (Signed)
OUTPATIENT PHYSICAL THERAPY TREATMENT NOTE    Patient Name: Andrew Banks MRN: [id number] DOB:12-Dec-1950, 71 y.o., male Today's Date: 07/29/2021  PCP: Felipa Eth, MD REFERRING PROVIDER: Jerolyn Shin. PA-C   PT End of Session - 07/29/21 1543     Visit Number 24    Number of Visits 30    Date for PT Re-Evaluation 08/30/21    Authorization Type [place]    Authorization Time Period 05/11/21-06/28/21    Authorization - Visit Number 6    Authorization - Number of Visits 16    Progress Note Due on Visit 41    PT Start Time 1542    PT Stop Time 1615    PT Time Calculation (min) 33 min    Activity Tolerance Patient tolerated treatment well    Behavior During Therapy WFL for tasks assessed/performed              Past Medical History:  Diagnosis Date   Arthritis    Degenerative disc disease, lumbar    Hypertension    Liver transplanted [facility])    Skin cancer    Past Surgical History:  Procedure Laterality Date   liver transplant     There are no problems to display for this patient.   REFERRING DIAG: M79.606 (ICD-10-CM) - Leg pain  THERAPY DIAG:  Chronic pain of left knee  Muscle weakness (generalized)  Difficulty in walking, not elsewhere classified  Unsteadiness on feet  Chronic left-sided low back pain with left-sided sciatica  Rationale for Evaluation and Treatment Rehabilitation  PERTINENT HISTORY: s/p ORIF Pelvic fx 11/12/19 s/p IMN L distal femur 08/12/2019 s/p L IM nailing femur  11/02/20 L foot drop  PRECAUTIONS: fall  SUBJECTIVE:  Patient reports he has stopped wearing his AFO until he can get to the orthotist.  He also feels the AFO is stopping ankle DF.  More pain today "but I've done a lot of exercises since I last saw you" on his deck.     PAIN:  Are you having pain? Yes: NPRS scale: 7/10 Pain location: L knee ankle and foot, bil thighs  OBJECTIVE: (objective measures completed at initial evaluation unless otherwise dated)          Transfers                                                                       05/03/2021 07/05/2021     Five time sit to stand comments  11 seconds  22 seconds              6 minute walk test results       Aerobic Endurance Distance Walked 750       Endurance additional comments with 4WRW, no rest breaks, increased LLE pain  600' with 4WRW              Balance      Balance Assessed Yes                Standardized Balance Assessment      Standardized Balance Assessment Berg Balance Test  DGI 14/24 - high risk of falls.  Used SPC     Five times sit to stand comments  11 seconds  Berg Balance Test      Sit to Stand Able to stand without using hands and stabilize independently       Standing Unsupported Able to stand safely 2 minutes       Sitting with Back Unsupported but Feet Supported on Floor or Stool Able to sit safely and securely 2 minutes       Stand to Sit Sits safely with minimal use of hands       Transfers Able to transfer safely, minor use of hands       Standing Unsupported with Eyes Closed Able to stand 10 seconds safely       Standing Unsupported with Feet Together Able to place feet together independently and stand 1 minute safely       From Standing, Reach Forward with Outstretched Arm Can reach forward >12 cm safely (5")       From Standing Position, Pick up Object from Floor Able to pick up shoe, needs supervision       From Standing Position, Turn to Look Behind Over each Shoulder Looks behind from both sides and weight shifts well       Turn 360 Degrees Able to turn 360 degrees safely but slowly       Standing Unsupported, Alternately Place Feet on Step/Stool Able to stand independently and complete 8 steps >20 seconds       Standing Unsupported, One Foot in Front Able to plae foot ahead of the other independently and hold 30 seconds       Standing on One Leg Tries to lift leg/unable to hold 3 seconds but remains standing independently       Total Score 47        TODAY'S TREATMENT:  07/29/2021 Therapeutic Exercise: to improve strength and mobility.   Nustep L6 x 7 min  BAPS board Level 3 -DF/PF x 20, Inv/Ever x 20, CW circle x 20, CCW x 20  Cybex knee extension 30# 2 x 10  Cybex knee flexion 30# 2 x 10  Cybex leg press  25# 2 x 10  07/26/2021  Therapeutic Exercise: to improve strength and mobility.  Demo and verbal cues throughout for technique, close SBA/CGA as transitioned between exercise machines for safety. Nustep L6x35mn Gait with SBQC x 150' with CGA for safety (AFO removed)  Cybex knee extension 25# 3 x 10  Cybex knee flexion 25# 3 x 10  Cybex leg press 20# x 10, 25# 2 x 10 Cybex toe press 20# x 10 - difficulty keeping L foot on plate in this position Cybex rows 25# 3 x 10  Sit to stands with overhead press weighted yellow ball 2 x 10  07/22/21 Therapeutic Exercise: Nustep L6x644m  Neuromuscular Re-ed: Sidesteps on balance beam 3x down/back Tandem walk on balance beam 3x down/back Standing kicking black foam pads with targets varying in height and distance STS with ball catch and throw in stance 10x with red wt ball Step ups on bosu fwd 8x   PATIENT EDUCATION: Education details: continue HEP.  Person educated: Patient Education method: Explanation, Demonstration, and Handouts Education comprehension: verbalized understanding and returned demonstration   HOME EXERCISE PROGRAM: Given prior  ASSESSMENT: Clinical Impression: HeArmond Cuthrelleports good compliance with HEP at home, reporting more fatigue and pain today because of work at home.  He also reported more shortness of breath due to poor air quality, needing to use inhaler during session.  He does demonstrate significantly improved muscle activation and ROM in  L ankle, able to perform level 3 BAPS board all directions, including inversion and eversion.  Continued to advance resistance with weight machines to improve LE strength.   Andrew Banks continues to  demonstrate potential for improvement and would benefit from continued skilled therapy to address impairments.       PT Short Term Goals - 04/06/21 1733       PT SHORT TERM GOAL #1   Title Patient to be independent with initial HEP.    Time 2    Period Weeks    Status Achieved   04/06/21- reports good compliance and daily performance.   Target Date 03/30/21              PT Long Term Goals - 07/05/21 1557       PT LONG TERM GOAL #1   Title Patient to be independent with advanced HEP.    Time 8    Period Weeks    Status On-going   05/03/21- met for current  07/05/2021- has lapsed, needs review   Target Date 08/30/21      PT LONG TERM GOAL #2   Title Patient will demonstrate improved endurance by completing 900' in 6MWT to access community.    Baseline 375' with 2 seated rest breaks needed, SPC used    Time 8    Period Weeks    Status On-going   05/03/21- 750' in 6 minutes with 4WRW, was able to complete 900' without rest break today.  07/05/2021- 450' with 4WRW, increased L foot pain.   Target Date 08/30/21      PT LONG TERM GOAL #3   Title Patient will score >19/24 on DGI to decrease risk of falls.    Baseline NT today    Time 8    Period Weeks    Status On-going   04/22/21- 15/24  07/05/2021- 14/24, no significant change.   Target Date 08/30/21      PT LONG TERM GOAL #4   Title Pt. will report 50% improvement in LLE pain.    Time 8    Period Weeks    Status On-going   05/31/21- pt reports 90% improvement 07/05/2021 - increased pain today.   Target Date 08/16/21      PT LONG TERM GOAL #5   Title Patient to score atleast 45/56 on BERG in order to decrease risk of falls.    Time 8    Period Weeks    Status Achieved   05/03/21- 47/56   Target Date 05/11/21      Additional Long Term Goals   Additional Long Term Goals Yes      PT LONG TERM GOAL #6   Title Demonstrate improved LE strength by completing 5x STS in <17 seconds.    Baseline 22 seconds    Time 8    Period Weeks     Status New    Target Date 08/30/21            PLAN:   PT Frequency Continue 2x / week       PT Duration 8 additional weeks  to 08/30/2021    PT Treatment/Interventions ADLs/Self Care Home Management;Gait training;Stair training;Functional mobility training;Therapeutic activities;Therapeutic exercise;Balance training;Neuromuscular re-education;Patient/family education;Manual techniques;Iontophoresis 58m/ml Dexamethasone;Moist Heat;Cryotherapy;Passive range of motion;Dry needling;Joint Manipulations;Aquatic Therapy     PT Next Visit Plan Continue strengthening, balance, gait training with SBQC.            ERennie Natter PT, DPT 07/29/2021, 4:19 PM

## 2021-08-03 NOTE — Therapy (Signed)
OUTPATIENT PHYSICAL THERAPY TREATMENT NOTE    Patient Name: Andrew Banks MRN: [id number] DOB:Oct 06, 1950, 71 y.o., male Today's Date: 08/04/2021  PCP: Andrew Eth, MD REFERRING PROVIDER: Jerolyn Shin. Banks   PT End of Session - 08/04/21 1348     Visit Number 25    Number of Visits 30    Date for PT Re-Evaluation 08/30/21    Authorization Type [place]    Authorization Time Period 05/11/21-06/28/21    Authorization - Visit Number 7    Authorization - Number of Visits 16    Progress Note Due on Visit 49    PT Start Time 7680    PT Stop Time 1427    PT Time Calculation (min) 39 min    Activity Tolerance Patient tolerated treatment well    Behavior During Therapy WFL for tasks assessed/performed               Past Medical History:  Diagnosis Date   Arthritis    Degenerative disc disease, lumbar    Hypertension    Liver transplanted [facility])    Skin cancer    Past Surgical History:  Procedure Laterality Date   liver transplant     There are no problems to display for this patient.   REFERRING DIAG: M79.606 (ICD-10-CM) - Leg pain  THERAPY DIAG:  Chronic pain of left knee  Muscle weakness (generalized)  Difficulty in walking, not elsewhere classified  Unsteadiness on feet  Chronic left-sided low back pain with left-sided sciatica  Other abnormalities of gait and mobility  Rationale for Evaluation and Treatment Rehabilitation  PERTINENT HISTORY: s/p ORIF Pelvic fx 11/12/19 s/p IMN L distal femur 08/12/2019 s/p L IM nailing femur  11/02/20 L foot drop  PRECAUTIONS: fall  SUBJECTIVE:  Nothing to report. Pt reporting improved ROM and ability to do more things. This often brings increased soreness the next day.  PAIN:  Are you having pain? Yes: NPRS scale: 6/10 Pain location: L knee ankle and foot, bil thighs  OBJECTIVE: (objective measures completed at initial evaluation unless otherwise dated)         Transfers                                                                        05/03/2021 07/05/2021     Five time sit to stand comments  11 seconds  22 seconds              6 minute walk test results       Aerobic Endurance Distance Walked 750       Endurance additional comments with 4WRW, no rest breaks, increased LLE pain  600' with 4WRW              Balance      Balance Assessed Yes                Standardized Balance Assessment      Standardized Balance Assessment Berg Balance Test  DGI 14/24 - high risk of falls.  Used SPC     Five times sit to stand comments  11 seconds                [employer]  Sit to Stand Able to stand without using hands and stabilize independently       Standing Unsupported Able to stand safely 2 minutes       Sitting with Back Unsupported but Feet Supported on Floor or Stool Able to sit safely and securely 2 minutes       Stand to Sit Sits safely with minimal use of hands       Transfers Able to transfer safely, minor use of hands       Standing Unsupported with Eyes Closed Able to stand 10 seconds safely       Standing Unsupported with Feet Together Able to place feet together independently and stand 1 minute safely       From Standing, Reach Forward with Outstretched Arm Can reach forward >12 cm safely (5")       From Standing Position, Pick up Object from Floor Able to pick up shoe, needs supervision       From Standing Position, Turn to Look Behind Over each Shoulder Looks behind from both sides and weight shifts well       Turn 360 Degrees Able to turn 360 degrees safely but slowly       Standing Unsupported, Alternately Place Feet on Step/Stool Able to stand independently and complete 8 steps >20 seconds       Standing Unsupported, One Foot in Front Able to plae foot ahead of the other independently and hold 30 seconds       Standing on One Leg Tries to lift leg/unable to hold 3 seconds but remains standing independently       Total Score 47       TODAY'S TREATMENT:  08/04/21 Nustep L6 x 7  min  BAPS board Level 3 -DF/PF x 20, Inv/Ever x 20, CW circle x 20, CCW x 20  Active L ankle circles CW/CCW x 20 ea Sit to stands with overhead press weighted yellow ball 2 x 10 Cybex leg press  25# 2 x 10 Cybex heel raise 15# x 15 Cybex knee flexion 30# 2 x 10 Cybex knee extension 30# 1 x 10, 25# 1x10 Standing MFR to low back with large foam roller (squatting in rollator with foam against wall) MFR with ball to low back and gluteals Gait: SPC 160 ft   07/29/2021 Therapeutic Exercise: to improve strength and mobility.   Nustep L6 x 7 min  BAPS board Level 3 -DF/PF x 20, Inv/Ever x 20, CW circle x 20, CCW x 20  Cybex knee extension 30# 2 x 10  Cybex knee flexion 30# 2 x 10  Cybex leg press  25# 2 x 10  07/26/2021  Therapeutic Exercise: to improve strength and mobility.  Demo and verbal cues throughout for technique, close SBA/CGA as transitioned between exercise machines for safety. Nustep L6x58mn Gait with SBQC x 150' with CGA for safety (AFO removed)  Cybex knee extension 25# 3 x 10  Cybex knee flexion 25# 3 x 10  Cybex leg press 20# x 10, 25# 2 x 10 Cybex toe press 20# x 10 - difficulty keeping L foot on plate in this position Cybex rows 25# 3 x 10  Sit to stands with overhead press weighted yellow ball 2 x 10   PATIENT EDUCATION: Education details: continue HEP.  Person educated: Patient Education method: Explanation, Demonstration, and Handouts Education comprehension: verbalized understanding and returned demonstration   HOME EXERCISE PROGRAM: Given prior  ASSESSMENT: Clinical Impression: Andrew Banks tolerated TE well  today. He experienced some pain with in the knees and back with Leg extension today, [name], [name]     PT Short Term Goals - 04/06/21 1733       PT SHORT TERM GOAL #1   Title Patient to be independent with initial HEP.    Time 2    Period Weeks    Status Achieved   04/06/21- reports good compliance and daily performance.   Target Date 03/30/21              PT Long Term Goals - 07/05/21 1557       PT LONG TERM GOAL #1   Title Patient to be independent with advanced HEP.    Time 8    Period Weeks    Status On-going   05/03/21- met for current  07/05/2021- has lapsed, needs review   Target Date 08/30/21      PT LONG TERM GOAL #2   Title Patient will demonstrate improved endurance by completing 900' in 6MWT to access community.    Baseline 375' with 2 seated rest breaks needed, SPC used    Time 8    Period Weeks    Status On-going   05/03/21- 750' in 6 minutes with 4WRW, was able to complete 900' without rest break today.  07/05/2021- 450' with 4WRW, increased L foot pain.   Target Date 08/30/21      PT LONG TERM GOAL #3   Title Patient will score >19/24 on DGI to decrease risk of falls.    Baseline NT today    Time 8    Period Weeks    Status On-going   04/22/21- 15/24  07/05/2021- 14/24, no significant change.   Target Date 08/30/21      PT LONG TERM GOAL #4   Title Pt. will report 50% improvement in LLE pain.    Time 8    Period Weeks    Status On-going   05/31/21- pt reports 90% improvement 07/05/2021 - increased pain today.   Target Date 08/16/21      PT LONG TERM GOAL #5   Title Patient to score atleast 45/56 on BERG in order to decrease risk of falls.    Time 8    Period Weeks    Status Achieved   05/03/21- 47/56   Target Date 05/11/21      Additional Long Term Goals   Additional Long Term Goals Yes      PT LONG TERM GOAL #6   Title Demonstrate improved LE strength by completing 5x STS in <17 seconds.    Baseline 22 seconds    Time 8    Period Weeks    Status New    Target Date 08/30/21            PLAN:    PT Frequency Continue 2x / week       PT Duration 8 additional weeks  to 08/30/2021    PT Treatment/Interventions ADLs/Self Care Home Management;Gait training;Stair training;Functional mobility training;Therapeutic activities;Therapeutic exercise;Balance training;Neuromuscular re-education;Patient/family education;Manual techniques;Iontophoresis 76m/ml Dexamethasone;Moist Heat;Cryotherapy;Passive range of motion;Dry needling;Joint Manipulations;Aquatic Therapy     PT Next Visit Plan Continue strengthening, balance, gait training with SBQC.  Lolly Glaus, PT, DPT 08/04/2021, 2:34 PM

## 2021-08-04 ENCOUNTER — Encounter: Payer: Self-pay | Admitting: Physical Therapy

## 2021-08-04 ENCOUNTER — Ambulatory Visit: Payer: No Typology Code available for payment source | Attending: Emergency Medicine | Admitting: Physical Therapy

## 2021-08-04 DIAGNOSIS — R262 Difficulty in walking, not elsewhere classified: Secondary | ICD-10-CM | POA: Insufficient documentation

## 2021-08-04 DIAGNOSIS — R2681 Unsteadiness on feet: Secondary | ICD-10-CM | POA: Insufficient documentation

## 2021-08-04 DIAGNOSIS — R2689 Other abnormalities of gait and mobility: Secondary | ICD-10-CM | POA: Insufficient documentation

## 2021-08-04 DIAGNOSIS — M25562 Pain in left knee: Secondary | ICD-10-CM | POA: Diagnosis present

## 2021-08-04 DIAGNOSIS — M6281 Muscle weakness (generalized): Secondary | ICD-10-CM | POA: Diagnosis present

## 2021-08-04 DIAGNOSIS — G8929 Other chronic pain: Secondary | ICD-10-CM | POA: Diagnosis present

## 2021-08-04 DIAGNOSIS — M5442 Lumbago with sciatica, left side: Secondary | ICD-10-CM | POA: Diagnosis present

## 2021-08-17 ENCOUNTER — Ambulatory Visit: Payer: No Typology Code available for payment source

## 2021-08-17 DIAGNOSIS — G8929 Other chronic pain: Secondary | ICD-10-CM

## 2021-08-17 DIAGNOSIS — R262 Difficulty in walking, not elsewhere classified: Secondary | ICD-10-CM

## 2021-08-17 DIAGNOSIS — R2681 Unsteadiness on feet: Secondary | ICD-10-CM

## 2021-08-17 DIAGNOSIS — M6281 Muscle weakness (generalized): Secondary | ICD-10-CM

## 2021-08-17 DIAGNOSIS — M25562 Pain in left knee: Secondary | ICD-10-CM | POA: Diagnosis not present

## 2021-08-17 DIAGNOSIS — R2689 Other abnormalities of gait and mobility: Secondary | ICD-10-CM

## 2021-08-17 NOTE — Therapy (Signed)
OUTPATIENT PHYSICAL THERAPY TREATMENT NOTE    Patient Name: Andrew Banks MRN: [id number] DOB:[birth year], 71 y.o., male Today's Date: 08/17/2021  PCP: Felipa Eth, MD REFERRING PROVIDER: Jerolyn Shin. PA-C   PT End of Session - 08/17/21 1454     Visit Number 26    Number of Visits 30    Date for PT Re-Evaluation 08/30/21    Authorization Type [facility]    Progress Note Due on Visit 41    PT Start Time 1403    PT Stop Time 1446    PT Time Calculation (min) 43 min    Activity Tolerance Patient tolerated treatment well    Behavior During Therapy WFL for tasks assessed/performed                Past Medical History:  Diagnosis Date   Arthritis    Degenerative disc disease, lumbar    Hypertension    Liver transplanted [facility])    Skin cancer    Past Surgical History:  Procedure Laterality Date   liver transplant     There are no problems to display for this patient.   REFERRING DIAG: M79.606 (ICD-10-CM) - Leg pain  THERAPY DIAG:  Chronic pain of left knee  Muscle weakness (generalized)  Difficulty in walking, not elsewhere classified  Unsteadiness on feet  Chronic left-sided low back pain with left-sided sciatica  Other abnormalities of gait and mobility  Rationale for Evaluation and Treatment Rehabilitation  PERTINENT HISTORY: s/p ORIF Pelvic fx 11/12/19 s/p IMN L distal femur 08/12/2019 s/p L IM nailing femur  11/02/20 L foot drop  PRECAUTIONS: fall  SUBJECTIVE:  Pt reports doc reports he needs a knee replacement.   PAIN:  Are you having pain? Yes: NPRS scale: 7/10 Pain location: L knee ankle and foot, bil thighs  OBJECTIVE: (objective measures completed at initial evaluation unless otherwise dated)         Transfers                                                                       05/03/2021 07/05/2021     Five time sit to stand comments  11 seconds  22 seconds              6 minute walk test results       Aerobic Endurance Distance Walked  750       Endurance additional comments with 4WRW, no rest breaks, increased LLE pain  600' with 4WRW              Balance      Balance Assessed Yes                Standardized Balance Assessment      Standardized Balance Assessment Berg Balance Test  DGI 14/24 - high risk of falls.  Used SPC     Five times sit to stand comments  11 seconds                Berg Balance Test      Sit to Stand Able to stand without using hands and stabilize independently       Standing Unsupported Able to stand safely 2 minutes       Sitting with  Back Unsupported but Feet Supported on Floor or Stool Able to sit safely and securely 2 minutes       Stand to Sit Sits safely with minimal use of hands       Transfers Able to transfer safely, minor use of hands       Standing Unsupported with Eyes Closed Able to stand 10 seconds safely       Standing Unsupported with Feet Together Able to place feet together independently and stand 1 minute safely       From Standing, Reach Forward with Outstretched Arm Can reach forward >12 cm safely (5")       From Standing Position, Pick up Object from Floor Able to pick up shoe, needs supervision       From Standing Position, Turn to Look Behind Over each Shoulder Looks behind from both sides and weight shifts well       Turn 360 Degrees Able to turn 360 degrees safely but slowly       Standing Unsupported, Alternately Place Feet on Step/Stool Able to stand independently and complete 8 steps >20 seconds       Standing Unsupported, One Foot in Front Able to plae foot ahead of the other independently and hold 30 seconds       Standing on One Leg Tries to lift leg/unable to hold 3 seconds but remains standing independently       Total Score 47       TODAY'S TREATMENT:  08/16/21 Therapeutic Exercise: Nustep L6 x 7 min  Bridges 20 reps Hooklying B march, with unilat lowering to mat 10 reps Hooklying hip ADD 20 reps Step ups 6' 10 reps bil Retro gait at counter 4x  down/back Standing toe raises 10x  08/04/21 Nustep L6 x 7 min  BAPS board Level 3 -DF/PF x 20, Inv/Ever x 20, CW circle x 20, CCW x 20  Active L ankle circles CW/CCW x 20 ea Sit to stands with overhead press weighted yellow ball 2 x 10 Cybex leg press  25# 2 x 10 Cybex heel raise 15# x 15 Cybex knee flexion 30# 2 x 10 Cybex knee extension 30# 1 x 10, 25# 1x10 Standing MFR to low back with large foam roller (squatting in rollator with foam against wall) MFR with ball to low back and gluteals Gait: SPC 160 ft   07/29/2021 Therapeutic Exercise: to improve strength and mobility.   Nustep L6 x 7 min  BAPS board Level 3 -DF/PF x 20, Inv/Ever x 20, CW circle x 20, CCW x 20  Cybex knee extension 30# 2 x 10  Cybex knee flexion 30# 2 x 10  Cybex leg press  25# 2 x 10   PATIENT EDUCATION: Education details: continue HEP.  Person educated: Patient Education method: Explanation, Demonstration, and Handouts Education comprehension: verbalized understanding and returned demonstration   HOME EXERCISE PROGRAM: Given prior  ASSESSMENT: Clinical Impression: Pt responded well to treatment. He still reports pain in L hip and reports that orthopedist he saw recently told him that he would need a TKA on L knee, he is unsure how he feels about that right now. He was able to complete all the interventions with instructions given to isolate the correct muscles. He reports 90% improvement in pain but his pain levels remain 6-7/10. Pt was w/o any complaints after session.     PT Short Term Goals - 04/06/21 1733       PT SHORT TERM GOAL #1  Title Patient to be independent with initial HEP.    Time 2    Period Weeks    Status Achieved   04/06/21- reports good compliance and daily performance.   Target Date 03/30/21              PT Long Term Goals - 07/05/21 1557       PT LONG TERM GOAL #1   Title Patient to be independent with advanced HEP.    Time 8    Period Weeks    Status On-going    05/03/21- met for current  07/05/2021- has lapsed, needs review   Target Date 08/30/21      PT LONG TERM GOAL #2   Title Patient will demonstrate improved endurance by completing 900' in 6MWT to access community.    Baseline 375' with 2 seated rest breaks needed, SPC used    Time 8    Period Weeks    Status On-going   05/03/21- 750' in 6 minutes with 4WRW, was able to complete 900' without rest break today.  07/05/2021- 450' with 4WRW, increased L foot pain.   Target Date 08/30/21      PT LONG TERM GOAL #3   Title Patient will score >19/24 on DGI to decrease risk of falls.    Baseline NT today    Time 8    Period Weeks    Status On-going   04/22/21- 15/24  07/05/2021- 14/24, no significant change.   Target Date 08/30/21      PT LONG TERM GOAL #4   Title Pt. will report 50% improvement in LLE pain.    Time 8    Period Weeks    Status On-going   05/31/21- pt reports 90% improvement but pain levels remain 6-7/10   Target Date 08/16/21      PT LONG TERM GOAL #5   Title Patient to score atleast 45/56 on BERG in order to decrease risk of falls.    Time 8    Period Weeks    Status Achieved   05/03/21- 47/56   Target Date 05/11/21      Additional Long Term Goals   Additional Long Term Goals Yes      PT LONG TERM GOAL #6   Title Demonstrate improved LE strength by completing 5x STS in <17 seconds.    Baseline 22 seconds    Time 8    Period Weeks    Status New    Target Date 08/30/21            PLAN:   PT Frequency Continue 2x / week       PT Duration 8 additional weeks  to 08/30/2021    PT Treatment/Interventions ADLs/Self Care Home Management;Gait training;Stair training;Functional mobility training;Therapeutic activities;Therapeutic exercise;Balance training;Neuromuscular re-education;Patient/family education;Manual techniques;Iontophoresis 2m/ml Dexamethasone;Moist Heat;Cryotherapy;Passive range of motion;Dry needling;Joint Manipulations;Aquatic Therapy     PT Next Visit Plan  Continue strengthening, balance, gait training with SBQC.            BArtist Pais PTA 08/17/2021, 2:55 PM

## 2021-08-19 ENCOUNTER — Ambulatory Visit: Payer: No Typology Code available for payment source | Admitting: Physical Therapy

## 2021-08-19 ENCOUNTER — Encounter: Payer: Self-pay | Admitting: Physical Therapy

## 2021-08-19 DIAGNOSIS — R262 Difficulty in walking, not elsewhere classified: Secondary | ICD-10-CM

## 2021-08-19 DIAGNOSIS — M6281 Muscle weakness (generalized): Secondary | ICD-10-CM

## 2021-08-19 DIAGNOSIS — R2681 Unsteadiness on feet: Secondary | ICD-10-CM

## 2021-08-19 DIAGNOSIS — G8929 Other chronic pain: Secondary | ICD-10-CM

## 2021-08-19 DIAGNOSIS — R2689 Other abnormalities of gait and mobility: Secondary | ICD-10-CM

## 2021-08-19 DIAGNOSIS — M25562 Pain in left knee: Secondary | ICD-10-CM | POA: Diagnosis not present

## 2021-08-19 NOTE — Therapy (Signed)
OUTPATIENT PHYSICAL THERAPY TREATMENT NOTE    Patient Name: Andrew Banks MRN: [id number] DOB:January 10, 1951, 71 y.o., male Today's Date: 08/19/2021  PCP: Felipa Eth, MD REFERRING PROVIDER: Jerolyn Shin. PA-C   PT End of Session - 08/19/21 1445     Visit Number 27    Number of Visits 30    Date for PT Re-Evaluation 08/30/21    Authorization Type [facility]    Progress Note Due on Visit 57    PT Start Time 1443    PT Stop Time 1529    PT Time Calculation (min) 46 min    Activity Tolerance Patient tolerated treatment well    Behavior During Therapy WFL for tasks assessed/performed                Past Medical History:  Diagnosis Date   Arthritis    Degenerative disc disease, lumbar    Hypertension    Liver transplanted [facility])    Skin cancer    Past Surgical History:  Procedure Laterality Date   liver transplant     There are no problems to display for this patient.   REFERRING DIAG: M79.606 (ICD-10-CM) - Leg pain  THERAPY DIAG:  Chronic pain of left knee  Muscle weakness (generalized)  Difficulty in walking, not elsewhere classified  Unsteadiness on feet  Chronic left-sided low back pain with left-sided sciatica  Other abnormalities of gait and mobility  Rationale for Evaluation and Treatment Rehabilitation  PERTINENT HISTORY: s/p ORIF Pelvic fx 11/12/19 s/p IMN L distal femur 08/12/2019 s/p L IM nailing femur  11/02/20 L foot drop  PRECAUTIONS: fall  SUBJECTIVE:  Patient does not like the idea of a knee replacement.  Shot hasn't helped knee a lot, "hurt more than helped, stirred up things in my back and hip"    PAIN:  Are you having pain? Yes: NPRS scale: 5/10 Pain location: LLE  OBJECTIVE: (objective measures completed at initial evaluation unless otherwise dated)         Transfers                                                                       05/03/2021 07/05/2021     Five time sit to stand comments  11 seconds  22 seconds              6  minute walk test results       Aerobic Endurance Distance Walked 750       Endurance additional comments with 4WRW, no rest breaks, increased LLE pain  600' with 4WRW              Balance      Balance Assessed Yes                Standardized Balance Assessment      Standardized Balance Assessment Berg Balance Test  DGI 14/24 - high risk of falls.  Used SPC     Five times sit to stand comments  11 seconds                Berg Balance Test      Sit to Stand Able to stand without using hands and stabilize independently       Standing  Unsupported Able to stand safely 2 minutes       Sitting with Back Unsupported but Feet Supported on Floor or Stool Able to sit safely and securely 2 minutes       Stand to Sit Sits safely with minimal use of hands       Transfers Able to transfer safely, minor use of hands       Standing Unsupported with Eyes Closed Able to stand 10 seconds safely       Standing Unsupported with Feet Together Able to place feet together independently and stand 1 minute safely       From Standing, Reach Forward with Outstretched Arm Can reach forward >12 cm safely (5")       From Standing Position, Pick up Object from Floor Able to pick up shoe, needs supervision       From Standing Position, Turn to Look Behind Over each Shoulder Looks behind from both sides and weight shifts well       Turn 360 Degrees Able to turn 360 degrees safely but slowly       Standing Unsupported, Alternately Place Feet on Step/Stool Able to stand independently and complete 8 steps >20 seconds       Standing Unsupported, One Foot in Front Able to plae foot ahead of the other independently and hold 30 seconds       Standing on One Leg Tries to lift leg/unable to hold 3 seconds but remains standing independently       Total Score 47       TODAY'S TREATMENT:  08/19/2021 Therapeutic Exercise: to improve strength and mobility.   Nustep L6 x 8 min  Ankle circles on ball, DF/PF, ankle inv/ever Toe  exercises Standing and correcting ankle eversion.  Neuromuscular Reeducation: to improve balance and stability. SBA for safety throughout.  Balance board - fontal plane side to side x 20, balance x 1 min without UE support. Sagittal plane x 20 (difficulty PF ankles without knee flexion), maintaining balance x 1 min without UE support. Ankle sways (church pews) x 20 on airex.    Manual Therapy: to decrease muscle spasm and pain and improve mobility.  Plantar fascia stretching, ankle mobs, 1st ray mobs to L foot.   08/16/21 Therapeutic Exercise: Nustep L6 x 7 min  Bridges 20 reps Hooklying B march, with unilat lowering to mat 10 reps Hooklying hip ADD 20 reps Step ups 6' 10 reps bil Retro gait at counter 4x down/back Standing toe raises 10x  08/04/21 Nustep L6 x 7 min  BAPS board Level 3 -DF/PF x 20, Inv/Ever x 20, CW circle x 20, CCW x 20  Active L ankle circles CW/CCW x 20 ea Sit to stands with overhead press weighted yellow ball 2 x 10 Cybex leg press  25# 2 x 10 Cybex toe press 15# x 15 Cybex knee flexion 30# 2 x 10 Cybex knee extension 30# 1 x 10, 25# 1x10 Standing MFR to low back with large foam roller (squatting in rollator with foam against wall) MFR with ball to low back and gluteals Gait: SPC 160 ft   PATIENT EDUCATION: Education details: continue HEP.  Person educated: Patient Education method: Explanation, Demonstration, and Handouts Education comprehension: verbalized understanding and returned demonstration   HOME EXERCISE PROGRAM: Given prior  ASSESSMENT: Clinical Impression: Pt reports increased generalized LLE pain after cortisone shot, discussed that this may be due to "steroid flare" and may improve.  He wanted to continue working on ankle  strength and mobility, so focus today was both on balance to improve ankle strategy as well as mobilization to L ankle, as very stiff due to increased tone and muscle imbalance from injury.  Reported that his ankle felt "more  alive" after interventions today.  Alcario Drought continues to demonstrate potential for improvement and would benefit from continued skilled therapy to address impairments.        PT Short Term Goals - 04/06/21 1733       PT SHORT TERM GOAL #1   Title Patient to be independent with initial HEP.    Time 2    Period Weeks    Status Achieved   04/06/21- reports good compliance and daily performance.   Target Date 03/30/21              PT Long Term Goals - 07/05/21 1557       PT LONG TERM GOAL #1   Title Patient to be independent with advanced HEP.    Time 8    Period Weeks    Status On-going   05/03/21- met for current  07/05/2021- has lapsed, needs review   Target Date 08/30/21      PT LONG TERM GOAL #2   Title Patient will demonstrate improved endurance by completing 900' in 6MWT to access community.    Baseline 375' with 2 seated rest breaks needed, SPC used    Time 8    Period Weeks    Status On-going   05/03/21- 750' in 6 minutes with 4WRW, was able to complete 900' without rest break today.  07/05/2021- 450' with 4WRW, increased L foot pain.   Target Date 08/30/21      PT LONG TERM GOAL #3   Title Patient will score >19/24 on DGI to decrease risk of falls.    Baseline NT today    Time 8    Period Weeks    Status On-going   04/22/21- 15/24  07/05/2021- 14/24, no significant change.   Target Date 08/30/21      PT LONG TERM GOAL #4   Title Pt. will report 50% improvement in LLE pain.    Time 8    Period Weeks    Status On-going   05/31/21- pt reports 90% improvement but pain levels remain 6-7/10   Target Date 08/16/21      PT LONG TERM GOAL #5   Title Patient to score atleast 45/56 on BERG in order to decrease risk of falls.    Time 8    Period Weeks    Status Achieved   05/03/21- 47/56   Target Date 05/11/21      Additional Long Term Goals   Additional Long Term Goals Yes      PT LONG TERM GOAL #6   Title Demonstrate improved LE strength by completing 5x STS in <17  seconds.    Baseline 22 seconds    Time 8    Period Weeks    Status New    Target Date 08/30/21            PLAN:   PT Frequency Continue 2x / week       PT Duration 8 additional weeks  to 08/30/2021    PT Treatment/Interventions ADLs/Self Care Home Management;Gait training;Stair training;Functional mobility training;Therapeutic activities;Therapeutic exercise;Balance training;Neuromuscular re-education;Patient/family education;Manual techniques;Iontophoresis 65m/ml Dexamethasone;Moist Heat;Cryotherapy;Passive range of motion;Dry needling;Joint Manipulations;Aquatic Therapy     PT Next Visit Plan Continue strengthening, balance, gait training with SBQC.  Rennie Natter, PT, DPT  08/19/2021, 3:52 PM

## 2021-08-23 ENCOUNTER — Ambulatory Visit: Payer: No Typology Code available for payment source

## 2021-08-23 DIAGNOSIS — R2681 Unsteadiness on feet: Secondary | ICD-10-CM

## 2021-08-23 DIAGNOSIS — G8929 Other chronic pain: Secondary | ICD-10-CM

## 2021-08-23 DIAGNOSIS — M25562 Pain in left knee: Secondary | ICD-10-CM | POA: Diagnosis not present

## 2021-08-23 DIAGNOSIS — M6281 Muscle weakness (generalized): Secondary | ICD-10-CM

## 2021-08-23 DIAGNOSIS — R262 Difficulty in walking, not elsewhere classified: Secondary | ICD-10-CM

## 2021-08-23 NOTE — Therapy (Signed)
OUTPATIENT PHYSICAL THERAPY TREATMENT NOTE    Patient Name: Andrew Banks MRN: [id number] DOB:Feb 18, 1950, 71 y.o., male Today's Date: 08/23/2021  PCP: Felipa Eth, MD REFERRING PROVIDER: Jerolyn Shin. PA-C   PT End of Session - 08/23/21 1446     Visit Number 28    Number of Visits 30    Date for PT Re-Evaluation 08/30/21    Authorization Type [facility]    Progress Note Due on Visit 32    PT Start Time 1403    PT Stop Time 1448    PT Time Calculation (min) 45 min    Activity Tolerance Patient tolerated treatment well    Behavior During Therapy WFL for tasks assessed/performed                 Past Medical History:  Diagnosis Date   Arthritis    Degenerative disc disease, lumbar    Hypertension    Liver transplanted [facility])    Skin cancer    Past Surgical History:  Procedure Laterality Date   liver transplant     There are no problems to display for this patient.   REFERRING DIAG: M79.606 (ICD-10-CM) - Leg pain  THERAPY DIAG:  Chronic pain of left knee  Muscle weakness (generalized)  Difficulty in walking, not elsewhere classified  Unsteadiness on feet  Rationale for Evaluation and Treatment Rehabilitation  PERTINENT HISTORY: s/p ORIF Pelvic fx 11/12/19 s/p IMN L distal femur 08/12/2019 s/p L IM nailing femur  11/02/20 L foot drop  PRECAUTIONS: fall  SUBJECTIVE:  That knee feels stiff today  PAIN:  Are you having pain? Yes: NPRS scale: 4/10 Pain location: LLE  - low back 5/10  OBJECTIVE: (objective measures completed at initial evaluation unless otherwise dated)         Transfers                                                                       05/03/2021 07/05/2021     Five time sit to stand comments  11 seconds  22 seconds              6 minute walk test results       Aerobic Endurance Distance Walked 750       Endurance additional comments with 4WRW, no rest breaks, increased LLE pain  600' with 4WRW              Balance      Balance  Assessed Yes                Standardized Balance Assessment      Standardized Balance Assessment Berg Balance Test  DGI 14/24 - high risk of falls.  Used SPC     Five times sit to stand comments  11 seconds                Berg Balance Test      Sit to Stand Able to stand without using hands and stabilize independently       Standing Unsupported Able to stand safely 2 minutes       Sitting with Back Unsupported but Feet Supported on Floor or Stool Able to sit safely and securely 2 minutes  Stand to Sit Sits safely with minimal use of hands       Transfers Able to transfer safely, minor use of hands       Standing Unsupported with Eyes Closed Able to stand 10 seconds safely       Standing Unsupported with Feet Together Able to place feet together independently and stand 1 minute safely       From Standing, Reach Forward with Outstretched Arm Can reach forward >12 cm safely (5")       From Standing Position, Pick up Object from Floor Able to pick up shoe, needs supervision       From Standing Position, Turn to Look Behind Over each Shoulder Looks behind from both sides and weight shifts well       Turn 360 Degrees Able to turn 360 degrees safely but slowly       Standing Unsupported, Alternately Place Feet on Step/Stool Able to stand independently and complete 8 steps >20 seconds       Standing Unsupported, One Foot in Front Able to plae foot ahead of the other independently and hold 30 seconds       Standing on One Leg Tries to lift leg/unable to hold 3 seconds but remains standing independently       Total Score 47       TODAY'S TREATMENT:  08/23/21 Therapeutic Exercise: Bike L1x6 min Step ups x 15 bil Standing lat pulls 2x10, 15#  Standing bicep curl x 15 5#  Gait Training: 360 ft with SPC 1 rest break required  08/19/2021 Therapeutic Exercise: to improve strength and mobility.   Nustep L6 x 8 min  Ankle circles on ball, DF/PF, ankle inv/ever Toe exercises Standing and  correcting ankle eversion.  Neuromuscular Reeducation: to improve balance and stability. SBA for safety throughout.  Balance board - fontal plane side to side x 20, balance x 1 min without UE support. Sagittal plane x 20 (difficulty PF ankles without knee flexion), maintaining balance x 1 min without UE support. Ankle sways (church pews) x 20 on airex.    Manual Therapy: to decrease muscle spasm and pain and improve mobility.  Plantar fascia stretching, ankle mobs, 1st ray mobs to L foot.   08/16/21 Therapeutic Exercise: Nustep L6 x 7 min  Bridges 20 reps Hooklying B march, with unilat lowering to mat 10 reps Hooklying hip ADD 20 reps Step ups 6' 10 reps bil Retro gait at counter 4x down/back Standing toe raises 10x     PATIENT EDUCATION: Education details: continue HEP.  Person educated: Patient Education method: Explanation, Demonstration, and Handouts Education comprehension: verbalized understanding and returned demonstration   HOME EXERCISE PROGRAM: Given prior  ASSESSMENT: Clinical Impression: Pt demonstrates improved score on 5x STS today and has met goal #6. We progressed gait training with SPC today, focusing on pace and proper gait mechanics and sequencing with SPC. Pt required seated rest 1x during gait training. He did require quite a bit of rest with the exercises today and needed many rest. Overall he was able to finish all exercises/interventions w/o being limited by pain.      PT Short Term Goals - 04/06/21 1733       PT SHORT TERM GOAL #1   Title Patient to be independent with initial HEP.    Time 2    Period Weeks    Status Achieved   04/06/21- reports good compliance and daily performance.   Target Date 03/30/21                PT Long Term Goals - 07/05/21 1557       PT LONG TERM GOAL #1   Title Patient to be independent with advanced HEP.    Time 8    Period Weeks    Status On-going   05/03/21- met for current  07/05/2021- has lapsed, needs review    Target Date 08/30/21      PT LONG TERM GOAL #2   Title Patient will demonstrate improved endurance by completing 900' in 6MWT to access community.    Baseline 375' with 2 seated rest breaks needed, SPC used    Time 8    Period Weeks    Status On-going   05/03/21- 750' in 6 minutes with 4WRW, was able to complete 900' without rest break today.  07/05/2021- 450' with 4WRW, increased L foot pain.   Target Date 08/30/21      PT LONG TERM GOAL #3   Title Patient will score >19/24 on DGI to decrease risk of falls.    Baseline NT today    Time 8    Period Weeks    Status On-going   04/22/21- 15/24  07/05/2021- 14/24, no significant change.   Target Date 08/30/21      PT LONG TERM GOAL #4   Title Pt. will report 50% improvement in LLE pain.    Time 8    Period Weeks    Status On-going   05/31/21- pt reports 90% improvement but pain levels remain 6-7/10   Target Date 08/16/21      PT LONG TERM GOAL #5   Title Patient to score atleast 45/56 on BERG in order to decrease risk of falls.    Time 8    Period Weeks    Status Achieved   05/03/21- 47/56   Target Date 05/11/21      Additional Long Term Goals   Additional Long Term Goals Yes      PT LONG TERM GOAL #6   Title Demonstrate improved LE strength by completing 5x STS in <17 seconds.    Baseline 22 seconds    Time 8    Period Weeks    Status Achieved - 9 sec (08/23/21)   Target Date 08/30/21            PLAN:   PT Frequency Continue 2x / week       PT Duration 8 additional weeks  to 08/30/2021    PT Treatment/Interventions ADLs/Self Care Home Management;Gait training;Stair training;Functional mobility training;Therapeutic activities;Therapeutic exercise;Balance training;Neuromuscular re-education;Patient/family education;Manual techniques;Iontophoresis 4mg/ml Dexamethasone;Moist Heat;Cryotherapy;Passive range of motion;Dry needling;Joint Manipulations;Aquatic Therapy     PT Next Visit Plan Continue strengthening, balance, gait training  with SBQC.            Braylin L Clark, PTA 08/23/2021, 2:55 PM    

## 2021-08-26 ENCOUNTER — Ambulatory Visit: Payer: No Typology Code available for payment source | Admitting: Physical Therapy

## 2021-08-26 ENCOUNTER — Encounter: Payer: Self-pay | Admitting: Physical Therapy

## 2021-08-26 DIAGNOSIS — R262 Difficulty in walking, not elsewhere classified: Secondary | ICD-10-CM

## 2021-08-26 DIAGNOSIS — M25562 Pain in left knee: Secondary | ICD-10-CM | POA: Diagnosis not present

## 2021-08-26 DIAGNOSIS — M6281 Muscle weakness (generalized): Secondary | ICD-10-CM

## 2021-08-26 DIAGNOSIS — R2681 Unsteadiness on feet: Secondary | ICD-10-CM

## 2021-08-26 DIAGNOSIS — R2689 Other abnormalities of gait and mobility: Secondary | ICD-10-CM

## 2021-08-26 DIAGNOSIS — G8929 Other chronic pain: Secondary | ICD-10-CM

## 2021-08-26 NOTE — Therapy (Signed)
OUTPATIENT PHYSICAL THERAPY TREATMENT NOTE  Progress Note Reporting Period 07/05/2021 to 08/26/2021  See note below for Objective Data and Assessment of Progress/Goals.      Patient Name: Andrew Banks MRN: [id number] DOB:April 12, 1950, 71 y.o., male Today's Date: 08/26/2021  PCP: Felipa Eth, MD REFERRING PROVIDER: Jerolyn Shin. PA-C   PT End of Session - 08/26/21 1403     Visit Number 29    Number of Visits 30    Date for PT Re-Evaluation 10/21/21    Authorization Type pending [facility] and [facility] authorization.    Progress Note Due on Visit 73    PT Start Time 1403    PT Stop Time 1445    PT Time Calculation (min) 42 min    Activity Tolerance Patient tolerated treatment well    Behavior During Therapy WFL for tasks assessed/performed                 Past Medical History:  Diagnosis Date   Arthritis    Degenerative disc disease, lumbar    Hypertension    Liver transplanted [facility])    Skin cancer    Past Surgical History:  Procedure Laterality Date   liver transplant     There are no problems to display for this patient.   REFERRING DIAG: M79.606 (ICD-10-CM) - Leg pain  THERAPY DIAG:  Muscle weakness (generalized)  Difficulty in walking, not elsewhere classified  Chronic pain of left knee  Unsteadiness on feet  Chronic left-sided low back pain with left-sided sciatica  Other abnormalities of gait and mobility  Rationale for Evaluation and Treatment Rehabilitation  PERTINENT HISTORY: s/p ORIF Pelvic fx 11/12/19 s/p IMN L distal femur 08/12/2019 s/p L IM nailing femur  11/02/20 L foot drop  PRECAUTIONS: fall  SUBJECTIVE:  Patient reports that overall he feels physical therapy has helped significantly.  He reported that people had been remarking on how much better he is walking.  He has not been wearing his AFO.  He continues to work on any new exercises in therapy throughout the week to try to make progress.  He still has a lot of L knee and  foot pain, and feels like hardware in his knee is loosening.  His wife also reports improved mobility at home.  He would like to continue PT as he is still not back to walking independent and pain is limiting.   PAIN:  Are you having pain? Yes: NPRS scale: 4-5/10 Pain location: LLE - especially L knee and foot, increases with walking    OBJECTIVE: (objective measures completed at initial evaluation unless otherwise dated)         Transfers                                                                       05/03/2021 07/05/2021 08/26/21    Five time sit to stand comments  11 seconds  22 seconds 9 seconds             6 minute walk test results       Aerobic Endurance Distance Walked 750       Endurance additional comments with 4WRW, no rest breaks, increased LLE pain  600' with 4WRW 450' with 5IDP  Balance      Balance Assessed Yes                Standardized Balance Assessment      Standardized Balance Assessment Berg Balance Test  DGI 14/24 - high risk of falls.  Used SPC DGI- 18/24    Five times sit to stand comments  11 seconds                Berg Balance Test      Sit to Stand Able to stand without using hands and stabilize independently       Standing Unsupported Able to stand safely 2 minutes       Sitting with Back Unsupported but Feet Supported on Floor or Stool Able to sit safely and securely 2 minutes       Stand to Sit Sits safely with minimal use of hands       Transfers Able to transfer safely, minor use of hands       Standing Unsupported with Eyes Closed Able to stand 10 seconds safely       Standing Unsupported with Feet Together Able to place feet together independently and stand 1 minute safely       From Standing, Reach Forward with Outstretched Arm Can reach forward >12 cm safely (5")       From Standing Position, Pick up Object from Floor Able to pick up shoe, needs supervision       From Standing Position, Turn to Look Behind Over each Shoulder Looks  behind from both sides and weight shifts well       Turn 360 Degrees Able to turn 360 degrees safely but slowly       Standing Unsupported, Alternately Place Feet on Step/Stool Able to stand independently and complete 8 steps >20 seconds       Standing Unsupported, One Foot in Front Able to plae foot ahead of the other independently and hold 30 seconds       Standing on One Leg Tries to lift leg/unable to hold 3 seconds but remains standing independently       Total Score 47      ANKLE ROM:  AROM Left * 08/26/21 Right 08/26/21  Dorsiflexion -2 10  Plantarflexion 35 30  Inversion 20 36  Eversion 10 30    GAIT:  6MWT : 450' with 4WRW   Reciprocal step, bil heel strike, no AFO   OBSERVATION:  Pes Cavus on LLE due to increased tone and weakness ankle dorsiflexors.    TODAY'S TREATMENT:  08/26/2021 Therapeutic Exercise: to improve strength and mobility.  Gait x 450' with 4WRW Ankle exercises - DF stretch with towel, ankle circles, ankle inv/ever with towel, HEP update Therapeutic Activity:  to assess progress towards goals.  DGI Ankle mobility  Recommendations for aquatic therapy Manual Therapy: to improve mobility. Plantar fascia stretching, ankle mobs, 1st ray mobs to L foot.   08/23/21 Therapeutic Exercise: Bike L1x6 min Step ups x 15 bil Standing lat pulls 2x10, 15#  Standing bicep curl x 15 5#  Gait Training: 360 ft with SPC 1 rest break required  08/19/2021 Therapeutic Exercise: to improve strength and mobility.   Nustep L6 x 8 min  Ankle circles on ball, DF/PF, ankle inv/ever Toe exercises Standing and correcting ankle eversion.  Neuromuscular Reeducation: to improve balance and stability. SBA for safety throughout.  Balance board - fontal plane side to side x 20, balance x 1 min without UE support.  Sagittal plane x 20 (difficulty PF ankles without knee flexion), maintaining balance x 1 min without UE support. Ankle sways (church pews) x 20 on airex.    Manual Therapy:  to decrease muscle spasm and pain and improve mobility.  Plantar fascia stretching, ankle mobs, 1st ray mobs to L foot.    PATIENT EDUCATION: Education details: progressed HEP for ankle.  Person educated: Patient Education method: Explanation, Demonstration, and Handouts Education comprehension: verbalized understanding and returned demonstration   HOME EXERCISE PROGRAM: Access Code: [email or web address] URL: [email or web address] Date: 08/26/2021 Prepared by: Glenetta Hew  Exercises - Long Sitting Calf Stretch with Strap  - 1 x daily - 7 x weekly - 3 sets - 10 reps - Seated Toe Raise  - 1 x daily - 7 x weekly - 3 sets - 10 reps - Ankle Inversion Eversion Towel Slide  - 1 x daily - 7 x weekly - 3 sets - 10 reps - Seated Ankle Eversion with Resistance  - 1 x daily - 7 x weekly - 3 sets - 10 reps - Seated Ankle Inversion Eversion PROM  - 1 x daily - 7 x weekly - 3 sets - 10 reps - Seated Ankle Alphabet  - 1 x daily - 7 x weekly - 3 sets - 10 reps  ASSESSMENT: Clinical Impression: Overall, Chaunce Winkels continues to make slow progress towards goals.  He is able to perform 5x STS in 9 seconds demonstrating improved functional LE strength, and also demonstrate improved L ankle ROM, able to ambulate with heel strike without his AFO.  His DGI has improved to 18/24 from 14/24, but this still places him at high risk of falls.  While gait mechanics are improving, he is still limited by L knee and foot pain, and reports ankle fatigue with gait.  Today discussed progress and goals, recommended considering joining [employer] [facility] [employer] to work on strength in pool now that skin has healed from MOHS surgery.  Progressed HEP for more ankle strengthening/AROM exercises.  Marjorie Lussier continues to demonstrate potential for improvement and would benefit from continued skilled therapy additional 8 week to 10/21/2021 to address impairments and reduce risk of falls with further injury.      GOALS: Goals  reviewed with patient? Yes  LONG TERM GOALS: Target date: 10/21/2021   Patient will be independent with advanced/ongoing HEP to improve outcomes and carryover.  Baseline: has HEP Goal status: IN PROGRESS  met for current, continue to progress.   2.  Patient will be able to ambulate 600' with LRAD with good safety to access community.  Baseline: 450' with 4WRW, 1 seated rest break needed.  Goal status: IN PROGRESS  3.  Patient will be able to step up/down curb safely with LRAD for safety with community ambulation.  Baseline: unable Goal status: INITIAL   4.  Patient will demonstrate at least 19/24 on DGI to improve gait stability and reduce risk for falls. Baseline:  04/22/21- 15/24  07/05/2021- 14/24 Goal status: IN PROGRESS 08/26/21- 18/24  5.  Patient will score 45/56 on Berg Balance test to demonstrate lower risk of falls. .  Baseline:  Goal status: MET 05/03/21- 47/56  6.  Patient will demonstrate improved functional LE strength as demonstrated by 5x STS < 17 seconds. Baseline: 22 seconds Goal status: MET 08/23/21 - 9 seconds   7.  Patient will report 59% on FOTO (patient reported outcome measure) to demonstrate improved functional ability. Baseline: 47% Goal status: IN PROGRESS  8.  Patient will  demonstrate active L ankle dorsiflexion and eversion to improve gait mechanics Baseline: see objective, limited Goal status: INITIAL  8.  Patient will report 50% improvement in LLE pain with gait. Baseline: 4-5/10 at rest, increases after 4 minutes of walking.  Goal status: IN PROGRESS  PLAN: PT FREQUENCY: 1-2x/week  PT DURATION: extended additional 8 weeks to 10/21/2021  PLANNED INTERVENTIONS: Therapeutic exercises, Therapeutic activity, Neuromuscular re-education, Balance training, Gait training, Patient/Family education, Self Care, Joint mobilization, Stair training, Orthotic/Fit training, Aquatic Therapy, Dry Needling, Electrical stimulation, Spinal mobilization, Cryotherapy,  Moist heat, Ionotophoresis 8m/ml Dexamethasone, Manual therapy, and Re-evaluation  PLAN FOR NEXT SESSION: FOTO update!!!, continue ankle strengthening, mobility, endurance training, LE strengthening.     ERennie Natter PT, DPT 08/26/2021, 4:43 PM

## 2021-10-11 ENCOUNTER — Ambulatory Visit: Payer: No Typology Code available for payment source | Attending: Emergency Medicine | Admitting: Physical Therapy

## 2021-10-11 ENCOUNTER — Encounter: Payer: Self-pay | Admitting: Physical Therapy

## 2021-10-11 DIAGNOSIS — M6281 Muscle weakness (generalized): Secondary | ICD-10-CM | POA: Diagnosis present

## 2021-10-11 DIAGNOSIS — R2681 Unsteadiness on feet: Secondary | ICD-10-CM | POA: Insufficient documentation

## 2021-10-11 DIAGNOSIS — R2689 Other abnormalities of gait and mobility: Secondary | ICD-10-CM | POA: Insufficient documentation

## 2021-10-11 DIAGNOSIS — M5442 Lumbago with sciatica, left side: Secondary | ICD-10-CM | POA: Insufficient documentation

## 2021-10-11 DIAGNOSIS — R262 Difficulty in walking, not elsewhere classified: Secondary | ICD-10-CM | POA: Diagnosis present

## 2021-10-11 DIAGNOSIS — G8929 Other chronic pain: Secondary | ICD-10-CM | POA: Insufficient documentation

## 2021-10-11 DIAGNOSIS — M25562 Pain in left knee: Secondary | ICD-10-CM | POA: Insufficient documentation

## 2021-10-11 NOTE — Therapy (Signed)
OUTPATIENT PHYSICAL THERAPY THORACOLUMBAR EVALUATION   Patient Name: Andrew Banks MRN: [id number] DOB:Mar 28, 1950, 71 y.o., male Today's Date: 10/11/2021   PT End of Session - 10/11/21 1322     Visit Number 1    Number of Visits 15    Date for PT Re-Evaluation 01/03/22    Authorization Type [facility]    Authorization - Number of Visits 15    PT Start Time 2671    PT Stop Time 1400    PT Time Calculation (min) 42 min    Activity Tolerance Patient tolerated treatment well    Behavior During Therapy WFL for tasks assessed/performed             Past Medical History:  Diagnosis Date   Arthritis    Degenerative disc disease, lumbar    Hypertension    Liver transplanted [facility])    Skin cancer    Past Surgical History:  Procedure Laterality Date   liver transplant     There are no problems to display for this patient.   PCP: Felipa Eth, MD  REFERRING PROVIDER: [facility]  REFERRING DIAG: M54.50 (ICD-10-CM) - Low back pain, unspecified  Rationale for Evaluation and Treatment Rehabilitation  THERAPY DIAG:  Chronic left-sided low back pain with left-sided sciatica  Muscle weakness (generalized)  Difficulty in walking, not elsewhere classified  Chronic pain of left knee  Other abnormalities of gait and mobility  ONSET DATE: chronic  SUBJECTIVE:                                                                                                                                                                                           SUBJECTIVE STATEMENT: Pt. Returns today after extended absence while working on reauthorization from [place].  He reports pain in pelvis in front and and low back.  He had fractured L5 in past.  He was told by spine surgeon nothing to do with it, it will flare up and just have to learn to deal with the pain.   He has been working on his exercises at home but feels therapy is very beneficial to continue to progress and strength.   He reports some hardware  coming out at his knee.  PERTINENT HISTORY:  History skin cancer, liver transplant, s/p ORIF Pelvic fx 11/12/19, s/p IMN L distal femur 08/12/2019, s/p L IM nailing femur  11/02/20, L foot drop, lumbar DDD,   PAIN:  Are you having pain? Yes: NPRS scale: 4-5/10 Pain location: L side low back, hip, pelvis, knee all the way to knee Pain description: constant ache low back/hip; sharp pain stabbing in lower leg,  Aggravating factors:  standing/walking long periods >45 min  Relieving factors: sitting down   PRECAUTIONS: Fall  WEIGHT BEARING RESTRICTIONS No  FALLS:  Has patient fallen in last 6 months? No  LIVING ENVIRONMENT: Lives with: lives with their spouse Lives in: House/apartment Stairs: Yes: External: 17 steps; on right going up, on left going up, and can reach both Has following equipment at home: Single point cane, Walker - 4 wheeled, Wheelchair (manual), Electronics engineer, and Grab bars  OCCUPATION: retired  PLOF: Independent with household mobility with device  PATIENT GOALS lesson pain, improve gait, sit to be able to travel longer distances   OBJECTIVE:   DIAGNOSTIC FINDINGS:  XR 12/22/20: 1.  Healing unstable traumatic U-shaped sacral fracture (spinopelvic dissociation) status post internal fixation with bilateral iliosacral screws at S1 and A left transsacral transiliac screw at S2. Both hips are located. No hardware complication. No gross change in alignment.   2.  Healed left ischiopubic ramus fracture.   3.  Healing peri-implant pertrochanteric left femoral fracture status post cephalomedullary nail fixation. No hardware is intact without loosening. No change in fracture alignment.   4.  Healed bicondylar distal femoral fracture (extending proximally to the distal third diaphysis) status post ORIF with lateral buttress plate and screws. No hardware complication or change in fracture alignment.   5.  Medial patellar fracture is is not visible on this examination and seen on the  CT left knee dated 11/11/2019.   6.  Healed nondisplaced fibular neck fracture.   7.  No new fractures.   8.  No joint misalignment (both sacroiliac, pubic symphysis, and both hips, and left knee).   9.  Status post embolization of multiple arteries (left L3 and L4 lumbar, right L4 lumbar, 2 branches of the left deep circumflex iliac artery, and bilateral iliolumbar arteries (per Interventional Radiology procedure note dated 11/10/2020) with multiple embolization coils in and around the anatomic pelvis. Vascular plug in the right groin.   10.  Status post ACL reconstruction. Femoral interference screw and partially imaged tibial screw are intact without loosening.   11.  Osteopenia.   12.  Moderate degenerative changes of the knee.   13.  Mild degenerative changes of the hips and pubic symphysis.   14.  Degenerative changes of the partially imaged lumbosacral spine.   15.  Partially imaged mesh tacks in the abdomen (along the ventral abdominal wall when correlating with prior CT abdomen/pelvis dated 11/16/2019).     PATIENT SURVEYS:  FOTO 33%, predicted outcome 43%  COGNITION:  Overall cognitive status: Within functional limits for tasks assessed     SENSATION: Not tested  MUSCLE LENGTH: Hamstrings: Right 90 deg; Left 90 deg  POSTURE: weight shift left  PALPATION: Tenderness L lower back, hypomobility lumbar spine.   LUMBAR ROM:   Active  AROM  eval  Flexion To feet, increased pain with return  Extension Limited 75%, increased pain  Right lateral flexion To knee, Increased pain L side  Left lateral flexion To knee, Increased pain L side  Right rotation No pain  Left rotation No pain    (Blank rows = not tested)  LOWER EXTREMITY ROM:     Active  Right eval Left eval  Hip flexion    Hip extension    Hip abduction    Hip adduction    Hip internal rotation 20 20  Hip external rotation 80 50  Knee flexion 125 100  Knee extension 0 Lacking 14   Ankle dorsiflexion    Ankle  plantarflexion    Ankle inversion    Ankle eversion     (Blank rows = not tested)  LOWER EXTREMITY MMT:    MMT Right * eval Left * eval  Hip flexion 5 5  Hip extension    Hip abduction 5 5  Hip adduction 5 5  Knee flexion 5 4  Knee extension 5 5  Ankle dorsiflexion 5 3  Ankle plantarflexion 5 3  Ankle inversion 5 3  Ankle eversion 5 2   (Blank rows = not tested) *reported increased pain with all resisted movements bil.   LUMBAR SPECIAL TESTS:  Straight leg raise test: Negative  FUNCTIONAL TESTS:  5 times sit to stand: 10.4 seconds  GAIT: Distance walked: 50' Assistive device utilized: Environmental consultant - 4 wheeled Level of assistance: Modified independence Comments: no longer wears AFO, L ankle tends to intoe  TODAY'S TREATMENT  NA   PATIENT EDUCATION:  Education details: plan of care Person educated: Patient Education method: Explanation Education comprehension: verbalized understanding   HOME EXERCISE PROGRAM: [email or web address]  ASSESSMENT:  CLINICAL IMPRESSION: Patient is a 71 y.o. male who was seen today for physical therapy evaluation and treatment for low back pain and LLE pain.  He has extensive medical history including L pelvic and femur fractures and hardware through pelvis, femur to knee, corresponding to areas of pain.   He was last seen in PT in July, but due to insurance authorization issues has not been able to return until now.  He demonstrates increased LBP with extension, difficulty walking/standing prolonged periods due to pain, but also sitting for extended periods increases pain.  His L foot drop has been improving and he is no longer wearing AFO on L, and demonstrates good heel strike while walking on L, but still has weakness in L ankle evertors.  He also demonstrates impaired posture, unable to fully extend L knee due to hardware and injury, causing weight shift to LLE.  He may benefit from trial of heel lift to try improve alignment.  Alcario Drought would  benefit from skilled physical therapy to continue to improve LE strength, mobility, and decrease pain in order to improve QOL and allow travel.  Recommended making appointment with orthopedist to evaluate hardware at L knee loosening.    OBJECTIVE IMPAIRMENTS Abnormal gait, decreased activity tolerance, decreased balance, decreased endurance, decreased mobility, difficulty walking, decreased ROM, decreased strength, hypomobility, increased fascial restrictions, increased muscle spasms, impaired flexibility, postural dysfunction, and pain.   ACTIVITY LIMITATIONS carrying, lifting, bending, sitting, standing, sleeping, stairs, transfers, dressing, and locomotion level  PARTICIPATION LIMITATIONS: meal prep, cleaning, laundry, driving, shopping, community activity, yard work, and travel  Prentice Age, Past/current experiences, Time since onset of injury/illness/exacerbation, and 3+ comorbidities: COPD, chronic LBP, liver transplant, skin cancer, HTN, osteoporosis, history falls with injury, and surgical hardware  are also affecting patient's functional outcome.   REHAB POTENTIAL: Good  CLINICAL DECISION MAKING: Evolving/moderate complexity  EVALUATION COMPLEXITY: Moderate   GOALS: Goals reviewed with patient? Yes  SHORT TERM GOALS: Target date: 11/01/2021   Patient will be independent with initial HEP.  Baseline:  Goal status: INITIAL   LONG TERM GOALS: Target date: 01/03/2022    Patient will be independent with advanced/ongoing HEP to improve outcomes and carryover.  Baseline:  Goal status: INITIAL  2.  Patient will report 50% improvement in low back pain to improve QOL.  Baseline:  Goal status: INITIAL  3.  Patient will demonstrate full pain free lumbar ROM to perform ADLs.  Baseline: increased pain with extension and SB Goal status: INITIAL  4.  Patient will demonstrate improved functional strength and balance as demonstrated by DGI >19/24 to decrease risk of falls.   Baseline: 18/24 on 08/26/21 Goal status: INITIAL  5.  Patient will report 43% on lumbar FOTO to demonstrate improved functional ability.  Baseline: 33% Goal status: INITIAL   6.  Patient will tolerate 15 min of  walking to access community.  Baseline: increased pain after 5-10 min Goal status: INITIAL  7.  Patient will be able to tolerate sitting >1 hour without increased LLE pain or swelling in order to travel.  Baseline: 30 min Goal status: INITIAL    PLAN: PT FREQUENCY: 1-2x/week  PT DURATION: 12 weeks  PLANNED INTERVENTIONS: Therapeutic exercises, Therapeutic activity, Neuromuscular re-education, Balance training, Gait training, Patient/Family education, Self Care, Joint mobilization, Stair training, Orthotic/Fit training, DME instructions, Aquatic Therapy, Dry Needling, Electrical stimulation, Spinal mobilization, Cryotherapy, Moist heat, Manual therapy, and Re-evaluation.  PLAN FOR NEXT SESSION: 6MWT, core strengthening for back, check leg length discrepancy   Rennie Natter, PT, DPT  10/11/2021, 5:11 PM

## 2021-10-20 ENCOUNTER — Ambulatory Visit: Payer: No Typology Code available for payment source

## 2021-10-25 ENCOUNTER — Ambulatory Visit: Payer: No Typology Code available for payment source | Admitting: Physical Therapy

## 2021-10-25 ENCOUNTER — Encounter: Payer: Self-pay | Admitting: Physical Therapy

## 2021-10-25 DIAGNOSIS — G8929 Other chronic pain: Secondary | ICD-10-CM

## 2021-10-25 DIAGNOSIS — R2681 Unsteadiness on feet: Secondary | ICD-10-CM

## 2021-10-25 DIAGNOSIS — R2689 Other abnormalities of gait and mobility: Secondary | ICD-10-CM

## 2021-10-25 DIAGNOSIS — R262 Difficulty in walking, not elsewhere classified: Secondary | ICD-10-CM

## 2021-10-25 DIAGNOSIS — M5442 Lumbago with sciatica, left side: Secondary | ICD-10-CM | POA: Diagnosis not present

## 2021-10-25 DIAGNOSIS — M6281 Muscle weakness (generalized): Secondary | ICD-10-CM

## 2021-10-25 NOTE — Patient Instructions (Signed)

## 2021-10-25 NOTE — Therapy (Signed)
OUTPATIENT PHYSICAL THERAPY TREATMENT   Patient Name: Hulet Ehrmann MRN: [id number] DOB:03-02-1950, 71 y.o., male Today's Date: 10/25/2021   PT End of Session - 10/25/21 1315     Visit Number 2    Number of Visits 15    Date for PT Re-Evaluation 01/03/22    Authorization Type [facility], [employer]    Authorization Time Period 10/11/21-01/03/2022    Authorization - Number of Visits 15    PT Start Time 1316    PT Stop Time 1400    PT Time Calculation (min) 44 min    Activity Tolerance Patient tolerated treatment well    Behavior During Therapy WFL for tasks assessed/performed             Past Medical History:  Diagnosis Date   Arthritis    Degenerative disc disease, lumbar    Hypertension    Liver transplanted [facility])    Skin cancer    Past Surgical History:  Procedure Laterality Date   liver transplant     There are no problems to display for this patient.   PCP: Felipa Eth, MD  REFERRING PROVIDER: [facility]  REFERRING DIAG: M54.50 (ICD-10-CM) - Low back pain, unspecified  Rationale for Evaluation and Treatment Rehabilitation  THERAPY DIAG:  Chronic left-sided low back pain with left-sided sciatica  Muscle weakness (generalized)  Difficulty in walking, not elsewhere classified  Chronic pain of left knee  Other abnormalities of gait and mobility  Unsteadiness on feet  ONSET DATE: chronic  SUBJECTIVE:                                                                                                                                                                                           SUBJECTIVE STATEMENT: Pt. Says he feels like "I haven't been in PT for a while, will need to ease into it."  Reports L knee hurts today.  Saw pain management last week "they just ask how much medicine I've been taking."  Denies new falls.   PERTINENT HISTORY:  History skin cancer, liver transplant, s/p ORIF Pelvic fx 11/12/19, s/p IMN L distal femur 08/12/2019, s/p L IM nailing femur   11/02/20, L foot drop, lumbar DDD,   PAIN:  Are you having pain? Yes: NPRS scale: 5/10 Pain location: L side low back, hip, pelvis, 6-7/10 L knee, L foot 5/10  Pain description: constant ache low back/hip; sharp pain stabbing in lower leg,  Aggravating factors: standing/walking long periods >45 min  Relieving factors: sitting down   PRECAUTIONS: Fall  WEIGHT BEARING RESTRICTIONS No  FALLS:  Has patient fallen in last 6 months? No  LIVING ENVIRONMENT: Lives  with: lives with their spouse Lives in: House/apartment Stairs: Yes: External: 17 steps; on right going up, on left going up, and can reach both Has following equipment at home: Single point cane, Walker - 4 wheeled, Wheelchair (manual), Electronics engineer, and Grab bars  OCCUPATION: retired  PLOF: Independent with household mobility with device  PATIENT GOALS lesson pain, improve gait, sit to be able to travel longer distances   OBJECTIVE:   DIAGNOSTIC FINDINGS:  XR 12/22/20: 1.  Healing unstable traumatic U-shaped sacral fracture (spinopelvic dissociation) status post internal fixation with bilateral iliosacral screws at S1 and A left transsacral transiliac screw at S2. Both hips are located. No hardware complication. No gross change in alignment.   2.  Healed left ischiopubic ramus fracture.   3.  Healing peri-implant pertrochanteric left femoral fracture status post cephalomedullary nail fixation. No hardware is intact without loosening. No change in fracture alignment.   4.  Healed bicondylar distal femoral fracture (extending proximally to the distal third diaphysis) status post ORIF with lateral buttress plate and screws. No hardware complication or change in fracture alignment.   5.  Medial patellar fracture is is not visible on this examination and seen on the CT left knee dated 11/11/2019.   6.  Healed nondisplaced fibular neck fracture.   7.  No new fractures.   8.  No joint misalignment (both sacroiliac, pubic symphysis, and  both hips, and left knee).   9.  Status post embolization of multiple arteries (left L3 and L4 lumbar, right L4 lumbar, 2 branches of the left deep circumflex iliac artery, and bilateral iliolumbar arteries (per Interventional Radiology procedure note dated 11/10/2020) with multiple embolization coils in and around the anatomic pelvis. Vascular plug in the right groin.   10.  Status post ACL reconstruction. Femoral interference screw and partially imaged tibial screw are intact without loosening.   11.  Osteopenia.   12.  Moderate degenerative changes of the knee.   13.  Mild degenerative changes of the hips and pubic symphysis.   14.  Degenerative changes of the partially imaged lumbosacral spine.   15.  Partially imaged mesh tacks in the abdomen (along the ventral abdominal wall when correlating with prior CT abdomen/pelvis dated 11/16/2019).     PATIENT SURVEYS:  FOTO 33%, predicted outcome 43%  COGNITION:  Overall cognitive status: Within functional limits for tasks assessed     SENSATION: Not tested  MUSCLE LENGTH: Hamstrings: Right 90 deg; Left 90 deg  POSTURE: weight shift left  PALPATION: Tenderness L lower back, hypomobility lumbar spine.   LUMBAR ROM:   Active  AROM  eval  Flexion To feet, increased pain with return  Extension Limited 75%, increased pain  Right lateral flexion To knee, Increased pain L side  Left lateral flexion To knee, Increased pain L side  Right rotation No pain  Left rotation No pain    (Blank rows = not tested)  LOWER EXTREMITY ROM:     Active  Right eval Left eval  Hip flexion    Hip extension    Hip abduction    Hip adduction    Hip internal rotation 20 20  Hip external rotation 80 50  Knee flexion 125 100  Knee extension 0 Lacking 14   Ankle dorsiflexion    Ankle plantarflexion    Ankle inversion    Ankle eversion     (Blank rows = not tested)  LOWER EXTREMITY MMT:    MMT Right * eval Left * eval  Hip flexion 5 5  Hip  extension    Hip abduction 5 5  Hip adduction 5 5  Knee flexion 5 4  Knee extension 5 5  Ankle dorsiflexion 5 3  Ankle plantarflexion 5 3  Ankle inversion 5 3  Ankle eversion 5 2   (Blank rows = not tested) *reported increased pain with all resisted movements bil.   LUMBAR SPECIAL TESTS:  Straight leg raise test: Negative  FUNCTIONAL TESTS:  5 times sit to stand: 10.4 seconds  GAIT: Distance walked: 50' Assistive device utilized: Environmental consultant - 4 wheeled Level of assistance: Modified independence Comments: no longer wears AFO, L ankle tends to intoe  TODAY'S TREATMENT  10/25/2021 Therapeutic Exercise: to improve strength and mobility.  Demo, verbal and tactile cues throughout for technique. Nustep L5 x 8 min UE and LE Supine pelvic tilts x 10 Supine bridges x 10 Supine marching x 10 Supine dead bug x 10 - cues to not flex head, keep relaxed.   Manual Therapy: to decrease muscle spasm and pain and improve mobility  STM to L vastus, skilled palpation and monitoring during dry needling.  STM/TPR to L lumbar paraspinals, QL, piriformis and glutes in S/L Trigger Point Dry-Needling  Treatment instructions: Expect mild to moderate muscle soreness.  Patient verbalized understanding of these instructions and education.  Patient Consent Given: Yes Education handout provided: Yes Muscles treated: L vastus lateralis Treatment response/outcome: Twitch Response Elicited and Palpable Increase in Muscle Length   PATIENT EDUCATION:  Education details: dry needling, HEP update Person educated: Patient Education method: Explanation, Demonstration, Verbal cues, and Handouts Education comprehension: verbalized understanding and returned demonstration   HOME EXERCISE PROGRAM: Access Code: [email or web address] URL: [email or web address] Date: 10/25/2021 Prepared by: Glenetta Hew  Exercises Added  - Supine Posterior Pelvic Tilt  - 1 x daily - 7 x weekly - 2 sets - 10 reps - Supine  March with Posterior Pelvic Tilt  - 1 x daily - 7 x weekly - 2 sets - 10 reps - Dead Bug  - 1 x daily - 7 x weekly - 2 sets - 10 reps - Supine Bridge  - 1 x daily - 7 x weekly - 2 sets - 10 reps  ASSESSMENT:  CLINICAL IMPRESSION: Carloyn Manner reported more L knee pain and LBP today.  He demonstrated snapping of ITBand over L knee with extension, after discussion consented to trial of TrDN to vastus lateralis to decrease tightness, tolerated well and reported decreased pain and no snapping afterwards.  He tolerated core strengthening exercises today in supine but still fatigued very quickly.  Manual therapy to left side low back in sidelying decreased pain and muscle tightness.  Updated HEP to start strengthening, to perform to tolerance and rest.   Johnell Comings continues to demonstrate potential for improvement and would benefit from continued skilled therapy to address impairments.      OBJECTIVE IMPAIRMENTS Abnormal gait, decreased activity tolerance, decreased balance, decreased endurance, decreased mobility, difficulty walking, decreased ROM, decreased strength, hypomobility, increased fascial restrictions, increased muscle spasms, impaired flexibility, postural dysfunction, and pain.   ACTIVITY LIMITATIONS carrying, lifting, bending, sitting, standing, sleeping, stairs, transfers, dressing, and locomotion level  PARTICIPATION LIMITATIONS: meal prep, cleaning, laundry, driving, shopping, community activity, yard work, and travel  [place] Age, Past/current experiences, Time since onset of injury/illness/exacerbation, and 3+ comorbidities: COPD, chronic LBP, liver transplant, skin cancer, HTN, osteoporosis, history falls with injury, and surgical hardware  are also affecting patient's functional outcome.   REHAB POTENTIAL:  Good  CLINICAL DECISION MAKING: Evolving/moderate complexity  EVALUATION COMPLEXITY: Moderate   GOALS: Goals reviewed with patient? Yes  SHORT  TERM GOALS: Target date: 11/01/2021   Patient will be independent with initial HEP.  Baseline:  HEP given 10/25/21 Goal status: IN PROGRESS   LONG TERM GOALS: Target date: 01/03/2022    Patient will be independent with advanced/ongoing HEP to improve outcomes and carryover.  Baseline:  Goal status: IN PROGRESS  2.  Patient will report 50% improvement in low back pain to improve QOL.  Baseline:  Goal status: IN PROGRESS  3.  Patient will demonstrate full pain free lumbar ROM to perform ADLs.   Baseline: increased pain with extension and SB Goal status: IN PROGRESS  4.  Patient will demonstrate improved functional strength and balance as demonstrated by DGI >19/24 to decrease risk of falls.  Baseline: 18/24 on 08/26/21 Goal status: IN PROGRESS  5.  Patient will report 43% on lumbar FOTO to demonstrate improved functional ability.  Baseline: 33% Goal status: IN PROGRESS   6.  Patient will tolerate 15 min of  walking to access community.  Baseline: increased pain after 5-10 min Goal status: IN PROGRESS  7.  Patient will be able to tolerate sitting >1 hour without increased LLE pain or swelling in order to travel.  Baseline: 30 min Goal status: IN PROGRESS    PLAN: PT FREQUENCY: 1-2x/week  PT DURATION: 12 weeks  PLANNED INTERVENTIONS: Therapeutic exercises, Therapeutic activity, Neuromuscular re-education, Balance training, Gait training, Patient/Family education, Self Care, Joint mobilization, Stair training, Orthotic/Fit training, DME instructions, Aquatic Therapy, Dry Needling, Electrical stimulation, Spinal mobilization, Cryotherapy, Moist heat, Manual therapy, and Re-evaluation.  PLAN FOR NEXT SESSION: 6MWT, core strengthening for back, check leg length discrepancy   Rennie Natter, PT, DPT  10/25/2021, 2:16 PM

## 2021-11-01 ENCOUNTER — Encounter: Payer: No Typology Code available for payment source | Admitting: Physical Therapy

## 2021-11-08 ENCOUNTER — Encounter: Payer: Self-pay | Admitting: Physical Therapy

## 2021-11-08 ENCOUNTER — Ambulatory Visit: Payer: No Typology Code available for payment source | Attending: Emergency Medicine | Admitting: Physical Therapy

## 2021-11-08 DIAGNOSIS — M5442 Lumbago with sciatica, left side: Secondary | ICD-10-CM | POA: Diagnosis present

## 2021-11-08 DIAGNOSIS — R2681 Unsteadiness on feet: Secondary | ICD-10-CM

## 2021-11-08 DIAGNOSIS — R262 Difficulty in walking, not elsewhere classified: Secondary | ICD-10-CM

## 2021-11-08 DIAGNOSIS — G8929 Other chronic pain: Secondary | ICD-10-CM

## 2021-11-08 DIAGNOSIS — M6281 Muscle weakness (generalized): Secondary | ICD-10-CM

## 2021-11-08 DIAGNOSIS — M25562 Pain in left knee: Secondary | ICD-10-CM | POA: Diagnosis present

## 2021-11-08 DIAGNOSIS — R2689 Other abnormalities of gait and mobility: Secondary | ICD-10-CM | POA: Diagnosis present

## 2021-11-08 NOTE — Therapy (Signed)
OUTPATIENT PHYSICAL THERAPY TREATMENT   Patient Name: Andrew Banks MRN: [id number] DOB:[birth year], 71 y.o., male Today's Date: 11/08/2021   PT End of Session - 11/08/21 1317     Visit Number 3    Number of Visits 15    Date for PT Re-Evaluation 01/03/22    Authorization Type [facility], [employer]    Authorization Time Period 10/11/21-01/03/2022    Authorization - Visit Number 3    Authorization - Number of Visits 15    Progress Note Due on Visit 10    PT Start Time 1315    PT Stop Time 1359    PT Time Calculation (min) 44 min             Past Medical History:  Diagnosis Date   Arthritis    Degenerative disc disease, lumbar    Hypertension    Liver transplanted [facility])    Skin cancer    Past Surgical History:  Procedure Laterality Date   liver transplant     There are no problems to display for this patient.   PCP: Felipa Eth, MD  REFERRING PROVIDER: [facility]  REFERRING DIAG: M54.50 (ICD-10-CM) - Low back pain, unspecified  Rationale for Evaluation and Treatment Rehabilitation  THERAPY DIAG:  Chronic left-sided low back pain with left-sided sciatica  Muscle weakness (generalized)  Unsteadiness on feet  Difficulty in walking, not elsewhere classified  Chronic pain of left knee  Other abnormalities of gait and mobility  ONSET DATE: chronic  SUBJECTIVE:                                                                                                                                                                                           SUBJECTIVE STATEMENT: Back feels "arthritic."  Its been aggravated with the weather.   Dry needling L quad helped 3-4 days which was really good.   PERTINENT HISTORY:  History skin cancer, liver transplant, s/p ORIF Pelvic fx 11/12/19, s/p IMN L distal femur 08/12/2019, s/p L IM nailing femur  11/02/20, L foot drop, lumbar DDD,   PAIN:  Are you having pain? Yes: NPRS scale: 6/10 Pain location: L side low back, hip, pelvis, 6-7/10  L knee, L foot 5/10  Pain description: constant ache low back/hip; sharp pain stabbing in lower leg,  Aggravating factors: standing/walking long periods >45 min  Relieving factors: sitting down   PRECAUTIONS: Fall  WEIGHT BEARING RESTRICTIONS No  FALLS:  Has patient fallen in last 6 months? No  LIVING ENVIRONMENT: Lives with: lives with their spouse Lives in: House/apartment Stairs: Yes: External: 17 steps; on right going up, on left going up, and can  reach both Has following equipment at home: Single point cane, Walker - 4 wheeled, Wheelchair (manual), Electronics engineer, and Grab bars  OCCUPATION: retired  PLOF: Independent with household mobility with device  PATIENT GOALS lesson pain, improve gait, sit to be able to travel longer distances   OBJECTIVE:   DIAGNOSTIC FINDINGS:  XR 12/22/20: 1.  Healing unstable traumatic U-shaped sacral fracture (spinopelvic dissociation) status post internal fixation with bilateral iliosacral screws at S1 and A left transsacral transiliac screw at S2. Both hips are located. No hardware complication. No gross change in alignment.   2.  Healed left ischiopubic ramus fracture.   3.  Healing peri-implant pertrochanteric left femoral fracture status post cephalomedullary nail fixation. No hardware is intact without loosening. No change in fracture alignment.   4.  Healed bicondylar distal femoral fracture (extending proximally to the distal third diaphysis) status post ORIF with lateral buttress plate and screws. No hardware complication or change in fracture alignment.   5.  Medial patellar fracture is is not visible on this examination and seen on the CT left knee dated 11/11/2019.   6.  Healed nondisplaced fibular neck fracture.   7.  No new fractures.   8.  No joint misalignment (both sacroiliac, pubic symphysis, and both hips, and left knee).   9.  Status post embolization of multiple arteries (left L3 and L4 lumbar, right L4 lumbar, 2 branches of the left  deep circumflex iliac artery, and bilateral iliolumbar arteries (per Interventional Radiology procedure note dated 11/10/2020) with multiple embolization coils in and around the anatomic pelvis. Vascular plug in the right groin.   10.  Status post ACL reconstruction. Femoral interference screw and partially imaged tibial screw are intact without loosening.   11.  Osteopenia.   12.  Moderate degenerative changes of the knee.   13.  Mild degenerative changes of the hips and pubic symphysis.   14.  Degenerative changes of the partially imaged lumbosacral spine.   15.  Partially imaged mesh tacks in the abdomen (along the ventral abdominal wall when correlating with prior CT abdomen/pelvis dated 11/16/2019).     PATIENT SURVEYS:  FOTO 33%, predicted outcome 43%  COGNITION:  Overall cognitive status: Within functional limits for tasks assessed     SENSATION: Not tested  MUSCLE LENGTH: Hamstrings: Right 90 deg; Left 90 deg  POSTURE: weight shift left  PALPATION: Tenderness L lower back, hypomobility lumbar spine.   LUMBAR ROM:   Active  AROM  eval  Flexion To feet, increased pain with return  Extension Limited 75%, increased pain  Right lateral flexion To knee, Increased pain L side  Left lateral flexion To knee, Increased pain L side  Right rotation No pain  Left rotation No pain    (Blank rows = not tested)  LOWER EXTREMITY ROM:     Active  Right eval Left eval  Hip flexion    Hip extension    Hip abduction    Hip adduction    Hip internal rotation 20 20  Hip external rotation 80 50  Knee flexion 125 100  Knee extension 0 Lacking 14   Ankle dorsiflexion    Ankle plantarflexion    Ankle inversion    Ankle eversion     (Blank rows = not tested)  LOWER EXTREMITY MMT:    MMT Right * eval Left * eval  Hip flexion 5 5  Hip extension    Hip abduction 5 5  Hip adduction 5 5  Knee flexion  5 4  Knee extension 5 5  Ankle dorsiflexion 5 3  Ankle plantarflexion 5 3   Ankle inversion 5 3  Ankle eversion 5 2   (Blank rows = not tested) *reported increased pain with all resisted movements bil.   LUMBAR SPECIAL TESTS:  Straight leg raise test: Negative  FUNCTIONAL TESTS:  5 times sit to stand: 10.4 seconds  GAIT: Distance walked: 50' Assistive device utilized: Environmental consultant - 4 wheeled Level of assistance: Modified independence Comments: no longer wears AFO, L ankle tends to intoe  TODAY'S TREATMENT  11/08/2021 Therapeutic Exercise: to improve strength and mobility.  Demo, verbal and tactile cues throughout for technique. L6 x 8 min UE and LE Ankle BAPS L1 Left ankle -DF/PF x 20, Inv/Ever x 20, CW x 10, CCW x 10  Ankle sways  Ankle sways on [employer] - very challenging Supine bridges x 15 Manual Therapy: to decrease muscle spasm and pain and improve mobility  STM to L lumbar paraspinals, QL stretch in Sidelying STM/TPR to L peroneals, skilled palpation and monitoring during dry needling. Trigger Point Dry-Needling  Treatment instructions: Expect mild to moderate muscle soreness. S/S of pneumothorax if dry needled over a lung field, and to seek immediate medical attention should they occur. Patient verbalized understanding of these instructions and education.  Patient Consent Given: Yes Education handout provided: Previously provided Muscles treated: L peroneals Electrical stimulation performed: No Parameters: N/A Treatment response/outcome: Twitch Response Elicited and Palpable Increase in Muscle Length    10/25/2021 Therapeutic Exercise: to improve strength and mobility.  Demo, verbal and tactile cues throughout for technique. Nustep L5 x 8 min UE and LE Supine pelvic tilts x 10 Supine bridges x 10 Supine marching x 10 Supine dead bug x 10 - cues to not flex head, keep relaxed.   Manual Therapy: to decrease muscle spasm and pain and improve mobility  STM to L vastus, skilled palpation and monitoring during dry needling.  STM/TPR to L lumbar  paraspinals, QL, piriformis and glutes in S/L Trigger Point Dry-Needling  Treatment instructions: Expect mild to moderate muscle soreness.  Patient verbalized understanding of these instructions and education.  Patient Consent Given: Yes Education handout provided: Yes Muscles treated: L vastus lateralis Treatment response/outcome: Twitch Response Elicited and Palpable Increase in Muscle Length   PATIENT EDUCATION:  Education details: dry needling, HEP update Person educated: Patient Education method: Explanation, Demonstration, Verbal cues, and Handouts Education comprehension: verbalized understanding and returned demonstration   HOME EXERCISE PROGRAM: Access Code: [email or web address] URL: [email or web address] Date: 10/25/2021 Prepared by: Glenetta Hew  Exercises Added  - Supine Posterior Pelvic Tilt  - 1 x daily - 7 x weekly - 2 sets - 10 reps - Supine March with Posterior Pelvic Tilt  - 1 x daily - 7 x weekly - 2 sets - 10 reps - Dead Bug  - 1 x daily - 7 x weekly - 2 sets - 10 reps - Supine Bridge  - 1 x daily - 7 x weekly - 2 sets - 10 reps  ASSESSMENT:  CLINICAL IMPRESSION: Andrew Banks demonstrates continued improvement of L ankle strength, but reported increased pain with eversion, after discussion consented to trial of dry needling to L peroneals, reported immediate improvement in pain.  He had difficulty with bridges today due to HS cramping.    Andrew Banks continues to demonstrate potential for improvement and would benefit from continued skilled therapy to address impairments.    OBJECTIVE IMPAIRMENTS Abnormal gait, decreased activity tolerance, decreased  balance, decreased endurance, decreased mobility, difficulty walking, decreased ROM, decreased strength, hypomobility, increased fascial restrictions, increased muscle spasms, impaired flexibility, postural dysfunction, and pain.   ACTIVITY LIMITATIONS carrying, lifting, bending,  sitting, standing, sleeping, stairs, transfers, dressing, and locomotion level  PARTICIPATION LIMITATIONS: meal prep, cleaning, laundry, driving, shopping, community activity, yard work, and travel  [place] Age, Past/current experiences, Time since onset of injury/illness/exacerbation, and 3+ comorbidities: COPD, chronic LBP, liver transplant, skin cancer, HTN, osteoporosis, history falls with injury, and surgical hardware  are also affecting patient's functional outcome.   REHAB POTENTIAL: Good  CLINICAL DECISION MAKING: Evolving/moderate complexity  EVALUATION COMPLEXITY: Moderate   GOALS: Goals reviewed with patient? Yes  SHORT TERM GOALS: Target date: 11/01/2021   Patient will be independent with initial HEP.  Baseline:  HEP given 10/25/21 Goal status: IN PROGRESS   LONG TERM GOALS: Target date: 01/03/2022    Patient will be independent with advanced/ongoing HEP to improve outcomes and carryover.  Baseline:  Goal status: IN PROGRESS  2.  Patient will report 50% improvement in low back pain to improve QOL.  Baseline:  Goal status: IN PROGRESS  3.  Patient will demonstrate full pain free lumbar ROM to perform ADLs.   Baseline: increased pain with extension and SB Goal status: IN PROGRESS  4.  Patient will demonstrate improved functional strength and balance as demonstrated by DGI >19/24 to decrease risk of falls.  Baseline: 18/24 on 08/26/21 Goal status: IN PROGRESS  5.  Patient will report 43% on lumbar FOTO to demonstrate improved functional ability.  Baseline: 33% Goal status: IN PROGRESS   6.  Patient will tolerate 15 min of  walking to access community.  Baseline: increased pain after 5-10 min Goal status: IN PROGRESS  7.  Patient will be able to tolerate sitting >1 hour without increased LLE pain or swelling in order to travel.  Baseline: 30 min Goal status: IN PROGRESS    PLAN: PT FREQUENCY: 1-2x/week  PT DURATION: 12 weeks  PLANNED  INTERVENTIONS: Therapeutic exercises, Therapeutic activity, Neuromuscular re-education, Balance training, Gait training, Patient/Family education, Self Care, Joint mobilization, Stair training, Orthotic/Fit training, DME instructions, Aquatic Therapy, Dry Needling, Electrical stimulation, Spinal mobilization, Cryotherapy, Moist heat, Manual therapy, and Re-evaluation.  PLAN FOR NEXT SESSION: 6MWT, core strengthening for back, check leg length discrepancy   Rennie Natter, PT, DPT  11/08/2021, 6:43 PM

## 2021-11-15 ENCOUNTER — Ambulatory Visit: Payer: No Typology Code available for payment source

## 2021-11-15 DIAGNOSIS — M5442 Lumbago with sciatica, left side: Secondary | ICD-10-CM | POA: Diagnosis not present

## 2021-11-15 DIAGNOSIS — G8929 Other chronic pain: Secondary | ICD-10-CM

## 2021-11-15 DIAGNOSIS — R262 Difficulty in walking, not elsewhere classified: Secondary | ICD-10-CM

## 2021-11-15 DIAGNOSIS — M6281 Muscle weakness (generalized): Secondary | ICD-10-CM

## 2021-11-15 DIAGNOSIS — R2689 Other abnormalities of gait and mobility: Secondary | ICD-10-CM

## 2021-11-15 DIAGNOSIS — R2681 Unsteadiness on feet: Secondary | ICD-10-CM

## 2021-11-15 NOTE — Therapy (Signed)
OUTPATIENT PHYSICAL THERAPY TREATMENT   Patient Name: Andrew Banks MRN: [id number] DOB:07-18-1950, 71 y.o., male Today's Date: 11/15/2021   PT End of Session - 11/15/21 1400     Visit Number 4    Number of Visits 15    Date for PT Re-Evaluation 01/03/22    Authorization Type [facility], [employer]    Authorization Time Period 10/11/21-01/03/2022    Authorization - Visit Number 4    Authorization - Number of Visits 15    Progress Note Due on Visit 10    PT Start Time 1316    PT Stop Time 1358    PT Time Calculation (min) 42 min    Activity Tolerance Patient tolerated treatment well    Behavior During Therapy WFL for tasks assessed/performed              Past Medical History:  Diagnosis Date   Arthritis    Degenerative disc disease, lumbar    Hypertension    Liver transplanted [facility])    Skin cancer    Past Surgical History:  Procedure Laterality Date   liver transplant     There are no problems to display for this patient.   PCP: Felipa Eth, MD  REFERRING PROVIDER: [facility]  REFERRING DIAG: M54.50 (ICD-10-CM) - Low back pain, unspecified  Rationale for Evaluation and Treatment Rehabilitation  THERAPY DIAG:  Chronic left-sided low back pain with left-sided sciatica  Muscle weakness (generalized)  Unsteadiness on feet  Difficulty in walking, not elsewhere classified  Chronic pain of left knee  Other abnormalities of gait and mobility  ONSET DATE: chronic  SUBJECTIVE:                                                                                                                                                                                           SUBJECTIVE STATEMENT: His back has still been bothering him, the L hip no so much.  PERTINENT HISTORY:  History skin cancer, liver transplant, s/p ORIF Pelvic fx 11/12/19, s/p IMN L distal femur 08/12/2019, s/p L IM nailing femur  11/02/20, L foot drop, lumbar DDD,   PAIN:  Are you having pain? Yes: NPRS scale:  6/10 Pain location: L side low back, hip, pelvis, 6-7/10 L knee, L foot 5/10  Pain description: constant ache low back/hip; sharp pain stabbing in lower leg,  Aggravating factors: standing/walking long periods >45 min  Relieving factors: sitting down   PRECAUTIONS: Fall  WEIGHT BEARING RESTRICTIONS No  FALLS:  Has patient fallen in last 6 months? No  LIVING ENVIRONMENT: Lives with: lives with their spouse Lives in: House/apartment Stairs: Yes: External: 17 steps; on  right going up, on left going up, and can reach both Has following equipment at home: Single point cane, Walker - 4 wheeled, Wheelchair (manual), Electronics engineer, and Grab bars  OCCUPATION: retired  PLOF: Independent with household mobility with device  PATIENT GOALS lesson pain, improve gait, sit to be able to travel longer distances   OBJECTIVE:   DIAGNOSTIC FINDINGS:  XR 12/22/20: 1.  Healing unstable traumatic U-shaped sacral fracture (spinopelvic dissociation) status post internal fixation with bilateral iliosacral screws at S1 and A left transsacral transiliac screw at S2. Both hips are located. No hardware complication. No gross change in alignment.   2.  Healed left ischiopubic ramus fracture.   3.  Healing peri-implant pertrochanteric left femoral fracture status post cephalomedullary nail fixation. No hardware is intact without loosening. No change in fracture alignment.   4.  Healed bicondylar distal femoral fracture (extending proximally to the distal third diaphysis) status post ORIF with lateral buttress plate and screws. No hardware complication or change in fracture alignment.   5.  Medial patellar fracture is is not visible on this examination and seen on the CT left knee dated 11/11/2019.   6.  Healed nondisplaced fibular neck fracture.   7.  No new fractures.   8.  No joint misalignment (both sacroiliac, pubic symphysis, and both hips, and left knee).   9.  Status post embolization of multiple arteries (left  L3 and L4 lumbar, right L4 lumbar, 2 branches of the left deep circumflex iliac artery, and bilateral iliolumbar arteries (per Interventional Radiology procedure note dated 11/10/2020) with multiple embolization coils in and around the anatomic pelvis. Vascular plug in the right groin.   10.  Status post ACL reconstruction. Femoral interference screw and partially imaged tibial screw are intact without loosening.   11.  Osteopenia.   12.  Moderate degenerative changes of the knee.   13.  Mild degenerative changes of the hips and pubic symphysis.   14.  Degenerative changes of the partially imaged lumbosacral spine.   15.  Partially imaged mesh tacks in the abdomen (along the ventral abdominal wall when correlating with prior CT abdomen/pelvis dated 11/16/2019).     PATIENT SURVEYS:  FOTO 33%, predicted outcome 43%  COGNITION:  Overall cognitive status: Within functional limits for tasks assessed     SENSATION: Not tested  MUSCLE LENGTH: Hamstrings: Right 90 deg; Left 90 deg  POSTURE: weight shift left  PALPATION: Tenderness L lower back, hypomobility lumbar spine.   LUMBAR ROM:   Active  AROM  eval  Flexion To feet, increased pain with return  Extension Limited 75%, increased pain  Right lateral flexion To knee, Increased pain L side  Left lateral flexion To knee, Increased pain L side  Right rotation No pain  Left rotation No pain    (Blank rows = not tested)  LOWER EXTREMITY ROM:     Active  Right eval Left eval  Hip flexion    Hip extension    Hip abduction    Hip adduction    Hip internal rotation 20 20  Hip external rotation 80 50  Knee flexion 125 100  Knee extension 0 Lacking 14   Ankle dorsiflexion    Ankle plantarflexion    Ankle inversion    Ankle eversion     (Blank rows = not tested)  LOWER EXTREMITY MMT:    MMT Right * eval Left * eval  Hip flexion 5 5  Hip extension    Hip abduction 5  5  Hip adduction 5 5  Knee flexion 5 4  Knee extension 5 5   Ankle dorsiflexion 5 3  Ankle plantarflexion 5 3  Ankle inversion 5 3  Ankle eversion 5 2   (Blank rows = not tested) *reported increased pain with all resisted movements bil.   LUMBAR SPECIAL TESTS:  Straight leg raise test: Negative  FUNCTIONAL TESTS:  5 times sit to stand: 10.4 seconds  GAIT: Distance walked: 50' Assistive device utilized: Environmental consultant - 4 wheeled Level of assistance: Modified independence Comments: no longer wears AFO, L ankle tends to intoe  TODAY'S TREATMENT  11/15/21 TherEx: Nustep L5x67mn Standing on airex 2x30 sec Standing on airex head turns x 20 - more unsteady turning to R Standing trunk rotations on airex x 10 bil Seated ankle pumps L x 20   6 min walk test: 510 ft; rest required after 400 ft (1:35 left)  11/08/2021 Therapeutic Exercise: to improve strength and mobility.  Demo, verbal and tactile cues throughout for technique. L6 x 8 min UE and LE Ankle BAPS L1 Left ankle -DF/PF x 20, Inv/Ever x 20, CW x 10, CCW x 10  Ankle sways  Ankle sways on Airex - very challenging Supine bridges x 15 Manual Therapy: to decrease muscle spasm and pain and improve mobility  STM to L lumbar paraspinals, QL stretch in Sidelying STM/TPR to L peroneals, skilled palpation and monitoring during dry needling. Trigger Point Dry-Needling  Treatment instructions: Expect mild to moderate muscle soreness. S/S of pneumothorax if dry needled over a lung field, and to seek immediate medical attention should they occur. Patient verbalized understanding of these instructions and education.  Patient Consent Given: Yes Education handout provided: Previously provided Muscles treated: L peroneals Electrical stimulation performed: No Parameters: N/A Treatment response/outcome: Twitch Response Elicited and Palpable Increase in Muscle Length    10/25/2021 Therapeutic Exercise: to improve strength and mobility.  Demo, verbal and tactile cues throughout for technique. Nustep L5 x  8 min UE and LE Supine pelvic tilts x 10 Supine bridges x 10 Supine marching x 10 Supine dead bug x 10 - cues to not flex head, keep relaxed.   Manual Therapy: to decrease muscle spasm and pain and improve mobility  STM to L vastus, skilled palpation and monitoring during dry needling.  STM/TPR to L lumbar paraspinals, QL, piriformis and glutes in S/L Trigger Point Dry-Needling  Treatment instructions: Expect mild to moderate muscle soreness.  Patient verbalized understanding of these instructions and education.  Patient Consent Given: Yes Education handout provided: Yes Muscles treated: L vastus lateralis Treatment response/outcome: Twitch Response Elicited and Palpable Increase in Muscle Length   PATIENT EDUCATION:  Education details: dry needling, HEP update Person educated: Patient Education method: Explanation, Demonstration, Verbal cues, and Handouts Education comprehension: verbalized understanding and returned demonstration   HOME EXERCISE PROGRAM: Access Code: [email or web address] URL: [email or web address] Date: 10/25/2021 Prepared by: EGlenetta Hew Exercises Added  - Supine Posterior Pelvic Tilt  - 1 x daily - 7 x weekly - 2 sets - 10 reps - Supine March with Posterior Pelvic Tilt  - 1 x daily - 7 x weekly - 2 sets - 10 reps - Dead Bug  - 1 x daily - 7 x weekly - 2 sets - 10 reps - Supine Bridge  - 1 x daily - 7 x weekly - 2 sets - 10 reps  ASSESSMENT:  CLINICAL IMPRESSION:   Mr. JDassresponded well to treatment. He went 510 ft with 6 min  walk test - required seated rest after 400 ft of walking. He was unsteady with the head and trunk rotations on airex. He reported the trunk rotations "woke that rod up in L hip" but subsided with sitting. He would continue to benefit from skilled PT to address remaining deficits.  OBJECTIVE IMPAIRMENTS Abnormal gait, decreased activity tolerance, decreased balance, decreased endurance, decreased mobility, difficulty  walking, decreased ROM, decreased strength, hypomobility, increased fascial restrictions, increased muscle spasms, impaired flexibility, postural dysfunction, and pain.   ACTIVITY LIMITATIONS carrying, lifting, bending, sitting, standing, sleeping, stairs, transfers, dressing, and locomotion level  PARTICIPATION LIMITATIONS: meal prep, cleaning, laundry, driving, shopping, community activity, yard work, and travel  [place] Age, Past/current experiences, Time since onset of injury/illness/exacerbation, and 3+ comorbidities: COPD, chronic LBP, liver transplant, skin cancer, HTN, osteoporosis, history falls with injury, and surgical hardware  are also affecting patient's functional outcome.   REHAB POTENTIAL: Good  CLINICAL DECISION MAKING: Evolving/moderate complexity  EVALUATION COMPLEXITY: Moderate   GOALS: Goals reviewed with patient? Yes  SHORT TERM GOALS: Target date: 11/01/2021   Patient will be independent with initial HEP.  Baseline:  HEP given 10/25/21 Goal status: IN PROGRESS   LONG TERM GOALS: Target date: 01/03/2022    Patient will be independent with advanced/ongoing HEP to improve outcomes and carryover.  Baseline:  Goal status: IN PROGRESS  2.  Patient will report 50% improvement in low back pain to improve QOL.  Baseline:  Goal status: IN PROGRESS  3.  Patient will demonstrate full pain free lumbar ROM to perform ADLs.   Baseline: increased pain with extension and SB Goal status: IN PROGRESS  4.  Patient will demonstrate improved functional strength and balance as demonstrated by DGI >19/24 to decrease risk of falls.  Baseline: 18/24 on 08/26/21 Goal status: IN PROGRESS  5.  Patient will report 43% on lumbar FOTO to demonstrate improved functional ability.  Baseline: 33% Goal status: IN PROGRESS   6.  Patient will tolerate 15 min of  walking to access community.  Baseline: increased pain after 5-10 min Goal status: IN PROGRESS  7.  Patient will be  able to tolerate sitting >1 hour without increased LLE pain or swelling in order to travel.  Baseline: 30 min Goal status: IN PROGRESS    PLAN: PT FREQUENCY: 1-2x/week  PT DURATION: 12 weeks  PLANNED INTERVENTIONS: Therapeutic exercises, Therapeutic activity, Neuromuscular re-education, Balance training, Gait training, Patient/Family education, Self Care, Joint mobilization, Stair training, Orthotic/Fit training, DME instructions, Aquatic Therapy, Dry Needling, Electrical stimulation, Spinal mobilization, Cryotherapy, Moist heat, Manual therapy, and Re-evaluation.  PLAN FOR NEXT SESSION: core strengthening for back, check leg length discrepancy   Artist Pais, PTA, DPT  11/15/2021, 2:01 PM

## 2021-11-22 ENCOUNTER — Ambulatory Visit: Payer: No Typology Code available for payment source | Admitting: Physical Therapy

## 2021-11-29 ENCOUNTER — Ambulatory Visit: Payer: No Typology Code available for payment source

## 2021-12-02 ENCOUNTER — Encounter: Payer: No Typology Code available for payment source | Admitting: Physical Therapy

## 2021-12-06 ENCOUNTER — Ambulatory Visit: Payer: No Typology Code available for payment source | Attending: Emergency Medicine

## 2021-12-06 DIAGNOSIS — R262 Difficulty in walking, not elsewhere classified: Secondary | ICD-10-CM | POA: Insufficient documentation

## 2021-12-06 DIAGNOSIS — R2681 Unsteadiness on feet: Secondary | ICD-10-CM | POA: Insufficient documentation

## 2021-12-06 DIAGNOSIS — M5442 Lumbago with sciatica, left side: Secondary | ICD-10-CM | POA: Diagnosis present

## 2021-12-06 DIAGNOSIS — M25562 Pain in left knee: Secondary | ICD-10-CM | POA: Insufficient documentation

## 2021-12-06 DIAGNOSIS — M6281 Muscle weakness (generalized): Secondary | ICD-10-CM | POA: Diagnosis present

## 2021-12-06 DIAGNOSIS — R2689 Other abnormalities of gait and mobility: Secondary | ICD-10-CM | POA: Diagnosis present

## 2021-12-06 DIAGNOSIS — G8929 Other chronic pain: Secondary | ICD-10-CM | POA: Insufficient documentation

## 2021-12-06 NOTE — Therapy (Signed)
OUTPATIENT PHYSICAL THERAPY TREATMENT   Patient Name: Ahmere Hemenway MRN: [id number] DOB:[birth year], 71 y.o., male Today's Date: 12/06/2021   PT End of Session - 12/06/21 1403     Visit Number 5    Number of Visits 15    Date for PT Re-Evaluation 01/03/22    Authorization Type [facility], [employer]    Authorization Time Period 10/11/21-01/03/2022    Authorization - Visit Number 5    Authorization - Number of Visits 15    Progress Note Due on Visit 10    PT Start Time 1316    PT Stop Time 1400    PT Time Calculation (min) 44 min    Activity Tolerance Patient tolerated treatment well    Behavior During Therapy WFL for tasks assessed/performed               Past Medical History:  Diagnosis Date   Arthritis    Degenerative disc disease, lumbar    Hypertension    Liver transplanted [facility])    Skin cancer    Past Surgical History:  Procedure Laterality Date   liver transplant     There are no problems to display for this patient.   PCP: Felipa Eth, MD  REFERRING PROVIDER: [facility]  REFERRING DIAG: M54.50 (ICD-10-CM) - Low back pain, unspecified  Rationale for Evaluation and Treatment Rehabilitation  THERAPY DIAG:  Chronic left-sided low back pain with left-sided sciatica  Muscle weakness (generalized)  Unsteadiness on feet  Difficulty in walking, not elsewhere classified  Chronic pain of left knee  Other abnormalities of gait and mobility  ONSET DATE: chronic  SUBJECTIVE:                                                                                                                                                                                           SUBJECTIVE STATEMENT: Pt reports he was out from getting COVID shot and flu shot together, he is feeling better but his back has been bad  PERTINENT HISTORY:  History skin cancer, liver transplant, s/p ORIF Pelvic fx 11/12/19, s/p IMN L distal femur 08/12/2019, s/p L IM nailing femur  11/02/20, L foot drop, lumbar  DDD,   PAIN:  Are you having pain? Yes: NPRS scale: 6/10 Pain location: L side low back, hip, pelvis, 6-7/10 L knee, L foot 5/10  Pain description: constant ache low back/hip; sharp pain stabbing in lower leg,  Aggravating factors: standing/walking long periods >45 min  Relieving factors: sitting down   PRECAUTIONS: Fall  WEIGHT BEARING RESTRICTIONS No  FALLS:  Has patient fallen in last 6 months? No  LIVING ENVIRONMENT: Lives with: lives with  their spouse Lives in: House/apartment Stairs: Yes: External: 17 steps; on right going up, on left going up, and can reach both Has following equipment at home: Single point cane, Walker - 4 wheeled, Wheelchair (manual), Electronics engineer, and Grab bars  OCCUPATION: retired  PLOF: Independent with household mobility with device  PATIENT GOALS lesson pain, improve gait, sit to be able to travel longer distances   OBJECTIVE:   DIAGNOSTIC FINDINGS:  XR 12/22/20: 1.  Healing unstable traumatic U-shaped sacral fracture (spinopelvic dissociation) status post internal fixation with bilateral iliosacral screws at S1 and A left transsacral transiliac screw at S2. Both hips are located. No hardware complication. No gross change in alignment.   2.  Healed left ischiopubic ramus fracture.   3.  Healing peri-implant pertrochanteric left femoral fracture status post cephalomedullary nail fixation. No hardware is intact without loosening. No change in fracture alignment.   4.  Healed bicondylar distal femoral fracture (extending proximally to the distal third diaphysis) status post ORIF with lateral buttress plate and screws. No hardware complication or change in fracture alignment.   5.  Medial patellar fracture is is not visible on this examination and seen on the CT left knee dated 11/11/2019.   6.  Healed nondisplaced fibular neck fracture.   7.  No new fractures.   8.  No joint misalignment (both sacroiliac, pubic symphysis, and both hips, and left knee).    9.  Status post embolization of multiple arteries (left L3 and L4 lumbar, right L4 lumbar, 2 branches of the left deep circumflex iliac artery, and bilateral iliolumbar arteries (per Interventional Radiology procedure note dated 11/10/2020) with multiple embolization coils in and around the anatomic pelvis. Vascular plug in the right groin.   10.  Status post ACL reconstruction. Femoral interference screw and partially imaged tibial screw are intact without loosening.   11.  Osteopenia.   12.  Moderate degenerative changes of the knee.   13.  Mild degenerative changes of the hips and pubic symphysis.   14.  Degenerative changes of the partially imaged lumbosacral spine.   15.  Partially imaged mesh tacks in the abdomen (along the ventral abdominal wall when correlating with prior CT abdomen/pelvis dated 11/16/2019).     PATIENT SURVEYS:  FOTO 33%, predicted outcome 43%  COGNITION:  Overall cognitive status: Within functional limits for tasks assessed     SENSATION: Not tested  MUSCLE LENGTH: Hamstrings: Right 90 deg; Left 90 deg  POSTURE: weight shift left  PALPATION: Tenderness L lower back, hypomobility lumbar spine.   LUMBAR ROM:   Active  AROM  eval  Flexion To feet, increased pain with return  Extension Limited 75%, increased pain  Right lateral flexion To knee, Increased pain L side  Left lateral flexion To knee, Increased pain L side  Right rotation No pain  Left rotation No pain    (Blank rows = not tested)  LOWER EXTREMITY ROM:     Active  Right eval Left eval  Hip flexion    Hip extension    Hip abduction    Hip adduction    Hip internal rotation 20 20  Hip external rotation 80 50  Knee flexion 125 100  Knee extension 0 Lacking 14   Ankle dorsiflexion    Ankle plantarflexion    Ankle inversion    Ankle eversion     (Blank rows = not tested)  LOWER EXTREMITY MMT:    MMT Right * eval Left * eval  Hip flexion  5 5  Hip extension    Hip abduction 5 5   Hip adduction 5 5  Knee flexion 5 4  Knee extension 5 5  Ankle dorsiflexion 5 3  Ankle plantarflexion 5 3  Ankle inversion 5 3  Ankle eversion 5 2   (Blank rows = not tested) *reported increased pain with all resisted movements bil.   LUMBAR SPECIAL TESTS:  Straight leg raise test: Negative  FUNCTIONAL TESTS:  5 times sit to stand: 10.4 seconds  GAIT: Distance walked: 50' Assistive device utilized: Environmental consultant - 4 wheeled Level of assistance: Modified independence Comments: no longer wears AFO, L ankle tends to intoe  TODAY'S TREATMENT  12/06/21 Therapeutic Exercise: to improve strength and mobility.  Demo, verbal and tactile cues throughout for technique. Nustep L5 x 8 min  Gait 3x 90 ft with rollator for warm up Seated lumbar flexion stretch green yoga ball x 10 - LBP afterward Seated marching 2# 2x10 Seated LAQ 2# 2x10  Manual Therapy: to decrease muscle spasm and pain and improve mobility  STM to L lumbar paraspinals, QL 11/15/21 TherEx: Nustep L5x53mn Standing on airex 2x30 sec Standing on airex head turns x 20 - more unsteady turning to R Standing trunk rotations on airex x 10 bil Seated ankle pumps L x 20   6 min walk test: 510 ft; rest required after 400 ft (1:35 left)  11/08/2021 Therapeutic Exercise: to improve strength and mobility.  Demo, verbal and tactile cues throughout for technique. L6 x 8 min UE and LE Ankle BAPS L1 Left ankle -DF/PF x 20, Inv/Ever x 20, CW x 10, CCW x 10  Ankle sways  Ankle sways on Airex - very challenging Supine bridges x 15 Manual Therapy: to decrease muscle spasm and pain and improve mobility  STM to L lumbar paraspinals, QL stretch in Sidelying STM/TPR to L peroneals, skilled palpation and monitoring during dry needling. Trigger Point Dry-Needling  Treatment instructions: Expect mild to moderate muscle soreness. S/S of pneumothorax if dry needled over a lung field, and to seek immediate medical attention should they occur.  Patient verbalized understanding of these instructions and education.  Patient Consent Given: Yes Education handout provided: Previously provided Muscles treated: L peroneals Electrical stimulation performed: No Parameters: N/A Treatment response/outcome: Twitch Response Elicited and Palpable Increase in Muscle Length    10/25/2021 Therapeutic Exercise: to improve strength and mobility.  Demo, verbal and tactile cues throughout for technique. Nustep L5 x 8 min UE and LE Supine pelvic tilts x 10 Supine bridges x 10 Supine marching x 10 Supine dead bug x 10 - cues to not flex head, keep relaxed.   Manual Therapy: to decrease muscle spasm and pain and improve mobility  STM to L vastus, skilled palpation and monitoring during dry needling.  STM/TPR to L lumbar paraspinals, QL, piriformis and glutes in S/L Trigger Point Dry-Needling  Treatment instructions: Expect mild to moderate muscle soreness.  Patient verbalized understanding of these instructions and education.  Patient Consent Given: Yes Education handout provided: Yes Muscles treated: L vastus lateralis Treatment response/outcome: Twitch Response Elicited and Palpable Increase in Muscle Length   PATIENT EDUCATION:  Education details: dry needling, HEP update Person educated: Patient Education method: Explanation, Demonstration, Verbal cues, and Handouts Education comprehension: verbalized understanding and returned demonstration   HOME EXERCISE PROGRAM: Access Code: [email or web address] URL: [email or web address] Date: 10/25/2021 Prepared by: EGlenetta Hew Exercises Added  - Supine Posterior Pelvic Tilt  - 1 x daily - 7 x weekly - 2  sets - 10 reps - Supine March with Posterior Pelvic Tilt  - 1 x daily - 7 x weekly - 2 sets - 10 reps - Dead Bug  - 1 x daily - 7 x weekly - 2 sets - 10 reps - Supine Bridge  - 1 x daily - 7 x weekly - 2 sets - 10 reps  ASSESSMENT:  CLINICAL IMPRESSION:   Pt had increased pain  in low back today so we worked more on MT to decrease his pain. Most tension and pain was noticed along the L QL and lumbar paraspinals, and after manual he noted decreased pain. He noted that his max walking time w/o being limited by pain was 15 min. Will hopefully be able to progress balance and WB hip strengthening to improve stability.  OBJECTIVE IMPAIRMENTS Abnormal gait, decreased activity tolerance, decreased balance, decreased endurance, decreased mobility, difficulty walking, decreased ROM, decreased strength, hypomobility, increased fascial restrictions, increased muscle spasms, impaired flexibility, postural dysfunction, and pain.   ACTIVITY LIMITATIONS carrying, lifting, bending, sitting, standing, sleeping, stairs, transfers, dressing, and locomotion level  PARTICIPATION LIMITATIONS: meal prep, cleaning, laundry, driving, shopping, community activity, yard work, and travel  [place] Age, Past/current experiences, Time since onset of injury/illness/exacerbation, and 3+ comorbidities: COPD, chronic LBP, liver transplant, skin cancer, HTN, osteoporosis, history falls with injury, and surgical hardware  are also affecting patient's functional outcome.   REHAB POTENTIAL: Good  CLINICAL DECISION MAKING: Evolving/moderate complexity  EVALUATION COMPLEXITY: Moderate   GOALS: Goals reviewed with patient? Yes  SHORT TERM GOALS: Target date: 11/01/2021   Patient will be independent with initial HEP.  Baseline:  HEP given 10/25/21 Goal status: IN PROGRESS   LONG TERM GOALS: Target date: 01/03/2022    Patient will be independent with advanced/ongoing HEP to improve outcomes and carryover.  Baseline:  Goal status: IN PROGRESS  2.  Patient will report 50% improvement in low back pain to improve QOL.  Baseline:  Goal status: IN PROGRESS  3.  Patient will demonstrate full pain free lumbar ROM to perform ADLs.   Baseline: increased pain with extension and SB Goal status: IN  PROGRESS  4.  Patient will demonstrate improved functional strength and balance as demonstrated by DGI >19/24 to decrease risk of falls.  Baseline: 18/24 on 08/26/21 Goal status: IN PROGRESS  5.  Patient will report 43% on lumbar FOTO to demonstrate improved functional ability.  Baseline: 33% Goal status: IN PROGRESS   6.  Patient will tolerate 15 min of  walking to access community.  Baseline: increased pain after 5-10 min Goal status: IN PROGRESS- 15 min per pt  7.  Patient will be able to tolerate sitting >1 hour without increased LLE pain or swelling in order to travel.  Baseline: 30 min Goal status: IN PROGRESS    PLAN: PT FREQUENCY: 1-2x/week  PT DURATION: 12 weeks  PLANNED INTERVENTIONS: Therapeutic exercises, Therapeutic activity, Neuromuscular re-education, Balance training, Gait training, Patient/Family education, Self Care, Joint mobilization, Stair training, Orthotic/Fit training, DME instructions, Aquatic Therapy, Dry Needling, Electrical stimulation, Spinal mobilization, Cryotherapy, Moist heat, Manual therapy, and Re-evaluation.  PLAN FOR NEXT SESSION: core strengthening for back, check leg length discrepancy   Artist Pais, PTA 12/06/2021, 2:03 PM

## 2021-12-15 ENCOUNTER — Ambulatory Visit: Payer: No Typology Code available for payment source

## 2021-12-20 ENCOUNTER — Ambulatory Visit: Payer: No Typology Code available for payment source | Admitting: Physical Therapy

## 2021-12-20 DIAGNOSIS — M6281 Muscle weakness (generalized): Secondary | ICD-10-CM

## 2021-12-20 DIAGNOSIS — R2689 Other abnormalities of gait and mobility: Secondary | ICD-10-CM

## 2021-12-20 DIAGNOSIS — G8929 Other chronic pain: Secondary | ICD-10-CM

## 2021-12-20 DIAGNOSIS — M5442 Lumbago with sciatica, left side: Secondary | ICD-10-CM | POA: Diagnosis not present

## 2021-12-20 DIAGNOSIS — R2681 Unsteadiness on feet: Secondary | ICD-10-CM

## 2021-12-20 DIAGNOSIS — R262 Difficulty in walking, not elsewhere classified: Secondary | ICD-10-CM

## 2021-12-20 NOTE — Therapy (Signed)
OUTPATIENT PHYSICAL THERAPY TREATMENT   Patient Name: Andrew Banks MRN: [id number] DOB:Jun 07, 1950, 71 y.o., male Today's Date: 12/20/2021   PT End of Session - 12/20/21 1340     Visit Number 6    Number of Visits 15    Date for PT Re-Evaluation 01/03/22    Authorization Type [facility], [employer]    Authorization Time Period 10/11/21-01/03/2022    Authorization - Number of Visits 15    Progress Note Due on Visit 10    PT Start Time 7824    PT Stop Time 1400    PT Time Calculation (min) 43 min    Activity Tolerance Patient tolerated treatment well    Behavior During Therapy WFL for tasks assessed/performed               Past Medical History:  Diagnosis Date   Arthritis    Degenerative disc disease, lumbar    Hypertension    Liver transplanted [facility])    Skin cancer    Past Surgical History:  Procedure Laterality Date   liver transplant     There are no problems to display for this patient.   PCP: Felipa Eth, MD  REFERRING PROVIDER: [facility]  REFERRING DIAG: M54.50 (ICD-10-CM) - Low back pain, unspecified  Rationale for Evaluation and Treatment Rehabilitation  THERAPY DIAG:  Chronic left-sided low back pain with left-sided sciatica  Muscle weakness (generalized)  Unsteadiness on feet  Difficulty in walking, not elsewhere classified  Chronic pain of left knee  Other abnormalities of gait and mobility  ONSET DATE: chronic  SUBJECTIVE:                                                                                                                                                                                           SUBJECTIVE STATEMENT: Andrew Banks reports that he is feeling better, continues to work on exercises at home.  Reports no new fells but has had a couple of "close calls."  PERTINENT HISTORY:  History skin cancer, liver transplant, s/p ORIF Pelvic fx 11/12/19, s/p IMN L distal femur 08/12/2019, s/p L IM nailing femur  11/02/20, L foot drop,  lumbar DDD,   PAIN:  Are you having pain? Yes: NPRS scale: 5/10 Pain location: L side low back, hip, pelvis, 6-7/10 L knee, L foot 5/10  Pain description: constant ache low back/hip; sharp pain stabbing in lower leg,  Aggravating factors: standing/walking long periods >45 min  Relieving factors: sitting down   PRECAUTIONS: Fall  WEIGHT BEARING RESTRICTIONS No  FALLS:  Has patient fallen in last 6 months? No  LIVING ENVIRONMENT: Lives with: lives with their spouse Lives  in: House/apartment Stairs: Yes: External: 17 steps; on right going up, on left going up, and can reach both Has following equipment at home: Single point cane, Walker - 4 wheeled, Wheelchair (manual), Electronics engineer, and Grab bars  OCCUPATION: retired  PLOF: Independent with household mobility with device  PATIENT GOALS lesson pain, improve gait, sit to be able to travel longer distances   OBJECTIVE:   DIAGNOSTIC FINDINGS:  XR 12/22/20: 1.  Healing unstable traumatic U-shaped sacral fracture (spinopelvic dissociation) status post internal fixation with bilateral iliosacral screws at S1 and A left transsacral transiliac screw at S2. Both hips are located. No hardware complication. No gross change in alignment.   2.  Healed left ischiopubic ramus fracture.   3.  Healing peri-implant pertrochanteric left femoral fracture status post cephalomedullary nail fixation. No hardware is intact without loosening. No change in fracture alignment.   4.  Healed bicondylar distal femoral fracture (extending proximally to the distal third diaphysis) status post ORIF with lateral buttress plate and screws. No hardware complication or change in fracture alignment.   5.  Medial patellar fracture is is not visible on this examination and seen on the CT left knee dated 11/11/2019.   6.  Healed nondisplaced fibular neck fracture.   7.  No new fractures.   8.  No joint misalignment (both sacroiliac, pubic symphysis, and both hips, and left  knee).   9.  Status post embolization of multiple arteries (left L3 and L4 lumbar, right L4 lumbar, 2 branches of the left deep circumflex iliac artery, and bilateral iliolumbar arteries (per Interventional Radiology procedure note dated 11/10/2020) with multiple embolization coils in and around the anatomic pelvis. Vascular plug in the right groin.   10.  Status post ACL reconstruction. Femoral interference screw and partially imaged tibial screw are intact without loosening.   11.  Osteopenia.   12.  Moderate degenerative changes of the knee.   13.  Mild degenerative changes of the hips and pubic symphysis.   14.  Degenerative changes of the partially imaged lumbosacral spine.   15.  Partially imaged mesh tacks in the abdomen (along the ventral abdominal wall when correlating with prior CT abdomen/pelvis dated 11/16/2019).     PATIENT SURVEYS:  FOTO 33%, predicted outcome 43%  COGNITION:  Overall cognitive status: Within functional limits for tasks assessed     SENSATION: Not tested  MUSCLE LENGTH: Hamstrings: Right 90 deg; Left 90 deg  POSTURE: weight shift left  PALPATION: Tenderness L lower back, hypomobility lumbar spine.   LUMBAR ROM:   Active  AROM  eval  Flexion To feet, increased pain with return  Extension Limited 75%, increased pain  Right lateral flexion To knee, Increased pain L side  Left lateral flexion To knee, Increased pain L side  Right rotation No pain  Left rotation No pain    (Blank rows = not tested)  LOWER EXTREMITY ROM:     Active  Right eval Left eval  Hip flexion    Hip extension    Hip abduction    Hip adduction    Hip internal rotation 20 20  Hip external rotation 80 50  Knee flexion 125 100  Knee extension 0 Lacking 14   Ankle dorsiflexion    Ankle plantarflexion    Ankle inversion    Ankle eversion     (Blank rows = not tested)  LOWER EXTREMITY MMT:    MMT Right * eval Left * eval  Hip flexion 5 5  Hip extension    Hip  abduction 5 5  Hip adduction 5 5  Knee flexion 5 4  Knee extension 5 5  Ankle dorsiflexion 5 3  Ankle plantarflexion 5 3  Ankle inversion 5 3  Ankle eversion 5 2   (Blank rows = not tested) *reported increased pain with all resisted movements bil.   LUMBAR SPECIAL TESTS:  Straight leg raise test: Negative  FUNCTIONAL TESTS:  5 times sit to stand: 10.4 seconds  GAIT: Distance walked: 50' Assistive device utilized: Environmental consultant - 4 wheeled Level of assistance: Modified independence Comments: no longer wears AFO, L ankle tends to intoe  TODAY'S TREATMENT  12/20/2021 Therapeutic Exercise: to improve strength and mobility.  Demo, verbal and tactile cues throughout for technique. Gait x 600' Ankle exercises - DF/PF, Inv/Ever, CW and CCW circles Isometric squat with hip openers RTB 3 x 10 Squat with theraband RTB 5 x 5  Neuromuscular Reeducation: to improve balance and stability. SBA/CGA for safety throughout.  On Aerex: ankle sways, reaching outside BOS all directions including crossing midline, perturbations all directions.   12/06/21 Therapeutic Exercise: to improve strength and mobility.  Demo, verbal and tactile cues throughout for technique. Nustep L5 x 8 min  Gait 3x 90 ft with rollator for warm up Seated lumbar flexion stretch green yoga ball x 10 - LBP afterward Seated marching 2# 2x10 Seated LAQ 2# 2x10  Manual Therapy: to decrease muscle spasm and pain and improve mobility  STM to L lumbar paraspinals, QL 11/15/21 TherEx: Nustep L5x18mn Standing on airex 2x30 sec Standing on airex head turns x 20 - more unsteady turning to R Standing trunk rotations on airex x 10 bil Seated ankle pumps L x 20   6 min walk test: 510 ft; rest required after 400 ft (1:35 left)  11/08/2021 Therapeutic Exercise: to improve strength and mobility.  Demo, verbal and tactile cues throughout for technique. L6 x 8 min UE and LE Ankle BAPS L1 Left ankle -DF/PF x 20, Inv/Ever x 20, CW x 10, CCW x  10  Ankle sways  Ankle sways on Airex - very challenging Supine bridges x 15 Manual Therapy: to decrease muscle spasm and pain and improve mobility  STM to L lumbar paraspinals, QL stretch in Sidelying STM/TPR to L peroneals, skilled palpation and monitoring during dry needling. Trigger Point Dry-Needling  Treatment instructions: Expect mild to moderate muscle soreness. S/S of pneumothorax if dry needled over a lung field, and to seek immediate medical attention should they occur. Patient verbalized understanding of these instructions and education.  Patient Consent Given: Yes Education handout provided: Previously provided Muscles treated: L peroneals Electrical stimulation performed: No Parameters: N/A Treatment response/outcome: Twitch Response Elicited and Palpable Increase in Muscle Length   PATIENT EDUCATION:  Education details: HEP update 12/20/21 Person educated: Patient Education method: Explanation, Demonstration, Verbal cues, and Handouts Education comprehension: verbalized understanding and returned demonstration   HOME EXERCISE PROGRAM: Access Code: [email or web address] URL: [email or web address] Date: 12/20/2021 Prepared by: EGlenetta Hew Exercises - Long Sitting Calf Stretch with Strap  - 1 x daily - 7 x weekly - 3 sets - 10 reps - Seated Toe Raise  - 1 x daily - 7 x weekly - 3 sets - 10 reps - Ankle Inversion Eversion Towel Slide  - 1 x daily - 7 x weekly - 3 sets - 10 reps - Seated Ankle Eversion with Resistance  - 1 x daily - 7 x weekly - 3 sets - 10 reps -  Seated Ankle Inversion Eversion PROM  - 1 x daily - 7 x weekly - 3 sets - 10 reps - Seated Ankle Alphabet  - 1 x daily - 7 x weekly - 3 sets - 10 reps - Supine Posterior Pelvic Tilt  - 1 x daily - 7 x weekly - 2 sets - 10 reps - Supine March with Posterior Pelvic Tilt  - 1 x daily - 7 x weekly - 2 sets - 10 reps - Dead Bug  - 1 x daily - 7 x weekly - 2 sets - 10 reps - Supine Bridge  - 1 x daily -  7 x weekly - 2 sets - 10 reps - Squat with Chair Touch and Resistance Loop  - 1 x daily - 7 x weekly - 3 sets - 10 reps - Ankle Circles on Wobble Board in Sitting  - 1 x daily - 7 x weekly - 3 sets - 10 reps  ASSESSMENT:  CLINICAL IMPRESSION: Focus of today's skilled intervention was continuing to improve LLE strength and balance.  He demonstrates improving strength and L ankle ROM with wobble board, recommended inexpensive option for home use.  Also progressed hip strengthening exercises.  With balance exercises he demonstrated improved ankle and hip strategy today, no LOB with balance exercises but very anxious.  Andrew Banks continues to demonstrate potential for improvement and would benefit from continued skilled therapy to address impairments.     OBJECTIVE IMPAIRMENTS Abnormal gait, decreased activity tolerance, decreased balance, decreased endurance, decreased mobility, difficulty walking, decreased ROM, decreased strength, hypomobility, increased fascial restrictions, increased muscle spasms, impaired flexibility, postural dysfunction, and pain.   ACTIVITY LIMITATIONS carrying, lifting, bending, sitting, standing, sleeping, stairs, transfers, dressing, and locomotion level  PARTICIPATION LIMITATIONS: meal prep, cleaning, laundry, driving, shopping, community activity, yard work, and travel  [place] Age, Past/current experiences, Time since onset of injury/illness/exacerbation, and 3+ comorbidities: COPD, chronic LBP, liver transplant, skin cancer, HTN, osteoporosis, history falls with injury, and surgical hardware  are also affecting patient's functional outcome.   REHAB POTENTIAL: Good  CLINICAL DECISION MAKING: Evolving/moderate complexity  EVALUATION COMPLEXITY: Moderate   GOALS: Goals reviewed with patient? Yes  SHORT TERM GOALS: Target date: 11/01/2021   Patient will be independent with initial HEP.  Baseline:  HEP given 10/25/21 Goal status: IN  PROGRESS   LONG TERM GOALS: Target date: 01/03/2022    Patient will be independent with advanced/ongoing HEP to improve outcomes and carryover.  Baseline:  Goal status: IN PROGRESS  2.  Patient will report 50% improvement in low back pain to improve QOL.  Baseline:  Goal status: IN PROGRESS  3.  Patient will demonstrate full pain free lumbar ROM to perform ADLs.   Baseline: increased pain with extension and SB Goal status: IN PROGRESS  4.  Patient will demonstrate improved functional strength and balance as demonstrated by DGI >19/24 to decrease risk of falls.  Baseline: 18/24 on 08/26/21 Goal status: IN PROGRESS  5.  Patient will report 43% on lumbar FOTO to demonstrate improved functional ability.  Baseline: 33% Goal status: IN PROGRESS   6.  Patient will tolerate 15 min of  walking to access community.  Baseline: increased pain after 5-10 min Goal status: IN PROGRESS- 15 min per pt  7.  Patient will be able to tolerate sitting >1 hour without increased LLE pain or swelling in order to travel.  Baseline: 30 min Goal status: IN PROGRESS    PLAN: PT FREQUENCY: 1-2x/week  PT DURATION:  12 weeks  PLANNED INTERVENTIONS: Therapeutic exercises, Therapeutic activity, Neuromuscular re-education, Balance training, Gait training, Patient/Family education, Self Care, Joint mobilization, Stair training, Orthotic/Fit training, DME instructions, Aquatic Therapy, Dry Needling, Electrical stimulation, Spinal mobilization, Cryotherapy, Moist heat, Manual therapy, and Re-evaluation.  PLAN FOR NEXT SESSION: core strengthening for back, check leg length discrepancy   Rennie Natter, PT 12/20/2021, 4:28 PM

## 2021-12-27 ENCOUNTER — Ambulatory Visit: Payer: No Typology Code available for payment source | Admitting: Physical Therapy

## 2021-12-27 ENCOUNTER — Encounter: Payer: Self-pay | Admitting: Physical Therapy

## 2021-12-27 DIAGNOSIS — R2681 Unsteadiness on feet: Secondary | ICD-10-CM

## 2021-12-27 DIAGNOSIS — R262 Difficulty in walking, not elsewhere classified: Secondary | ICD-10-CM

## 2021-12-27 DIAGNOSIS — M6281 Muscle weakness (generalized): Secondary | ICD-10-CM

## 2021-12-27 DIAGNOSIS — G8929 Other chronic pain: Secondary | ICD-10-CM

## 2021-12-27 DIAGNOSIS — M5442 Lumbago with sciatica, left side: Secondary | ICD-10-CM | POA: Diagnosis not present

## 2021-12-27 NOTE — Therapy (Signed)
OUTPATIENT PHYSICAL THERAPY TREATMENT   Patient Name: Andrew Banks MRN: [id number] DOB:1951-01-04, 71 y.o., male Today's Date: 12/27/2021   PT End of Session - 12/27/21 1314     Visit Number 7    Number of Visits 15    Date for PT Re-Evaluation 01/03/22    Authorization Type [facility], [employer]    Authorization Time Period 10/11/21-01/03/2022    Authorization - Visit Number 7    Authorization - Number of Visits 15    Progress Note Due on Visit 10    PT Start Time 1315    PT Stop Time 1400    PT Time Calculation (min) 45 min    Activity Tolerance Patient tolerated treatment well    Behavior During Therapy WFL for tasks assessed/performed               Past Medical History:  Diagnosis Date   Arthritis    Degenerative disc disease, lumbar    Hypertension    Liver transplanted [facility])    Skin cancer    Past Surgical History:  Procedure Laterality Date   liver transplant     There are no problems to display for this patient.   PCP: Felipa Eth, MD  REFERRING PROVIDER: [facility]  REFERRING DIAG: M54.50 (ICD-10-CM) - Low back pain, unspecified  Rationale for Evaluation and Treatment Rehabilitation  THERAPY DIAG:  Chronic left-sided low back pain with left-sided sciatica  Muscle weakness (generalized)  Unsteadiness on feet  Difficulty in walking, not elsewhere classified  Chronic pain of left knee  ONSET DATE: chronic  SUBJECTIVE:                                                                                                                                                                                           SUBJECTIVE STATEMENT: Andrew Banks had a good thanksgiving, but he was on his feet a lot when his family was visiting, so hurting more "but it was worth it!."    PERTINENT HISTORY:  History skin cancer, liver transplant, s/p ORIF Pelvic fx 11/12/19, s/p IMN L distal femur 08/12/2019, s/p L IM nailing femur  11/02/20, L foot drop, lumbar DDD,   PAIN:   Are you having pain? Yes: NPRS scale: 7/10 Pain location: L side low back, hip, pelvis, 6-7/10 L knee, L foot 5/10  Pain description: constant ache low back/hip; sharp pain stabbing in lower leg,  Aggravating factors: standing/walking long periods >45 min  Relieving factors: sitting down   PRECAUTIONS: Fall  WEIGHT BEARING RESTRICTIONS No  FALLS:  Has patient fallen in last 6 months? No  LIVING ENVIRONMENT: Lives with: lives with their  spouse Lives in: House/apartment Stairs: Yes: External: 17 steps; on right going up, on left going up, and can reach both Has following equipment at home: Single point cane, Walker - 4 wheeled, Wheelchair (manual), Electronics engineer, and Grab bars  OCCUPATION: retired  PLOF: Independent with household mobility with device  PATIENT GOALS lesson pain, improve gait, sit to be able to travel longer distances   OBJECTIVE:   DIAGNOSTIC FINDINGS:  XR 12/22/20: 1.  Healing unstable traumatic U-shaped sacral fracture (spinopelvic dissociation) status post internal fixation with bilateral iliosacral screws at S1 and A left transsacral transiliac screw at S2. Both hips are located. No hardware complication. No gross change in alignment.   2.  Healed left ischiopubic ramus fracture.   3.  Healing peri-implant pertrochanteric left femoral fracture status post cephalomedullary nail fixation. No hardware is intact without loosening. No change in fracture alignment.   4.  Healed bicondylar distal femoral fracture (extending proximally to the distal third diaphysis) status post ORIF with lateral buttress plate and screws. No hardware complication or change in fracture alignment.   5.  Medial patellar fracture is is not visible on this examination and seen on the CT left knee dated 11/11/2019.   6.  Healed nondisplaced fibular neck fracture.   7.  No new fractures.   8.  No joint misalignment (both sacroiliac, pubic symphysis, and both hips, and left knee).   9.  Status post  embolization of multiple arteries (left L3 and L4 lumbar, right L4 lumbar, 2 branches of the left deep circumflex iliac artery, and bilateral iliolumbar arteries (per Interventional Radiology procedure note dated 11/10/2020) with multiple embolization coils in and around the anatomic pelvis. Vascular plug in the right groin.   10.  Status post ACL reconstruction. Femoral interference screw and partially imaged tibial screw are intact without loosening.   11.  Osteopenia.   12.  Moderate degenerative changes of the knee.   13.  Mild degenerative changes of the hips and pubic symphysis.   14.  Degenerative changes of the partially imaged lumbosacral spine.   15.  Partially imaged mesh tacks in the abdomen (along the ventral abdominal wall when correlating with prior CT abdomen/pelvis dated 11/16/2019).     PATIENT SURVEYS:  FOTO 33%, predicted outcome 43%  COGNITION:  Overall cognitive status: Within functional limits for tasks assessed     SENSATION: Not tested  MUSCLE LENGTH: Hamstrings: Right 90 deg; Left 90 deg  POSTURE: weight shift left  PALPATION: Tenderness L lower back, hypomobility lumbar spine.   LUMBAR ROM:   Active  AROM  eval  Flexion To feet, increased pain with return  Extension Limited 75%, increased pain  Right lateral flexion To knee, Increased pain L side  Left lateral flexion To knee, Increased pain L side  Right rotation No pain  Left rotation No pain    (Blank rows = not tested)  LOWER EXTREMITY ROM:     Active  Right eval Left eval  Hip flexion    Hip extension    Hip abduction    Hip adduction    Hip internal rotation 20 20  Hip external rotation 80 50  Knee flexion 125 100  Knee extension 0 Lacking 14   Ankle dorsiflexion    Ankle plantarflexion    Ankle inversion    Ankle eversion     (Blank rows = not tested)  LOWER EXTREMITY MMT:    MMT Right * eval Left * eval  Hip flexion 5  5  Hip extension    Hip abduction 5 5  Hip adduction 5 5   Knee flexion 5 4  Knee extension 5 5  Ankle dorsiflexion 5 3  Ankle plantarflexion 5 3  Ankle inversion 5 3  Ankle eversion 5 2   (Blank rows = not tested) *reported increased pain with all resisted movements bil.   LUMBAR SPECIAL TESTS:  Straight leg raise test: Negative  FUNCTIONAL TESTS:  5 times sit to stand: 10.4 seconds  GAIT: Distance walked: 50' Assistive device utilized: Environmental consultant - 4 wheeled Level of assistance: Modified independence Comments: no longer wears AFO, L ankle tends to intoe  TODAY'S TREATMENT  12/27/2021 Therapeutic Exercise: to improve strength and mobility.  Demo, verbal and tactile cues throughout for technique. Nustep L6 x 6 min  Bridges  x 15, x 15 with feet on airex LTR x 10 SLR x 10 bil  Seated Low back flexion stretch with ball x 10 Ankle DF/PF x 10, Inv/ever x 10 (minA) BAPS board level 2  Neuromuscular Reeducation: to improve balance and stability. SBA/CGA for safety throughout.  On Aerex: ankle sways, perturbations all directions, chops with yellow weighted ball x 10 bil Manual Therapy: to decrease muscle spasm and pain and improve mobility Gentle stretches to L foot, mobs to 1st ray, STM/TPR to plantar fascia.    12/20/2021 Therapeutic Exercise: to improve strength and mobility.  Demo, verbal and tactile cues throughout for technique. Gait x 600' Ankle exercises - DF/PF, Inv/Ever, CW and CCW circles Isometric squat with hip openers RTB 3 x 10 Squat with theraband RTB 5 x 5  Neuromuscular Reeducation: to improve balance and stability. SBA/CGA for safety throughout.  On Aerex: ankle sways, reaching outside BOS all directions including crossing midline, perturbations all directions.   12/06/21 Therapeutic Exercise: to improve strength and mobility.  Demo, verbal and tactile cues throughout for technique. Nustep L5 x 8 min  Gait 3x 90 ft with rollator for warm up Seated lumbar flexion stretch green yoga ball x 10 - LBP afterward Seated  marching 2# 2x10 Seated LAQ 2# 2x10  Manual Therapy: to decrease muscle spasm and pain and improve mobility  STM to L lumbar paraspinals, QL 11/15/21 TherEx: Nustep L5x80mn Standing on airex 2x30 sec Standing on airex head turns x 20 - more unsteady turning to R Standing trunk rotations on airex x 10 bil Seated ankle pumps L x 20   6 min walk test: 510 ft; rest required after 400 ft (1:35 left)   PATIENT EDUCATION:  Education details: HEP update 12/20/21 Person educated: Patient Education method: Explanation, Demonstration, Verbal cues, and Handouts Education comprehension: verbalized understanding and returned demonstration   HOME EXERCISE PROGRAM: Access Code: [email or web address] URL: [email or web address] Date: 12/20/2021 Prepared by: EGlenetta Hew Exercises - Long Sitting Calf Stretch with Strap  - 1 x daily - 7 x weekly - 3 sets - 10 reps - Seated Toe Raise  - 1 x daily - 7 x weekly - 3 sets - 10 reps - Ankle Inversion Eversion Towel Slide  - 1 x daily - 7 x weekly - 3 sets - 10 reps - Seated Ankle Eversion with Resistance  - 1 x daily - 7 x weekly - 3 sets - 10 reps - Seated Ankle Inversion Eversion PROM  - 1 x daily - 7 x weekly - 3 sets - 10 reps - Seated Ankle Alphabet  - 1 x daily - 7 x weekly - 3 sets - 10 reps -  Supine Posterior Pelvic Tilt  - 1 x daily - 7 x weekly - 2 sets - 10 reps - Supine March with Posterior Pelvic Tilt  - 1 x daily - 7 x weekly - 2 sets - 10 reps - Dead Bug  - 1 x daily - 7 x weekly - 2 sets - 10 reps - Supine Bridge  - 1 x daily - 7 x weekly - 2 sets - 10 reps - Squat with Chair Touch and Resistance Loop  - 1 x daily - 7 x weekly - 3 sets - 10 reps - Ankle Circles on Wobble Board in Sitting  - 1 x daily - 7 x weekly - 3 sets - 10 reps  ASSESSMENT:  CLINICAL IMPRESSION: Abby Stines reports he felt significant improvement in gait following last session.  Reviewed exercises for LE/hip strengthening for LBP, followed by  balance training.  Noted increased tightness in L foot today and more inversion, reported relief with manual therapy.  Alcario Drought continues to demonstrate potential for improvement and would benefit from continued skilled therapy to address impairments.     OBJECTIVE IMPAIRMENTS Abnormal gait, decreased activity tolerance, decreased balance, decreased endurance, decreased mobility, difficulty walking, decreased ROM, decreased strength, hypomobility, increased fascial restrictions, increased muscle spasms, impaired flexibility, postural dysfunction, and pain.   ACTIVITY LIMITATIONS carrying, lifting, bending, sitting, standing, sleeping, stairs, transfers, dressing, and locomotion level  PARTICIPATION LIMITATIONS: meal prep, cleaning, laundry, driving, shopping, community activity, yard work, and travel  [place] Age, Past/current experiences, Time since onset of injury/illness/exacerbation, and 3+ comorbidities: COPD, chronic LBP, liver transplant, skin cancer, HTN, osteoporosis, history falls with injury, and surgical hardware  are also affecting patient's functional outcome.   REHAB POTENTIAL: Good  CLINICAL DECISION MAKING: Evolving/moderate complexity  EVALUATION COMPLEXITY: Moderate   GOALS: Goals reviewed with patient? Yes  SHORT TERM GOALS: Target date: 11/01/2021   Patient will be independent with initial HEP.  Baseline:  HEP given 10/25/21 Goal status: MET  good compliance   LONG TERM GOALS: Target date: 01/03/2022    Patient will be independent with advanced/ongoing HEP to improve outcomes and carryover.  Baseline:  Goal status: IN PROGRESS  2.  Patient will report 50% improvement in low back pain to improve QOL.  Baseline:  Goal status: IN PROGRESS  3.  Patient will demonstrate full pain free lumbar ROM to perform ADLs.   Baseline: increased pain with extension and SB Goal status: IN PROGRESS  4.  Patient will demonstrate improved functional strength and  balance as demonstrated by DGI >19/24 to decrease risk of falls.  Baseline: 18/24 on 08/26/21 Goal status: IN PROGRESS  5.  Patient will report 43% on lumbar FOTO to demonstrate improved functional ability.  Baseline: 33% Goal status: IN PROGRESS   6.  Patient will tolerate 15 min of  walking to access community.  Baseline: increased pain after 5-10 min Goal status: IN PROGRESS- 15 min per pt  7.  Patient will be able to tolerate sitting >1 hour without increased LLE pain or swelling in order to travel.  Baseline: 30 min Goal status: IN PROGRESS    PLAN: PT FREQUENCY: 1-2x/week  PT DURATION: 12 weeks  PLANNED INTERVENTIONS: Therapeutic exercises, Therapeutic activity, Neuromuscular re-education, Balance training, Gait training, Patient/Family education, Self Care, Joint mobilization, Stair training, Orthotic/Fit training, DME instructions, Aquatic Therapy, Dry Needling, Electrical stimulation, Spinal mobilization, Cryotherapy, Moist heat, Manual therapy, and Re-evaluation.  PLAN FOR NEXT SESSION: recheck goals, continue LE/hip/core strengthening, balance training.  Rennie Natter, PT, DPT  12/27/2021, 2:42 PM

## 2022-01-04 ENCOUNTER — Ambulatory Visit: Payer: No Typology Code available for payment source | Attending: Emergency Medicine

## 2022-01-04 DIAGNOSIS — R2681 Unsteadiness on feet: Secondary | ICD-10-CM | POA: Insufficient documentation

## 2022-01-04 DIAGNOSIS — R262 Difficulty in walking, not elsewhere classified: Secondary | ICD-10-CM | POA: Insufficient documentation

## 2022-01-04 DIAGNOSIS — M5442 Lumbago with sciatica, left side: Secondary | ICD-10-CM | POA: Insufficient documentation

## 2022-01-04 DIAGNOSIS — G8929 Other chronic pain: Secondary | ICD-10-CM | POA: Insufficient documentation

## 2022-01-04 DIAGNOSIS — M6281 Muscle weakness (generalized): Secondary | ICD-10-CM | POA: Insufficient documentation

## 2022-01-12 ENCOUNTER — Ambulatory Visit: Payer: No Typology Code available for payment source

## 2022-01-12 DIAGNOSIS — R262 Difficulty in walking, not elsewhere classified: Secondary | ICD-10-CM | POA: Diagnosis present

## 2022-01-12 DIAGNOSIS — R2681 Unsteadiness on feet: Secondary | ICD-10-CM

## 2022-01-12 DIAGNOSIS — G8929 Other chronic pain: Secondary | ICD-10-CM

## 2022-01-12 DIAGNOSIS — M5442 Lumbago with sciatica, left side: Secondary | ICD-10-CM | POA: Diagnosis not present

## 2022-01-12 DIAGNOSIS — M6281 Muscle weakness (generalized): Secondary | ICD-10-CM | POA: Diagnosis present

## 2022-01-12 NOTE — Therapy (Signed)
OUTPATIENT PHYSICAL THERAPY TREATMENT   Patient Name: Andrew Banks MRN: [id number] DOB:1950-04-15, 71 y.o., male Today's Date: 01/12/2022   PT End of Session - 01/12/22 1454     Visit Number 8    Number of Visits 15    Date for PT Re-Evaluation 01/03/22    Authorization Type [facility], [employer]    Authorization Time Period 10/11/21-01/03/2022    Authorization - Visit Number 8    Authorization - Number of Visits 15    Progress Note Due on Visit 10    PT Start Time 1403    PT Stop Time 1448    PT Time Calculation (min) 45 min    Activity Tolerance Patient tolerated treatment well    Behavior During Therapy WFL for tasks assessed/performed                Past Medical History:  Diagnosis Date   Arthritis    Degenerative disc disease, lumbar    Hypertension    Liver transplanted [facility])    Skin cancer    Past Surgical History:  Procedure Laterality Date   liver transplant     There are no problems to display for this patient.   PCP: Felipa Eth, MD  REFERRING PROVIDER: [facility]  REFERRING DIAG: M54.50 (ICD-10-CM) - Low back pain, unspecified  Rationale for Evaluation and Treatment Rehabilitation  THERAPY DIAG:  Chronic left-sided low back pain with left-sided sciatica  Muscle weakness (generalized)  Unsteadiness on feet  Difficulty in walking, not elsewhere classified  ONSET DATE: chronic  SUBJECTIVE:                                                                                                                                                                                           SUBJECTIVE STATEMENT: Pt reports   PERTINENT HISTORY:  History skin cancer, liver transplant, s/p ORIF Pelvic fx 11/12/19, s/p IMN L distal femur 08/12/2019, s/p L IM nailing femur  11/02/20, L foot drop, lumbar DDD,   PAIN:  Are you having pain? Yes: NPRS scale: 6-7/10 Pain location: L side low back, hip, pelvis, 6-7/10 L knee, L foot 5/10  Pain description: constant ache low  back/hip; sharp pain stabbing in lower leg,  Aggravating factors: standing/walking long periods >45 min  Relieving factors: sitting down   PRECAUTIONS: Fall  WEIGHT BEARING RESTRICTIONS No  FALLS:  Has patient fallen in last 6 months? No  LIVING ENVIRONMENT: Lives with: lives with their spouse Lives in: House/apartment Stairs: Yes: External: 17 steps; on right going up, on left going up, and can reach both Has following equipment at home: Single point cane, Walker -  4 wheeled, Wheelchair (manual), Electronics engineer, and Grab bars  OCCUPATION: retired  PLOF: Independent with household mobility with device  PATIENT GOALS lesson pain, improve gait, sit to be able to travel longer distances   OBJECTIVE:   DIAGNOSTIC FINDINGS:  XR 12/22/20: 1.  Healing unstable traumatic U-shaped sacral fracture (spinopelvic dissociation) status post internal fixation with bilateral iliosacral screws at S1 and A left transsacral transiliac screw at S2. Both hips are located. No hardware complication. No gross change in alignment.   2.  Healed left ischiopubic ramus fracture.   3.  Healing peri-implant pertrochanteric left femoral fracture status post cephalomedullary nail fixation. No hardware is intact without loosening. No change in fracture alignment.   4.  Healed bicondylar distal femoral fracture (extending proximally to the distal third diaphysis) status post ORIF with lateral buttress plate and screws. No hardware complication or change in fracture alignment.   5.  Medial patellar fracture is is not visible on this examination and seen on the CT left knee dated 11/11/2019.   6.  Healed nondisplaced fibular neck fracture.   7.  No new fractures.   8.  No joint misalignment (both sacroiliac, pubic symphysis, and both hips, and left knee).   9.  Status post embolization of multiple arteries (left L3 and L4 lumbar, right L4 lumbar, 2 branches of the left deep circumflex iliac artery, and bilateral iliolumbar  arteries (per Interventional Radiology procedure note dated 11/10/2020) with multiple embolization coils in and around the anatomic pelvis. Vascular plug in the right groin.   10.  Status post ACL reconstruction. Femoral interference screw and partially imaged tibial screw are intact without loosening.   11.  Osteopenia.   12.  Moderate degenerative changes of the knee.   13.  Mild degenerative changes of the hips and pubic symphysis.   14.  Degenerative changes of the partially imaged lumbosacral spine.   15.  Partially imaged mesh tacks in the abdomen (along the ventral abdominal wall when correlating with prior CT abdomen/pelvis dated 11/16/2019).     PATIENT SURVEYS:  FOTO 33%, predicted outcome 43%  COGNITION:  Overall cognitive status: Within functional limits for tasks assessed     SENSATION: Not tested  MUSCLE LENGTH: Hamstrings: Right 90 deg; Left 90 deg  POSTURE: weight shift left  PALPATION: Tenderness L lower back, hypomobility lumbar spine.   LUMBAR ROM:   Active  AROM  eval AROM 01/12/22  Flexion To feet, increased pain with return To feet, inc pain coming up  Extension Limited 75%, increased pain Limited 50%  Right lateral flexion To knee, Increased pain L side To knee - increased pain  Left lateral flexion To knee, Increased pain L side To knee  Right rotation No pain 25% limit  Left rotation No pain  25% limit   (Blank rows = not tested)  LOWER EXTREMITY ROM:     Active  Right eval Left eval  Hip flexion    Hip extension    Hip abduction    Hip adduction    Hip internal rotation 20 20  Hip external rotation 80 50  Knee flexion 125 100  Knee extension 0 Lacking 14   Ankle dorsiflexion    Ankle plantarflexion    Ankle inversion    Ankle eversion     (Blank rows = not tested)  LOWER EXTREMITY MMT:    MMT Right * eval Left * eval  Hip flexion 5 5  Hip extension    Hip abduction 5  5  Hip adduction 5 5  Knee flexion 5 4  Knee extension 5 5   Ankle dorsiflexion 5 3  Ankle plantarflexion 5 3  Ankle inversion 5 3  Ankle eversion 5 2   (Blank rows = not tested) *reported increased pain with all resisted movements bil.   LUMBAR SPECIAL TESTS:  Straight leg raise test: Negative  FUNCTIONAL TESTS:  5 times sit to stand: 10.4 seconds  GAIT: Distance walked: 50' Assistive device utilized: Environmental consultant - 4 wheeled Level of assistance: Modified independence Comments: no longer wears AFO, L ankle tends to intoe  TODAY'S TREATMENT   01/12/22 Therapeutic Exercise: to improve strength and mobility.  Demo, verbal and tactile cues throughout for technique. Nustep L6 x 6 min  Lumbar AROM Seated pallof press on dynadisc 2x10 Standing hip flexor stretch 3x15 BLE on step SLS R/L with opp arm shoulder press x 5 bil Star excursion 3x 1/2 circle each side with SBQC Gait- 90 ft with [facility]  12/27/2021 Therapeutic Exercise: to improve strength and mobility.  Demo, verbal and tactile cues throughout for technique. Nustep L6 x 6 min  Bridges  x 15, x 15 with feet on airex LTR x 10 SLR x 10 bil  Seated Low back flexion stretch with ball x 10 Ankle DF/PF x 10, Inv/ever x 10 (minA) BAPS board level 2  Neuromuscular Reeducation: to improve balance and stability. SBA/CGA for safety throughout.  On Aerex: ankle sways, perturbations all directions, chops with yellow weighted ball x 10 bil Manual Therapy: to decrease muscle spasm and pain and improve mobility Gentle stretches to L foot, mobs to 1st ray, STM/TPR to plantar fascia.    12/20/2021 Therapeutic Exercise: to improve strength and mobility.  Demo, verbal and tactile cues throughout for technique. Gait x 600' Ankle exercises - DF/PF, Inv/Ever, CW and CCW circles Isometric squat with hip openers RTB 3 x 10 Squat with theraband RTB 5 x 5  Neuromuscular Reeducation: to improve balance and stability. SBA/CGA for safety throughout.  On Aerex: ankle sways, reaching outside BOS all directions  including crossing midline, perturbations all directions.   12/06/21 Therapeutic Exercise: to improve strength and mobility.  Demo, verbal and tactile cues throughout for technique. Nustep L5 x 8 min  Gait 3x 90 ft with rollator for warm up Seated lumbar flexion stretch green yoga ball x 10 - LBP afterward Seated marching 2# 2x10 Seated LAQ 2# 2x10  Manual Therapy: to decrease muscle spasm and pain and improve mobility  STM to L lumbar paraspinals, QL 11/15/21 TherEx: Nustep L5x59mn Standing on airex 2x30 sec Standing on airex head turns x 20 - more unsteady turning to R Standing trunk rotations on airex x 10 bil Seated ankle pumps L x 20   6 min walk test: 510 ft; rest required after 400 ft (1:35 left)   PATIENT EDUCATION:  Education details: HEP update 12/20/21 Person educated: Patient Education method: Explanation, Demonstration, Verbal cues, and Handouts Education comprehension: verbalized understanding and returned demonstration   HOME EXERCISE PROGRAM: Access Code: [email or web address] URL: [email or web address] Date: 12/20/2021 Prepared by: EGlenetta Hew Exercises - Long Sitting Calf Stretch with Strap  - 1 x daily - 7 x weekly - 3 sets - 10 reps - Seated Toe Raise  - 1 x daily - 7 x weekly - 3 sets - 10 reps - Ankle Inversion Eversion Towel Slide  - 1 x daily - 7 x weekly - 3 sets - 10 reps - Seated Ankle Eversion with Resistance  -  1 x daily - 7 x weekly - 3 sets - 10 reps - Seated Ankle Inversion Eversion PROM  - 1 x daily - 7 x weekly - 3 sets - 10 reps - Seated Ankle Alphabet  - 1 x daily - 7 x weekly - 3 sets - 10 reps - Supine Posterior Pelvic Tilt  - 1 x daily - 7 x weekly - 2 sets - 10 reps - Supine March with Posterior Pelvic Tilt  - 1 x daily - 7 x weekly - 2 sets - 10 reps - Dead Bug  - 1 x daily - 7 x weekly - 2 sets - 10 reps - Supine Bridge  - 1 x daily - 7 x weekly - 2 sets - 10 reps - Squat with Chair Touch and Resistance Loop  - 1 x daily  - 7 x weekly - 3 sets - 10 reps - Ankle Circles on Wobble Board in Sitting  - 1 x daily - 7 x weekly - 3 sets - 10 reps  ASSESSMENT:  CLINICAL IMPRESSION: Focus of skilled interventions today was core strength, proximal flexibility, and balance. Min A was required with the star excursion. He showed most difficulty with posterior and posterior-lateral reach. Lumbar AROM shows some improvement with extension. He would continue to benefit from skilled PT to address deficits.   OBJECTIVE IMPAIRMENTS Abnormal gait, decreased activity tolerance, decreased balance, decreased endurance, decreased mobility, difficulty walking, decreased ROM, decreased strength, hypomobility, increased fascial restrictions, increased muscle spasms, impaired flexibility, postural dysfunction, and pain.   ACTIVITY LIMITATIONS carrying, lifting, bending, sitting, standing, sleeping, stairs, transfers, dressing, and locomotion level  PARTICIPATION LIMITATIONS: meal prep, cleaning, laundry, driving, shopping, community activity, yard work, and travel  [place] Age, Past/current experiences, Time since onset of injury/illness/exacerbation, and 3+ comorbidities: COPD, chronic LBP, liver transplant, skin cancer, HTN, osteoporosis, history falls with injury, and surgical hardware  are also affecting patient's functional outcome.   REHAB POTENTIAL: Good  CLINICAL DECISION MAKING: Evolving/moderate complexity  EVALUATION COMPLEXITY: Moderate   GOALS: Goals reviewed with patient? Yes  SHORT TERM GOALS: Target date: 11/01/2021   Patient will be independent with initial HEP.  Baseline:  HEP given 10/25/21 Goal status: MET  good compliance   LONG TERM GOALS: Target date: 01/03/2022    Patient will be independent with advanced/ongoing HEP to improve outcomes and carryover.  Baseline:  Goal status: IN PROGRESS  2.  Patient will report 50% improvement in low back pain to improve QOL.  Baseline:  Goal status: IN  PROGRESS  3.  Patient will demonstrate full pain free lumbar ROM to perform ADLs.   Baseline: increased pain with extension and SB Goal status: IN PROGRESS  4.  Patient will demonstrate improved functional strength and balance as demonstrated by DGI >19/24 to decrease risk of falls.  Baseline: 18/24 on 08/26/21 Goal status: IN PROGRESS  5.  Patient will report 43% on lumbar FOTO to demonstrate improved functional ability.  Baseline: 33% Goal status: IN PROGRESS   6.  Patient will tolerate 15 min of  walking to access community.  Baseline: increased pain after 5-10 min Goal status: IN PROGRESS- 15 min per pt  7.  Patient will be able to tolerate sitting >1 hour without increased LLE pain or swelling in order to travel.  Baseline: 30 min Goal status: IN PROGRESS    PLAN: PT FREQUENCY: 1-2x/week  PT DURATION: 12 weeks  PLANNED INTERVENTIONS: Therapeutic exercises, Therapeutic activity, Neuromuscular re-education, Balance training, Gait training,  Patient/Family education, Self Care, Joint mobilization, Stair training, Orthotic/Fit training, DME instructions, Aquatic Therapy, Dry Needling, Electrical stimulation, Spinal mobilization, Cryotherapy, Moist heat, Manual therapy, and Re-evaluation.  PLAN FOR NEXT SESSION: recheck goals, continue LE/hip/core strengthening, balance training.    Artist Pais, PTA 01/12/2022, 2:54 PM

## 2022-01-18 ENCOUNTER — Ambulatory Visit: Payer: No Typology Code available for payment source

## 2022-01-18 DIAGNOSIS — R2681 Unsteadiness on feet: Secondary | ICD-10-CM

## 2022-01-18 DIAGNOSIS — R262 Difficulty in walking, not elsewhere classified: Secondary | ICD-10-CM

## 2022-01-18 DIAGNOSIS — M6281 Muscle weakness (generalized): Secondary | ICD-10-CM

## 2022-01-18 DIAGNOSIS — M5442 Lumbago with sciatica, left side: Secondary | ICD-10-CM | POA: Diagnosis not present

## 2022-01-18 DIAGNOSIS — G8929 Other chronic pain: Secondary | ICD-10-CM

## 2022-01-18 NOTE — Therapy (Addendum)
OUTPATIENT PHYSICAL THERAPY TREATMENT Progress Note Reporting Period 10/11/21 to 01/03/22  See note below for Objective Data and Assessment of Progress/Goals.    I have reviewed Alcario Drought progress and based on objective data, he would continue to benefit from skilled PT for an additional 8 weeks for a frequency of 1-2x per week to address remaining balance deficits, decreased ROM, and decreased walking tolerance to return to PLOF.  Rennie Natter, PT, DPT   Patient Name: Andrew Banks MRN: [id number] DOB:31-May-1950, 71 y.o., male Today's Date: 01/18/2022   PT End of Session - 01/18/22 1450     Visit Number 9    Number of Visits 15    Date for PT Re-Evaluation 03/15/22    Authorization Type [facility], [employer]    Authorization - Number of Visits 15    Progress Note Due on Visit 10    PT Start Time 1400    PT Stop Time 1448    PT Time Calculation (min) 48 min    Activity Tolerance Patient tolerated treatment well    Behavior During Therapy WFL for tasks assessed/performed                 Past Medical History:  Diagnosis Date   Arthritis    Degenerative disc disease, lumbar    Hypertension    Liver transplanted [facility])    Skin cancer    Past Surgical History:  Procedure Laterality Date   liver transplant     There are no problems to display for this patient.   PCP: Felipa Eth, MD  REFERRING PROVIDER: [facility]  REFERRING DIAG: M54.50 (ICD-10-CM) - Low back pain, unspecified  Rationale for Evaluation and Treatment Rehabilitation  THERAPY DIAG:  Chronic left-sided low back pain with left-sided sciatica  Muscle weakness (generalized)  Unsteadiness on feet  Difficulty in walking, not elsewhere classified  ONSET DATE: chronic  SUBJECTIVE:                                                                                                                                                                                           SUBJECTIVE STATEMENT: Pt  reports pain today, his biggest issue is his L ankle/foot, as he has trouble flattening his toes and everting his foot.  PERTINENT HISTORY:  History skin cancer, liver transplant, s/p ORIF Pelvic fx 11/12/19, s/p IMN L distal femur 08/12/2019, s/p L IM nailing femur  11/02/20, L foot drop, lumbar DDD,   PAIN:  Are you having pain? Yes: NPRS scale: 5/10 Pain location: L side low back, hip, pelvis, 6/10 L knee, L foot 6/10  Pain description: constant ache low back/hip;  sharp pain stabbing in lower leg,  Aggravating factors: standing/walking long periods >45 min  Relieving factors: sitting down   PRECAUTIONS: Fall  WEIGHT BEARING RESTRICTIONS No  FALLS:  Has patient fallen in last 6 months? No  LIVING ENVIRONMENT: Lives with: lives with their spouse Lives in: House/apartment Stairs: Yes: External: 17 steps; on right going up, on left going up, and can reach both Has following equipment at home: Single point cane, Walker - 4 wheeled, Wheelchair (manual), Electronics engineer, and Grab bars  OCCUPATION: retired  PLOF: Independent with household mobility with device  PATIENT GOALS lesson pain, improve gait, sit to be able to travel longer distances   OBJECTIVE:   DIAGNOSTIC FINDINGS:  XR 12/22/20: 1.  Healing unstable traumatic U-shaped sacral fracture (spinopelvic dissociation) status post internal fixation with bilateral iliosacral screws at S1 and A left transsacral transiliac screw at S2. Both hips are located. No hardware complication. No gross change in alignment.   2.  Healed left ischiopubic ramus fracture.   3.  Healing peri-implant pertrochanteric left femoral fracture status post cephalomedullary nail fixation. No hardware is intact without loosening. No change in fracture alignment.   4.  Healed bicondylar distal femoral fracture (extending proximally to the distal third diaphysis) status post ORIF with lateral buttress plate and screws. No hardware complication or change in fracture  alignment.   5.  Medial patellar fracture is is not visible on this examination and seen on the CT left knee dated 11/11/2019.   6.  Healed nondisplaced fibular neck fracture.   7.  No new fractures.   8.  No joint misalignment (both sacroiliac, pubic symphysis, and both hips, and left knee).   9.  Status post embolization of multiple arteries (left L3 and L4 lumbar, right L4 lumbar, 2 branches of the left deep circumflex iliac artery, and bilateral iliolumbar arteries (per Interventional Radiology procedure note dated 11/10/2020) with multiple embolization coils in and around the anatomic pelvis. Vascular plug in the right groin.   10.  Status post ACL reconstruction. Femoral interference screw and partially imaged tibial screw are intact without loosening.   11.  Osteopenia.   12.  Moderate degenerative changes of the knee.   13.  Mild degenerative changes of the hips and pubic symphysis.   14.  Degenerative changes of the partially imaged lumbosacral spine.   15.  Partially imaged mesh tacks in the abdomen (along the ventral abdominal wall when correlating with prior CT abdomen/pelvis dated 11/16/2019).     PATIENT SURVEYS:  FOTO 33%, predicted outcome 43%  COGNITION:  Overall cognitive status: Within functional limits for tasks assessed     SENSATION: Not tested  MUSCLE LENGTH: Hamstrings: Right 90 deg; Left 90 deg  POSTURE: weight shift left  PALPATION: Tenderness L lower back, hypomobility lumbar spine.   LUMBAR ROM:   Active  AROM  eval AROM 01/12/22  Flexion To feet, increased pain with return To feet, inc pain coming up  Extension Limited 75%, increased pain Limited 50%  Right lateral flexion To knee, Increased pain L side To knee - increased pain  Left lateral flexion To knee, Increased pain L side To knee  Right rotation No pain 25% limit  Left rotation No pain  25% limit   (Blank rows = not tested)  LOWER EXTREMITY ROM:     Active  Right eval Left eval  Hip flexion     Hip extension    Hip abduction    Hip adduction  Hip internal rotation 20 20  Hip external rotation 80 50  Knee flexion 125 100  Knee extension 0 Lacking 14   Ankle dorsiflexion    Ankle plantarflexion    Ankle inversion    Ankle eversion     (Blank rows = not tested)  LOWER EXTREMITY MMT:    MMT Right * eval Left * eval  Hip flexion 5 5  Hip extension    Hip abduction 5 5  Hip adduction 5 5  Knee flexion 5 4  Knee extension 5 5  Ankle dorsiflexion 5 3  Ankle plantarflexion 5 3  Ankle inversion 5 3  Ankle eversion 5 2   (Blank rows = not tested) *reported increased pain with all resisted movements bil.   LUMBAR SPECIAL TESTS:  Straight leg raise test: Negative  FUNCTIONAL TESTS:  5 times sit to stand: 10.4 seconds  GAIT: Distance walked: 50' Assistive device utilized: Environmental consultant - 4 wheeled Level of assistance: Modified independence Comments: no longer wears AFO, L ankle tends to intoe  TODAY'S TREATMENT   01/18/22 Therapeutic Exercise: to improve strength and mobility.  Demo, verbal and tactile cues throughout for technique. Nustep L6 x 6 min  DGI: 19/24 Seated ankle AROM all directions x 10 bil Seated L toe spreads 2x10  Manual Therapy: STM to L plantar fascia  01/12/22 Therapeutic Exercise: to improve strength and mobility.  Demo, verbal and tactile cues throughout for technique. Nustep L6 x 6 min  Lumbar AROM Seated pallof press on dynadisc 2x10 Standing hip flexor stretch 3x15 BLE on step SLS R/L with opp arm shoulder press x 5 bil Star excursion 3x 1/2 circle each side with SBQC Gait- 90 ft with [facility]  12/27/2021 Therapeutic Exercise: to improve strength and mobility.  Demo, verbal and tactile cues throughout for technique. Nustep L6 x 6 min  Bridges  x 15, x 15 with feet on airex LTR x 10 SLR x 10 bil  Seated Low back flexion stretch with ball x 10 Ankle DF/PF x 10, Inv/ever x 10 (minA) BAPS board level 2  Neuromuscular Reeducation: to  improve balance and stability. SBA/CGA for safety throughout.  On Aerex: ankle sways, perturbations all directions, chops with yellow weighted ball x 10 bil Manual Therapy: to decrease muscle spasm and pain and improve mobility Gentle stretches to L foot, mobs to 1st ray, STM/TPR to plantar fascia.     PATIENT EDUCATION:  Education details: HEP update 12/20/21 Person educated: Patient Education method: Explanation, Demonstration, Verbal cues, and Handouts Education comprehension: verbalized understanding and returned demonstration   HOME EXERCISE PROGRAM: Access Code: [email or web address] URL: [email or web address] Date: 12/20/2021 Prepared by: Glenetta Hew  Exercises - Long Sitting Calf Stretch with Strap  - 1 x daily - 7 x weekly - 3 sets - 10 reps - Seated Toe Raise  - 1 x daily - 7 x weekly - 3 sets - 10 reps - Ankle Inversion Eversion Towel Slide  - 1 x daily - 7 x weekly - 3 sets - 10 reps - Seated Ankle Eversion with Resistance  - 1 x daily - 7 x weekly - 3 sets - 10 reps - Seated Ankle Inversion Eversion PROM  - 1 x daily - 7 x weekly - 3 sets - 10 reps - Seated Ankle Alphabet  - 1 x daily - 7 x weekly - 3 sets - 10 reps - Supine Posterior Pelvic Tilt  - 1 x daily - 7 x weekly - 2 sets - 10 reps -  Supine March with Posterior Pelvic Tilt  - 1 x daily - 7 x weekly - 2 sets - 10 reps - Dead Bug  - 1 x daily - 7 x weekly - 2 sets - 10 reps - Supine Bridge  - 1 x daily - 7 x weekly - 2 sets - 10 reps - Squat with Chair Touch and Resistance Loop  - 1 x daily - 7 x weekly - 3 sets - 10 reps - Ankle Circles on Wobble Board in Sitting  - 1 x daily - 7 x weekly - 3 sets - 10 reps  ASSESSMENT:  CLINICAL IMPRESSION: Mr Ospina is progressing toward goals. He reports improvement in LBP of 30%. He showed mild improvement in lumbar AROM with extension. DGI score improved to 19/24, showing less of a fall risk. Pt demonstrates increased antalgic gait and instability after prolonged  walking for ~5-10 min. He did report he is now able to sit for at least 1 hour w/o increased pain so he has met LTG#7. So far the remaining LTGs are in progress. He mentioned wanting to focus more on his L ankle/foot, as he is unable to extend his toes and flatten his feet which inhibit his balance.      OBJECTIVE IMPAIRMENTS Abnormal gait, decreased activity tolerance, decreased balance, decreased endurance, decreased mobility, difficulty walking, decreased ROM, decreased strength, hypomobility, increased fascial restrictions, increased muscle spasms, impaired flexibility, postural dysfunction, and pain.   ACTIVITY LIMITATIONS carrying, lifting, bending, sitting, standing, sleeping, stairs, transfers, dressing, and locomotion level  PARTICIPATION LIMITATIONS: meal prep, cleaning, laundry, driving, shopping, community activity, yard work, and travel  [place] Age, Past/current experiences, Time since onset of injury/illness/exacerbation, and 3+ comorbidities: COPD, chronic LBP, liver transplant, skin cancer, HTN, osteoporosis, history falls with injury, and surgical hardware  are also affecting patient's functional outcome.   REHAB POTENTIAL: Good  CLINICAL DECISION MAKING: Evolving/moderate complexity  EVALUATION COMPLEXITY: Moderate   GOALS: Goals reviewed with patient? Yes  SHORT TERM GOALS: Target date: 11/01/2021   Patient will be independent with initial HEP.  Baseline:  HEP given 10/25/21 Goal status: MET  good compliance   LONG TERM GOALS: Target date: 01/03/2022  extended to 03/15/22  Patient will be independent with advanced/ongoing HEP to improve outcomes and carryover.  Baseline:  Goal status: IN PROGRESS 01/18/22- met for current  2.  Patient will report 50% improvement in low back pain to improve QOL.  Baseline:  Goal status: IN PROGRESS- 30% improvement 01/18/22  3.  Patient will demonstrate full pain free lumbar ROM to perform ADLs.   Baseline: increased  pain with extension and SB Goal status: IN PROGRESS  4.  Patient will demonstrate improved functional strength and balance as demonstrated by DGI >19/24 to decrease risk of falls.  Baseline: 18/24 on 08/26/21 Goal status: IN PROGRESS  5.  Patient will report 43% on lumbar FOTO to demonstrate improved functional ability.  Baseline: 33% Goal status: IN PROGRESS   6.  Patient will tolerate 15 min of  walking to access community.  Baseline: increased pain after 5-10 min Goal status: IN PROGRESS- 15 min per pt 01/12/22  7.  Patient will be able to tolerate sitting >1 hour without increased LLE pain or swelling in order to travel.  Baseline: 30 min Goal status: MET 01/18/22    PLAN: PT FREQUENCY: 1-2x/week  PT DURATION: 8 weeks   PLANNED INTERVENTIONS: Therapeutic exercises, Therapeutic activity, Neuromuscular re-education, Balance training, Gait training, Patient/Family education, Self Care, Joint  mobilization, Stair training, Orthotic/Fit training, DME instructions, Aquatic Therapy, Dry Needling, Electrical stimulation, Spinal mobilization, Cryotherapy, Moist heat, Manual therapy, and Re-evaluation.  PLAN FOR NEXT SESSION: , continue LE/hip/core strengthening, balance training.    Rennie Natter, PT, DPT  01/18/2022, 5:13 PM   Artist Pais, PTA 01/18/2022, 2:51 PM

## 2022-01-18 NOTE — Addendum Note (Signed)
Addended by: Rennie Natter on: 01/18/2022 05:16 PM   Modules accepted: Orders

## 2022-02-01 ENCOUNTER — Ambulatory Visit: Payer: Medicare PPO | Attending: Emergency Medicine | Admitting: Physical Therapy

## 2022-02-01 ENCOUNTER — Encounter: Payer: Self-pay | Admitting: Physical Therapy

## 2022-02-01 DIAGNOSIS — R262 Difficulty in walking, not elsewhere classified: Secondary | ICD-10-CM | POA: Insufficient documentation

## 2022-02-01 DIAGNOSIS — M5442 Lumbago with sciatica, left side: Secondary | ICD-10-CM | POA: Insufficient documentation

## 2022-02-01 DIAGNOSIS — R2689 Other abnormalities of gait and mobility: Secondary | ICD-10-CM | POA: Diagnosis present

## 2022-02-01 DIAGNOSIS — M25562 Pain in left knee: Secondary | ICD-10-CM | POA: Diagnosis present

## 2022-02-01 DIAGNOSIS — G8929 Other chronic pain: Secondary | ICD-10-CM | POA: Insufficient documentation

## 2022-02-01 DIAGNOSIS — R2681 Unsteadiness on feet: Secondary | ICD-10-CM | POA: Diagnosis present

## 2022-02-01 DIAGNOSIS — M6281 Muscle weakness (generalized): Secondary | ICD-10-CM | POA: Insufficient documentation

## 2022-02-01 NOTE — Therapy (Signed)
OUTPATIENT PHYSICAL THERAPY TREATMENT  Patient Name: Andrew Banks MRN: [id number] DOB:[birth year], 72 y.o., male Today's Date: 02/01/2022   PT End of Session - 02/01/22 1359     Visit Number 10    Number of Visits 15    Date for PT Re-Evaluation 03/15/22    Authorization Type [facility], [employer]    Authorization Time Period [employer] auth pending    Authorization - Number of Visits 15    Progress Note Due on Visit 15   progress note done visit 9   PT Start Time 1400    PT Stop Time 1443    PT Time Calculation (min) 43 min    Activity Tolerance Patient tolerated treatment well    Behavior During Therapy WFL for tasks assessed/performed                 Past Medical History:  Diagnosis Date   Arthritis    Degenerative disc disease, lumbar    Hypertension    Liver transplanted [facility])    Skin cancer    Past Surgical History:  Procedure Laterality Date   liver transplant     There are no problems to display for this patient.   PCP: Felipa Eth, MD  REFERRING PROVIDER: [facility]  REFERRING DIAG: M54.50 (ICD-10-CM) - Low back pain, unspecified  Rationale for Evaluation and Treatment Rehabilitation  THERAPY DIAG:  Chronic left-sided low back pain with left-sided sciatica  Muscle weakness (generalized)  Unsteadiness on feet  Difficulty in walking, not elsewhere classified  ONSET DATE: chronic  SUBJECTIVE:                                                                                                                                                                                           SUBJECTIVE STATEMENT: Patient does not well today, went to [facility] for holidays, stayed in hotel, 5 blocks from sons, tried to walk distance but it was too much, has been trying to recover last few days.  Exhausted.    PERTINENT HISTORY:  History skin cancer, liver transplant, s/p ORIF Pelvic fx 11/12/19, s/p IMN L distal femur 08/12/2019, s/p L IM nailing femur  11/02/20, L foot drop,  lumbar DDD,   PAIN:  Are you having pain? Yes: NPRS scale: 5/10 Pain location: L side low back, hip, pelvis, 6/10 L knee, L foot 6/10  Pain description: constant ache low back/hip; sharp pain stabbing in lower leg,  Aggravating factors: standing/walking long periods >45 min  Relieving factors: sitting down   PRECAUTIONS: Fall  WEIGHT BEARING RESTRICTIONS No  FALLS:  Has patient fallen in last 6 months? No  LIVING ENVIRONMENT: Lives  with: lives with their spouse Lives in: House/apartment Stairs: Yes: External: 17 steps; on right going up, on left going up, and can reach both Has following equipment at home: Single point cane, Walker - 4 wheeled, Wheelchair (manual), Electronics engineer, and Grab bars  OCCUPATION: retired  PLOF: Independent with household mobility with device  PATIENT GOALS lesson pain, improve gait, sit to be able to travel longer distances   OBJECTIVE:   DIAGNOSTIC FINDINGS:  XR 12/22/20: 1.  Healing unstable traumatic U-shaped sacral fracture (spinopelvic dissociation) status post internal fixation with bilateral iliosacral screws at S1 and A left transsacral transiliac screw at S2. Both hips are located. No hardware complication. No gross change in alignment.   2.  Healed left ischiopubic ramus fracture.   3.  Healing peri-implant pertrochanteric left femoral fracture status post cephalomedullary nail fixation. No hardware is intact without loosening. No change in fracture alignment.   4.  Healed bicondylar distal femoral fracture (extending proximally to the distal third diaphysis) status post ORIF with lateral buttress plate and screws. No hardware complication or change in fracture alignment.   5.  Medial patellar fracture is is not visible on this examination and seen on the CT left knee dated 11/11/2019.   6.  Healed nondisplaced fibular neck fracture.   7.  No new fractures.   8.  No joint misalignment (both sacroiliac, pubic symphysis, and both hips, and left  knee).   9.  Status post embolization of multiple arteries (left L3 and L4 lumbar, right L4 lumbar, 2 branches of the left deep circumflex iliac artery, and bilateral iliolumbar arteries (per Interventional Radiology procedure note dated 11/10/2020) with multiple embolization coils in and around the anatomic pelvis. Vascular plug in the right groin.   10.  Status post ACL reconstruction. Femoral interference screw and partially imaged tibial screw are intact without loosening.   11.  Osteopenia.   12.  Moderate degenerative changes of the knee.   13.  Mild degenerative changes of the hips and pubic symphysis.   14.  Degenerative changes of the partially imaged lumbosacral spine.   15.  Partially imaged mesh tacks in the abdomen (along the ventral abdominal wall when correlating with prior CT abdomen/pelvis dated 11/16/2019).     PATIENT SURVEYS:  FOTO 33%, predicted outcome 43%  COGNITION:  Overall cognitive status: Within functional limits for tasks assessed     SENSATION: Not tested  MUSCLE LENGTH: Hamstrings: Right 90 deg; Left 90 deg  POSTURE: weight shift left  PALPATION: Tenderness L lower back, hypomobility lumbar spine.   LUMBAR ROM:   Active  AROM  eval AROM 01/12/22  Flexion To feet, increased pain with return To feet, inc pain coming up  Extension Limited 75%, increased pain Limited 50%  Right lateral flexion To knee, Increased pain L side To knee - increased pain  Left lateral flexion To knee, Increased pain L side To knee  Right rotation No pain 25% limit  Left rotation No pain  25% limit   (Blank rows = not tested)  LOWER EXTREMITY ROM:     Active  Right eval Left eval  Hip flexion    Hip extension    Hip abduction    Hip adduction    Hip internal rotation 20 20  Hip external rotation 80 50  Knee flexion 125 100  Knee extension 0 Lacking 14   Ankle dorsiflexion    Ankle plantarflexion    Ankle inversion    Ankle eversion     (  Blank rows = not  tested)  LOWER EXTREMITY MMT:    MMT Right * eval Left * eval  Hip flexion 5 5  Hip extension    Hip abduction 5 5  Hip adduction 5 5  Knee flexion 5 4  Knee extension 5 5  Ankle dorsiflexion 5 3  Ankle plantarflexion 5 3  Ankle inversion 5 3  Ankle eversion 5 2   (Blank rows = not tested) *reported increased pain with all resisted movements bil.   LUMBAR SPECIAL TESTS:  Straight leg raise test: Negative  FUNCTIONAL TESTS:  5 times sit to stand: 10.4 seconds  GAIT: Distance walked: 50' Assistive device utilized: Environmental consultant - 4 wheeled Level of assistance: Modified independence Comments: no longer wears AFO, L ankle tends to intoe  TODAY'S TREATMENT  02/01/22 Therapeutic Exercise: to improve strength and mobility.  Demo, verbal and tactile cues throughout for technique. Nustep L5 x 6 min  L Ankle exercises with BAPS board level 2 - DF/PF x 20, Inv/EVER x 10, CW/CCW x 10 each.   Seated ankle circles.   Manual Therapy: to decrease muscle spasm and pain and improve mobility STM/TPR to L tib anterior and peroneals, IASTM to plantar fascia, intrinsics, mobs to forefoot, 1st ray mobilization, gentle stretches.  Cross friction to peroneals to release peroneal nerve.   01/18/22 Therapeutic Exercise: to improve strength and mobility.  Demo, verbal and tactile cues throughout for technique. Nustep L6 x 6 min  DGI: 19/24 Seated ankle AROM all directions x 10 bil Seated L toe spreads 2x10  Manual Therapy: STM to L plantar fascia  01/12/22 Therapeutic Exercise: to improve strength and mobility.  Demo, verbal and tactile cues throughout for technique. Nustep L6 x 6 min  Lumbar AROM Seated pallof press on dynadisc 2x10 Standing hip flexor stretch 3x15 BLE on step SLS R/L with opp arm shoulder press x 5 bil Star excursion 3x 1/2 circle each side with SBQC Gait- 90 ft with [facility]  12/27/2021 Therapeutic Exercise: to improve strength and mobility.  Demo, verbal and tactile cues  throughout for technique. Nustep L6 x 6 min  Bridges  x 15, x 15 with feet on airex LTR x 10 SLR x 10 bil  Seated Low back flexion stretch with ball x 10 Ankle DF/PF x 10, Inv/ever x 10 (minA) BAPS board level 2  Neuromuscular Reeducation: to improve balance and stability. SBA/CGA for safety throughout.  On Aerex: ankle sways, perturbations all directions, chops with yellow weighted ball x 10 bil Manual Therapy: to decrease muscle spasm and pain and improve mobility Gentle stretches to L foot, mobs to 1st ray, STM/TPR to plantar fascia.     PATIENT EDUCATION:  Education details: HEP update 12/20/21 Person educated: Patient Education method: Explanation, Demonstration, Verbal cues, and Handouts Education comprehension: verbalized understanding and returned demonstration   HOME EXERCISE PROGRAM: Access Code: [email or web address] URL: [email or web address] Date: 12/20/2021 Prepared by: Glenetta Hew  Exercises - Long Sitting Calf Stretch with Strap  - 1 x daily - 7 x weekly - 3 sets - 10 reps - Seated Toe Raise  - 1 x daily - 7 x weekly - 3 sets - 10 reps - Ankle Inversion Eversion Towel Slide  - 1 x daily - 7 x weekly - 3 sets - 10 reps - Seated Ankle Eversion with Resistance  - 1 x daily - 7 x weekly - 3 sets - 10 reps - Seated Ankle Inversion Eversion PROM  - 1 x daily - 7 x  weekly - 3 sets - 10 reps - Seated Ankle Alphabet  - 1 x daily - 7 x weekly - 3 sets - 10 reps - Supine Posterior Pelvic Tilt  - 1 x daily - 7 x weekly - 2 sets - 10 reps - Supine March with Posterior Pelvic Tilt  - 1 x daily - 7 x weekly - 2 sets - 10 reps - Dead Bug  - 1 x daily - 7 x weekly - 2 sets - 10 reps - Supine Bridge  - 1 x daily - 7 x weekly - 2 sets - 10 reps - Squat with Chair Touch and Resistance Loop  - 1 x daily - 7 x weekly - 3 sets - 10 reps - Ankle Circles on Wobble Board in Sitting  - 1 x daily - 7 x weekly - 3 sets - 10 reps  ASSESSMENT:  CLINICAL IMPRESSION: Mr Sparacino  reported increased fatigue today after holiday and increased pain and cramping in L foot, so focused on ankle exercises today due to poor exercise tolerance.  He is demonstrating significant improvement in L ankle ROM and motor control despite increased tone in L ankle, and was able to perform level 2 on BAPS board with good control, although reported increased cramping.  Focused remaining session on manual therapy, noted trigger points throughout peroneals, he responded well and reported decreased pain following interventions.  Alcario Drought continues to demonstrate potential for improvement and would benefit from continued skilled therapy to address impairments.    OBJECTIVE IMPAIRMENTS Abnormal gait, decreased activity tolerance, decreased balance, decreased endurance, decreased mobility, difficulty walking, decreased ROM, decreased strength, hypomobility, increased fascial restrictions, increased muscle spasms, impaired flexibility, postural dysfunction, and pain.   ACTIVITY LIMITATIONS carrying, lifting, bending, sitting, standing, sleeping, stairs, transfers, dressing, and locomotion level  PARTICIPATION LIMITATIONS: meal prep, cleaning, laundry, driving, shopping, community activity, yard work, and travel  [place] Age, Past/current experiences, Time since onset of injury/illness/exacerbation, and 3+ comorbidities: COPD, chronic LBP, liver transplant, skin cancer, HTN, osteoporosis, history falls with injury, and surgical hardware  are also affecting patient's functional outcome.   REHAB POTENTIAL: Good  CLINICAL DECISION MAKING: Evolving/moderate complexity  EVALUATION COMPLEXITY: Moderate   GOALS: Goals reviewed with patient? Yes  SHORT TERM GOALS: Target date: 11/01/2021   Patient will be independent with initial HEP.  Baseline:  HEP given 10/25/21 Goal status: MET  good compliance   LONG TERM GOALS: Target date: 01/03/2022  extended to 03/15/22  Patient will be  independent with advanced/ongoing HEP to improve outcomes and carryover.  Baseline:  Goal status: IN PROGRESS 01/18/22- met for current  2.  Patient will report 50% improvement in low back pain to improve QOL.  Baseline:  Goal status: IN PROGRESS- 30% improvement 01/18/22  3.  Patient will demonstrate full pain free lumbar ROM to perform ADLs.   Baseline: increased pain with extension and SB Goal status: IN PROGRESS  4.  Patient will demonstrate improved functional strength and balance as demonstrated by DGI >19/24 to decrease risk of falls.  Baseline: 18/24 on 08/26/21 Goal status: IN PROGRESS  5.  Patient will report 43% on lumbar FOTO to demonstrate improved functional ability.  Baseline: 33% Goal status: IN PROGRESS   6.  Patient will tolerate 15 min of  walking to access community.  Baseline: increased pain after 5-10 min Goal status: IN PROGRESS- 15 min per pt 01/12/22  7.  Patient will be able to tolerate sitting >1 hour without increased LLE  pain or swelling in order to travel.  Baseline: 30 min Goal status: MET 01/18/22    PLAN: PT FREQUENCY: 1-2x/week  PT DURATION: 8 weeks   PLANNED INTERVENTIONS: Therapeutic exercises, Therapeutic activity, Neuromuscular re-education, Balance training, Gait training, Patient/Family education, Self Care, Joint mobilization, Stair training, Orthotic/Fit training, DME instructions, Aquatic Therapy, Dry Needling, Electrical stimulation, Spinal mobilization, Cryotherapy, Moist heat, Manual therapy, and Re-evaluation.  PLAN FOR NEXT SESSION: , continue LE/hip/core strengthening, balance training.    Rennie Natter, PT, DPT  02/01/2022, 3:44 PM

## 2022-02-15 ENCOUNTER — Ambulatory Visit: Payer: Medicare PPO | Admitting: Physical Therapy

## 2022-02-15 ENCOUNTER — Encounter: Payer: Self-pay | Admitting: Physical Therapy

## 2022-02-15 DIAGNOSIS — R2681 Unsteadiness on feet: Secondary | ICD-10-CM

## 2022-02-15 DIAGNOSIS — R2689 Other abnormalities of gait and mobility: Secondary | ICD-10-CM

## 2022-02-15 DIAGNOSIS — R262 Difficulty in walking, not elsewhere classified: Secondary | ICD-10-CM

## 2022-02-15 DIAGNOSIS — M5442 Lumbago with sciatica, left side: Secondary | ICD-10-CM | POA: Diagnosis not present

## 2022-02-15 DIAGNOSIS — M6281 Muscle weakness (generalized): Secondary | ICD-10-CM

## 2022-02-15 DIAGNOSIS — G8929 Other chronic pain: Secondary | ICD-10-CM

## 2022-02-15 NOTE — Therapy (Signed)
OUTPATIENT PHYSICAL THERAPY TREATMENT  Patient Name: Andrew Banks MRN: [id number] DOB:06-19-1950, 72 y.o., male Today's Date: 02/15/2022   PT End of Session - 02/15/22 1315     Visit Number 11    Number of Visits 15    Date for PT Re-Evaluation 03/15/22    Authorization Type [facility], [employer]    Authorization Time Period [employer] auth pending    Authorization - Number of Visits 15    Progress Note Due on Visit 15    PT Start Time 1316    PT Stop Time 1401    PT Time Calculation (min) 45 min    Activity Tolerance Patient tolerated treatment well    Behavior During Therapy WFL for tasks assessed/performed                 Past Medical History:  Diagnosis Date   Arthritis    Degenerative disc disease, lumbar    Hypertension    Liver transplanted [facility])    Skin cancer    Past Surgical History:  Procedure Laterality Date   liver transplant     There are no problems to display for this patient.   PCP: Felipa Eth, MD  REFERRING PROVIDER: [facility]  REFERRING DIAG: M54.50 (ICD-10-CM) - Low back pain, unspecified  Rationale for Evaluation and Treatment Rehabilitation  THERAPY DIAG:  Chronic left-sided low back pain with left-sided sciatica  Muscle weakness (generalized)  Unsteadiness on feet  Difficulty in walking, not elsewhere classified  Chronic pain of left knee  Other abnormalities of gait and mobility  ONSET DATE: chronic  SUBJECTIVE:                                                                                                                                                                                           SUBJECTIVE STATEMENT:  Feels good today and legs feel better after last session. Weather is affecting his pain a little bit. Back hurts today, again thinks the weather is making it worse.   PERTINENT HISTORY:  History skin cancer, liver transplant, s/p ORIF Pelvic fx 11/12/19, s/p IMN L distal femur 08/12/2019, s/p L IM nailing femur  11/02/20,  L foot drop, lumbar DDD,   PAIN:  Are you having pain? Yes: NPRS scale: 5/10 Pain location: L side low back, hip, pelvis, 6/10 L knee, L foot 6-7/10  Pain description: constant ache low back/hip; sharp pain stabbing in lower leg,  Aggravating factors: standing/walking long periods >45 min  Relieving factors: sitting down   PRECAUTIONS: Fall  WEIGHT BEARING RESTRICTIONS No  FALLS:  Has patient fallen in last 6 months? No  LIVING ENVIRONMENT: Lives  with: lives with their spouse Lives in: House/apartment Stairs: Yes: External: 17 steps; on right going up, on left going up, and can reach both Has following equipment at home: Single point cane, Walker - 4 wheeled, Wheelchair (manual), Electronics engineer, and Grab bars  OCCUPATION: retired  PLOF: Independent with household mobility with device  PATIENT GOALS lesson pain, improve gait, sit to be able to travel longer distances   OBJECTIVE:   DIAGNOSTIC FINDINGS:  XR 12/22/20: 1.  Healing unstable traumatic U-shaped sacral fracture (spinopelvic dissociation) status post internal fixation with bilateral iliosacral screws at S1 and A left transsacral transiliac screw at S2. Both hips are located. No hardware complication. No gross change in alignment.   2.  Healed left ischiopubic ramus fracture.   3.  Healing peri-implant pertrochanteric left femoral fracture status post cephalomedullary nail fixation. No hardware is intact without loosening. No change in fracture alignment.   4.  Healed bicondylar distal femoral fracture (extending proximally to the distal third diaphysis) status post ORIF with lateral buttress plate and screws. No hardware complication or change in fracture alignment.   5.  Medial patellar fracture is is not visible on this examination and seen on the CT left knee dated 11/11/2019.   6.  Healed nondisplaced fibular neck fracture.   7.  No new fractures.   8.  No joint misalignment (both sacroiliac, pubic symphysis, and both hips,  and left knee).   9.  Status post embolization of multiple arteries (left L3 and L4 lumbar, right L4 lumbar, 2 branches of the left deep circumflex iliac artery, and bilateral iliolumbar arteries (per Interventional Radiology procedure note dated 11/10/2020) with multiple embolization coils in and around the anatomic pelvis. Vascular plug in the right groin.   10.  Status post ACL reconstruction. Femoral interference screw and partially imaged tibial screw are intact without loosening.   11.  Osteopenia.   12.  Moderate degenerative changes of the knee.   13.  Mild degenerative changes of the hips and pubic symphysis.   14.  Degenerative changes of the partially imaged lumbosacral spine.   15.  Partially imaged mesh tacks in the abdomen (along the ventral abdominal wall when correlating with prior CT abdomen/pelvis dated 11/16/2019).     PATIENT SURVEYS:  FOTO 33%, predicted outcome 43%  COGNITION:  Overall cognitive status: Within functional limits for tasks assessed     SENSATION: Not tested  MUSCLE LENGTH: Hamstrings: Right 90 deg; Left 90 deg  POSTURE: weight shift left  PALPATION: Tenderness L lower back, hypomobility lumbar spine.   LUMBAR ROM:   Active  AROM  eval AROM 01/12/22  Flexion To feet, increased pain with return To feet, inc pain coming up  Extension Limited 75%, increased pain Limited 50%  Right lateral flexion To knee, Increased pain L side To knee - increased pain  Left lateral flexion To knee, Increased pain L side To knee  Right rotation No pain 25% limit  Left rotation No pain  25% limit   (Blank rows = not tested)  LOWER EXTREMITY ROM:     Active  Right eval Left eval  Hip flexion    Hip extension    Hip abduction    Hip adduction    Hip internal rotation 20 20  Hip external rotation 80 50  Knee flexion 125 100  Knee extension 0 Lacking 14   Ankle dorsiflexion    Ankle plantarflexion    Ankle inversion    Ankle eversion     (  Blank rows = not  tested)  LOWER EXTREMITY MMT:    MMT Right * eval Left * eval  Hip flexion 5 5  Hip extension    Hip abduction 5 5  Hip adduction 5 5  Knee flexion 5 4  Knee extension 5 5  Ankle dorsiflexion 5 3  Ankle plantarflexion 5 3  Ankle inversion 5 3  Ankle eversion 5 2   (Blank rows = not tested) *reported increased pain with all resisted movements bil.   LUMBAR SPECIAL TESTS:  Straight leg raise test: Negative  FUNCTIONAL TESTS:  5 times sit to stand: 10.4 seconds  GAIT: Distance walked: 50' Assistive device utilized: Environmental consultant - 4 wheeled Level of assistance: Modified independence Comments: no longer wears AFO, L ankle tends to intoe  TODAY'S TREATMENT  02/15/22 Therapeutic Exercise: to improve strength and mobility.  Demo,  verbal and tactile cues throughout for technique.  Nustep L5 x 8 min  Seated HS Curls #30 2x10  Seated Knee Extension #25 2x10  Neuromuscular Reeducation: to improve balance and stability. SBA/CGA for safety throughout.  Toe Taps on Yoga block w/ Mirror 2x10 bilat-cue for posture  [employer] w/ Mirror: Fwd/Bwd & S/S: 2x10 bilat-cue for posture using theraband to step over Manual Therapy: to decrease muscle spasm and pain and improve mobility STM/TPR to L tib anterior and peroneals, IASTM to plantar fascia, intrinsics, mobs to forefoot, 1st ray mobilization, gentle stretches.    02/01/22 Therapeutic Exercise: to improve strength and mobility.  Demo, verbal and tactile cues throughout for technique. Nustep L5 x 6 min  L Ankle exercises with BAPS board level 2 - DF/PF x 20, Inv/EVER x 10, CW/CCW x 10 each.   Seated ankle circles.   Manual Therapy: to decrease muscle spasm and pain and improve mobility STM/TPR to L tib anterior and peroneals, IASTM to plantar fascia, intrinsics, mobs to forefoot, 1st ray mobilization, gentle stretches.  Cross friction to peroneals to release peroneal nerve.   01/18/22 Therapeutic Exercise: to improve strength and  mobility.  Demo, verbal and tactile cues throughout for technique. Nustep L6 x 6 min  DGI: 19/24 Seated ankle AROM all directions x 10 bil Seated L toe spreads 2x10  Manual Therapy: STM to L plantar fascia   PATIENT EDUCATION:  Education details: HEP update 12/20/21 Person educated: Patient Education method: Explanation, Demonstration, Verbal cues, and Handouts Education comprehension: verbalized understanding and returned demonstration   HOME EXERCISE PROGRAM: Access Code: [email or web address] URL: [email or web address] Date: 12/20/2021 Prepared by: Glenetta Hew  Exercises - Long Sitting Calf Stretch with Strap  - 1 x daily - 7 x weekly - 3 sets - 10 reps - Seated Toe Raise  - 1 x daily - 7 x weekly - 3 sets - 10 reps - Ankle Inversion Eversion Towel Slide  - 1 x daily - 7 x weekly - 3 sets - 10 reps - Seated Ankle Eversion with Resistance  - 1 x daily - 7 x weekly - 3 sets - 10 reps - Seated Ankle Inversion Eversion PROM  - 1 x daily - 7 x weekly - 3 sets - 10 reps - Seated Ankle Alphabet  - 1 x daily - 7 x weekly - 3 sets - 10 reps - Supine Posterior Pelvic Tilt  - 1 x daily - 7 x weekly - 2 sets - 10 reps - Supine March with Posterior Pelvic Tilt  - 1 x daily - 7 x weekly - 2 sets - 10 reps - Dead  Bug  - 1 x daily - 7 x weekly - 2 sets - 10 reps - Supine Bridge  - 1 x daily - 7 x weekly - 2 sets - 10 reps - Squat with Chair Touch and Resistance Loop  - 1 x daily - 7 x weekly - 3 sets - 10 reps - Ankle Circles on Wobble Board in Sitting  - 1 x daily - 7 x weekly - 3 sets - 10 reps  ASSESSMENT:  CLINICAL IMPRESSION: Mr. Velardi reported more aches and pain due to weather, especially in L foot today. He responded well to the strengthening and balance exercises with the mirror and continues to show enthusiasm and motivation to keep improving.  Reported decreased pain and spasm in L foot after manual therapy, and reported improved gait.  Alcario Drought continues to  demonstrate potential for improvement and would benefit from continued skilled therapy to address impairments.    OBJECTIVE IMPAIRMENTS Abnormal gait, decreased activity tolerance, decreased balance, decreased endurance, decreased mobility, difficulty walking, decreased ROM, decreased strength, hypomobility, increased fascial restrictions, increased muscle spasms, impaired flexibility, postural dysfunction, and pain.   ACTIVITY LIMITATIONS carrying, lifting, bending, sitting, standing, sleeping, stairs, transfers, dressing, and locomotion level  PARTICIPATION LIMITATIONS: meal prep, cleaning, laundry, driving, shopping, community activity, yard work, and travel  [place] Age, Past/current experiences, Time since onset of injury/illness/exacerbation, and 3+ comorbidities: COPD, chronic LBP, liver transplant, skin cancer, HTN, osteoporosis, history falls with injury, and surgical hardware  are also affecting patient's functional outcome.   REHAB POTENTIAL: Good  CLINICAL DECISION MAKING: Evolving/moderate complexity  EVALUATION COMPLEXITY: Moderate   GOALS: Goals reviewed with patient? Yes  SHORT TERM GOALS: Target date: 11/01/2021   Patient will be independent with initial HEP.  Baseline:  HEP given 10/25/21 Goal status: MET  good compliance   LONG TERM GOALS: Target date: 01/03/2022  extended to 03/15/22  Patient will be independent with advanced/ongoing HEP to improve outcomes and carryover.  Baseline:  Goal status: IN PROGRESS 01/18/22- met for current  2.  Patient will report 50% improvement in low back pain to improve QOL.  Baseline:  Goal status: IN PROGRESS- 30% improvement 01/18/22  3.  Patient will demonstrate full pain free lumbar ROM to perform ADLs.   Baseline: increased pain with extension and SB Goal status: IN PROGRESS  4.  Patient will demonstrate improved functional strength and balance as demonstrated by DGI >19/24 to decrease risk of falls.  Baseline:  18/24 on 08/26/21 Goal status: IN PROGRESS  5.  Patient will report 43% on lumbar FOTO to demonstrate improved functional ability.  Baseline: 33% Goal status: IN PROGRESS   6.  Patient will tolerate 15 min of  walking to access community.  Baseline: increased pain after 5-10 min Goal status: IN PROGRESS- 15 min per pt 01/12/22  7.  Patient will be able to tolerate sitting >1 hour without increased LLE pain or swelling in order to travel.  Baseline: 30 min Goal status: MET 01/18/22    PLAN: PT FREQUENCY: 1-2x/week  PT DURATION: 8 weeks   PLANNED INTERVENTIONS: Therapeutic exercises, Therapeutic activity, Neuromuscular re-education, Balance training, Gait training, Patient/Family education, Self Care, Joint mobilization, Stair training, Orthotic/Fit training, DME instructions, Aquatic Therapy, Dry Needling, Electrical stimulation, Spinal mobilization, Cryotherapy, Moist heat, Manual therapy, and Re-evaluation.  PLAN FOR NEXT SESSION: continue to progress with LE/hip/core strengthening and incorporate more balance training.    Rennie Natter, PT, DPT  02/15/2022, 4:26 PM

## 2022-02-22 ENCOUNTER — Ambulatory Visit: Payer: Medicare PPO

## 2022-02-22 DIAGNOSIS — R2689 Other abnormalities of gait and mobility: Secondary | ICD-10-CM

## 2022-02-22 DIAGNOSIS — M5442 Lumbago with sciatica, left side: Secondary | ICD-10-CM | POA: Diagnosis not present

## 2022-02-22 DIAGNOSIS — R2681 Unsteadiness on feet: Secondary | ICD-10-CM

## 2022-02-22 DIAGNOSIS — M6281 Muscle weakness (generalized): Secondary | ICD-10-CM

## 2022-02-22 DIAGNOSIS — R262 Difficulty in walking, not elsewhere classified: Secondary | ICD-10-CM

## 2022-02-22 DIAGNOSIS — G8929 Other chronic pain: Secondary | ICD-10-CM

## 2022-02-22 NOTE — Therapy (Signed)
OUTPATIENT PHYSICAL THERAPY TREATMENT  Patient Name: Andrew Banks MRN: [id number] DOB:1950/03/07, 72 y.o., male Today's Date: 02/22/2022   PT End of Session - 02/22/22 1402     Visit Number 12    Number of Visits 15    Date for PT Re-Evaluation 03/15/22    Authorization Type [facility], [employer]    Authorization Time Period [employer] auth pending    Authorization - Number of Visits 15    Progress Note Due on Visit 15    PT Start Time 1318    PT Stop Time 1359    PT Time Calculation (min) 41 min    Activity Tolerance Patient tolerated treatment well    Behavior During Therapy WFL for tasks assessed/performed                  Past Medical History:  Diagnosis Date   Arthritis    Degenerative disc disease, lumbar    Hypertension    Liver transplanted [facility])    Skin cancer    Past Surgical History:  Procedure Laterality Date   liver transplant     There are no problems to display for this patient.   PCP: Felipa Eth, MD  REFERRING PROVIDER: [facility]  REFERRING DIAG: M54.50 (ICD-10-CM) - Low back pain, unspecified  Rationale for Evaluation and Treatment Rehabilitation  THERAPY DIAG:  Chronic left-sided low back pain with left-sided sciatica  Muscle weakness (generalized)  Unsteadiness on feet  Difficulty in walking, not elsewhere classified  Chronic pain of left knee  Other abnormalities of gait and mobility  ONSET DATE: chronic  SUBJECTIVE:                                                                                                                                                                                           SUBJECTIVE STATEMENT:   PERTINENT HISTORY:  History skin cancer, liver transplant, s/p ORIF Pelvic fx 11/12/19, s/p IMN L distal femur 08/12/2019, s/p L IM nailing femur  11/02/20, L foot drop, lumbar DDD,   PAIN:  Are you having pain? Yes: NPRS scale: 5/10 Pain location: L side low back, hip, pelvis, 6/10 L knee, L foot 6-7/10  Pain  description: constant ache low back/hip; sharp pain stabbing in lower leg,  Aggravating factors: standing/walking long periods >45 min  Relieving factors: sitting down   PRECAUTIONS: Fall  WEIGHT BEARING RESTRICTIONS No  FALLS:  Has patient fallen in last 6 months? No  LIVING ENVIRONMENT: Lives with: lives with their spouse Lives in: House/apartment Stairs: Yes: External: 17 steps; on right going up, on left going up, and can reach both Has following equipment at  home: Single point cane, Walker - 4 wheeled, Wheelchair (manual), Electronics engineer, and Grab bars  OCCUPATION: retired  PLOF: Independent with household mobility with device  PATIENT GOALS lesson pain, improve gait, sit to be able to travel longer distances   OBJECTIVE:   DIAGNOSTIC FINDINGS:  XR 12/22/20: 1.  Healing unstable traumatic U-shaped sacral fracture (spinopelvic dissociation) status post internal fixation with bilateral iliosacral screws at S1 and A left transsacral transiliac screw at S2. Both hips are located. No hardware complication. No gross change in alignment.   2.  Healed left ischiopubic ramus fracture.   3.  Healing peri-implant pertrochanteric left femoral fracture status post cephalomedullary nail fixation. No hardware is intact without loosening. No change in fracture alignment.   4.  Healed bicondylar distal femoral fracture (extending proximally to the distal third diaphysis) status post ORIF with lateral buttress plate and screws. No hardware complication or change in fracture alignment.   5.  Medial patellar fracture is is not visible on this examination and seen on the CT left knee dated 11/11/2019.   6.  Healed nondisplaced fibular neck fracture.   7.  No new fractures.   8.  No joint misalignment (both sacroiliac, pubic symphysis, and both hips, and left knee).   9.  Status post embolization of multiple arteries (left L3 and L4 lumbar, right L4 lumbar, 2 branches of the left deep circumflex iliac  artery, and bilateral iliolumbar arteries (per Interventional Radiology procedure note dated 11/10/2020) with multiple embolization coils in and around the anatomic pelvis. Vascular plug in the right groin.   10.  Status post ACL reconstruction. Femoral interference screw and partially imaged tibial screw are intact without loosening.   11.  Osteopenia.   12.  Moderate degenerative changes of the knee.   13.  Mild degenerative changes of the hips and pubic symphysis.   14.  Degenerative changes of the partially imaged lumbosacral spine.   15.  Partially imaged mesh tacks in the abdomen (along the ventral abdominal wall when correlating with prior CT abdomen/pelvis dated 11/16/2019).     PATIENT SURVEYS:  FOTO 33%, predicted outcome 43%  COGNITION:  Overall cognitive status: Within functional limits for tasks assessed     SENSATION: Not tested  MUSCLE LENGTH: Hamstrings: Right 90 deg; Left 90 deg  POSTURE: weight shift left  PALPATION: Tenderness L lower back, hypomobility lumbar spine.   LUMBAR ROM:   Active  AROM  eval AROM 01/12/22  Flexion To feet, increased pain with return To feet, inc pain coming up  Extension Limited 75%, increased pain Limited 50%  Right lateral flexion To knee, Increased pain L side To knee - increased pain  Left lateral flexion To knee, Increased pain L side To knee  Right rotation No pain 25% limit  Left rotation No pain  25% limit   (Blank rows = not tested)  LOWER EXTREMITY ROM:     Active  Right eval Left eval  Hip flexion    Hip extension    Hip abduction    Hip adduction    Hip internal rotation 20 20  Hip external rotation 80 50  Knee flexion 125 100  Knee extension 0 Lacking 14   Ankle dorsiflexion    Ankle plantarflexion    Ankle inversion    Ankle eversion     (Blank rows = not tested)  LOWER EXTREMITY MMT:    MMT Right * eval Left * eval  Hip flexion 5 5  Hip extension  Hip abduction 5 5  Hip adduction 5 5  Knee  flexion 5 4  Knee extension 5 5  Ankle dorsiflexion 5 3  Ankle plantarflexion 5 3  Ankle inversion 5 3  Ankle eversion 5 2   (Blank rows = not tested) *reported increased pain with all resisted movements bil.   LUMBAR SPECIAL TESTS:  Straight leg raise test: Negative  FUNCTIONAL TESTS:  5 times sit to stand: 10.4 seconds  GAIT: Distance walked: 50' Assistive device utilized: Environmental consultant - 4 wheeled Level of assistance: Modified independence Comments: no longer wears AFO, L ankle tends to intoe  TODAY'S TREATMENT  02/22/22 Therapeutic Exercise: to improve strength and mobility.  Demo,  verbal and tactile cues throughout for technique.  Nustep L6 x 8 min Standing bird dog 2x10 bil Standing marches x 10 bil Standing HS curls 2x10 bil Standing hip abduction 2x10 bil Standing LE tap to cones anterior, medial, and lateral 10x bil Knee flexion 25# 2x10 Knee extension 15# 2x10 Gait 360' 4WW  02/15/22 Therapeutic Exercise: to improve strength and mobility.  Demo,  verbal and tactile cues throughout for technique.  Nustep L5 x 8 min  Seated HS Curls #30 2x10  Seated Knee Extension #25 2x10  Neuromuscular Reeducation: to improve balance and stability. SBA/CGA for safety throughout.  Toe Taps on Yoga block w/ Mirror 2x10 bilat-cue for posture  [employer] w/ Mirror: Fwd/Bwd & S/S: 2x10 bilat-cue for posture using theraband to step over Manual Therapy: to decrease muscle spasm and pain and improve mobility STM/TPR to L tib anterior and peroneals, IASTM to plantar fascia, intrinsics, mobs to forefoot, 1st ray mobilization, gentle stretches.    02/01/22 Therapeutic Exercise: to improve strength and mobility.  Demo, verbal and tactile cues throughout for technique. Nustep L5 x 6 min  L Ankle exercises with BAPS board level 2 - DF/PF x 20, Inv/EVER x 10, CW/CCW x 10 each.   Seated ankle circles.   Manual Therapy: to decrease muscle spasm and pain and improve mobility STM/TPR to L tib  anterior and peroneals, IASTM to plantar fascia, intrinsics, mobs to forefoot, 1st ray mobilization, gentle stretches.  Cross friction to peroneals to release peroneal nerve.    PATIENT EDUCATION:  Education details: HEP update 12/20/21 Person educated: Patient Education method: Explanation, Demonstration, Verbal cues, and Handouts Education comprehension: verbalized understanding and returned demonstration   HOME EXERCISE PROGRAM: Access Code: [email or web address] URL: [email or web address] Date: 12/20/2021 Prepared by: Glenetta Hew  Exercises - Long Sitting Calf Stretch with Strap  - 1 x daily - 7 x weekly - 3 sets - 10 reps - Seated Toe Raise  - 1 x daily - 7 x weekly - 3 sets - 10 reps - Ankle Inversion Eversion Towel Slide  - 1 x daily - 7 x weekly - 3 sets - 10 reps - Seated Ankle Eversion with Resistance  - 1 x daily - 7 x weekly - 3 sets - 10 reps - Seated Ankle Inversion Eversion PROM  - 1 x daily - 7 x weekly - 3 sets - 10 reps - Seated Ankle Alphabet  - 1 x daily - 7 x weekly - 3 sets - 10 reps - Supine Posterior Pelvic Tilt  - 1 x daily - 7 x weekly - 2 sets - 10 reps - Supine March with Posterior Pelvic Tilt  - 1 x daily - 7 x weekly - 2 sets - 10 reps - Dead Bug  - 1 x daily - 7 x weekly -  2 sets - 10 reps - Supine Bridge  - 1 x daily - 7 x weekly - 2 sets - 10 reps - Squat with Chair Touch and Resistance Loop  - 1 x daily - 7 x weekly - 3 sets - 10 reps - Ankle Circles on Wobble Board in Sitting  - 1 x daily - 7 x weekly - 3 sets - 10 reps  ASSESSMENT:  CLINICAL IMPRESSION:  Pt showed good response to progression of balance and proximal LE strengthening. Close supervision and cues provided as needed. It was very difficult for him to tap cones with R LE standing on L w/o UE support. He would continue to benefit from skilled PT to address deficits.  OBJECTIVE IMPAIRMENTS Abnormal gait, decreased activity tolerance, decreased balance, decreased endurance,  decreased mobility, difficulty walking, decreased ROM, decreased strength, hypomobility, increased fascial restrictions, increased muscle spasms, impaired flexibility, postural dysfunction, and pain.   ACTIVITY LIMITATIONS carrying, lifting, bending, sitting, standing, sleeping, stairs, transfers, dressing, and locomotion level  PARTICIPATION LIMITATIONS: meal prep, cleaning, laundry, driving, shopping, community activity, yard work, and travel  [place] Age, Past/current experiences, Time since onset of injury/illness/exacerbation, and 3+ comorbidities: COPD, chronic LBP, liver transplant, skin cancer, HTN, osteoporosis, history falls with injury, and surgical hardware  are also affecting patient's functional outcome.   REHAB POTENTIAL: Good  CLINICAL DECISION MAKING: Evolving/moderate complexity  EVALUATION COMPLEXITY: Moderate   GOALS: Goals reviewed with patient? Yes  SHORT TERM GOALS: Target date: 11/01/2021   Patient will be independent with initial HEP.  Baseline:  HEP given 10/25/21 Goal status: MET  good compliance   LONG TERM GOALS: Target date: 01/03/2022  extended to 03/15/22  Patient will be independent with advanced/ongoing HEP to improve outcomes and carryover.  Baseline:  Goal status: IN PROGRESS 01/18/22- met for current  2.  Patient will report 50% improvement in low back pain to improve QOL.  Baseline:  Goal status: IN PROGRESS- 30% improvement 01/18/22  3.  Patient will demonstrate full pain free lumbar ROM to perform ADLs.   Baseline: increased pain with extension and SB Goal status: IN PROGRESS  4.  Patient will demonstrate improved functional strength and balance as demonstrated by DGI >19/24 to decrease risk of falls.  Baseline: 18/24 on 08/26/21 Goal status: IN PROGRESS  5.  Patient will report 43% on lumbar FOTO to demonstrate improved functional ability.  Baseline: 33% Goal status: IN PROGRESS   6.  Patient will tolerate 15 min of  walking  to access community.  Baseline: increased pain after 5-10 min Goal status: IN PROGRESS- 15 min per pt 01/12/22  7.  Patient will be able to tolerate sitting >1 hour without increased LLE pain or swelling in order to travel.  Baseline: 30 min Goal status: MET 01/18/22    PLAN: PT FREQUENCY: 1-2x/week  PT DURATION: 8 weeks   PLANNED INTERVENTIONS: Therapeutic exercises, Therapeutic activity, Neuromuscular re-education, Balance training, Gait training, Patient/Family education, Self Care, Joint mobilization, Stair training, Orthotic/Fit training, DME instructions, Aquatic Therapy, Dry Needling, Electrical stimulation, Spinal mobilization, Cryotherapy, Moist heat, Manual therapy, and Re-evaluation.  PLAN FOR NEXT SESSION: continue to progress with LE/hip/core strengthening and incorporate more balance training.    Artist Pais, PTA 02/22/2022, 2:57 PM

## 2022-03-01 ENCOUNTER — Ambulatory Visit: Payer: Medicare PPO | Admitting: Physical Therapy

## 2022-03-01 ENCOUNTER — Encounter: Payer: Self-pay | Admitting: Physical Therapy

## 2022-03-01 DIAGNOSIS — R2689 Other abnormalities of gait and mobility: Secondary | ICD-10-CM

## 2022-03-01 DIAGNOSIS — M5442 Lumbago with sciatica, left side: Secondary | ICD-10-CM | POA: Diagnosis not present

## 2022-03-01 DIAGNOSIS — R2681 Unsteadiness on feet: Secondary | ICD-10-CM

## 2022-03-01 DIAGNOSIS — M6281 Muscle weakness (generalized): Secondary | ICD-10-CM

## 2022-03-01 DIAGNOSIS — G8929 Other chronic pain: Secondary | ICD-10-CM

## 2022-03-01 DIAGNOSIS — R262 Difficulty in walking, not elsewhere classified: Secondary | ICD-10-CM

## 2022-03-01 NOTE — Therapy (Signed)
OUTPATIENT PHYSICAL THERAPY TREATMENT  Patient Name: Andrew Banks MRN: [id number] DOB:07-08-1950, 72 y.o., male Today's Date: 03/01/2022   PT End of Session - 03/01/22 1319     Visit Number 13    Number of Visits 15    Date for PT Re-Evaluation 03/15/22    Authorization Type [facility], [employer]    Authorization Time Period [employer] auth pending    Authorization - Number of Visits 15    Progress Note Due on Visit 15    PT Start Time 1316    PT Stop Time 1403    PT Time Calculation (min) 47 min    Activity Tolerance Patient tolerated treatment well    Behavior During Therapy WFL for tasks assessed/performed                  Past Medical History:  Diagnosis Date   Arthritis    Degenerative disc disease, lumbar    Hypertension    Liver transplanted [facility])    Skin cancer    Past Surgical History:  Procedure Laterality Date   liver transplant     There are no problems to display for this patient.   PCP: Felipa Eth, MD  REFERRING PROVIDER: [facility]  REFERRING DIAG: M54.50 (ICD-10-CM) - Low back pain, unspecified  Rationale for Evaluation and Treatment Rehabilitation  THERAPY DIAG:  Chronic left-sided low back pain with left-sided sciatica  Muscle weakness (generalized)  Unsteadiness on feet  Difficulty in walking, not elsewhere classified  Chronic pain of left knee  Other abnormalities of gait and mobility  ONSET DATE: chronic  SUBJECTIVE:                                                                                                                                                                                           SUBJECTIVE STATEMENT:  "They've decided I have osteoporosis now and are going to start infusions."  Hurting today, couldn't sleep last night because hurting so much - both knees, spine, L hip- not tolerating weather well.     PERTINENT HISTORY:  History skin cancer, liver transplant, s/p ORIF Pelvic fx 11/12/19, s/p IMN L distal femur  08/12/2019, s/p L IM nailing femur  11/02/20, L foot drop, lumbar DDD,   PAIN:  Are you having pain? Yes: NPRS scale: 7/10 Pain location: L side low back, hip, pelvis, 7/10 L knee, L foot 7/10  Pain description: constant ache low back/hip; sharp pain stabbing in lower leg,  Aggravating factors: standing/walking long periods >45 min  Relieving factors: sitting down   PRECAUTIONS: Fall  WEIGHT BEARING RESTRICTIONS No  FALLS:  Has patient fallen in last  6 months? No  LIVING ENVIRONMENT: Lives with: lives with their spouse Lives in: House/apartment Stairs: Yes: External: 17 steps; on right going up, on left going up, and can reach both Has following equipment at home: Single point cane, Walker - 4 wheeled, Wheelchair (manual), Electronics engineer, and Grab bars  OCCUPATION: retired  PLOF: Independent with household mobility with device  PATIENT GOALS lesson pain, improve gait, sit to be able to travel longer distances   OBJECTIVE:   DIAGNOSTIC FINDINGS:  XR 12/22/20: 1.  Healing unstable traumatic U-shaped sacral fracture (spinopelvic dissociation) status post internal fixation with bilateral iliosacral screws at S1 and A left transsacral transiliac screw at S2. Both hips are located. No hardware complication. No gross change in alignment.   2.  Healed left ischiopubic ramus fracture.   3.  Healing peri-implant pertrochanteric left femoral fracture status post cephalomedullary nail fixation. No hardware is intact without loosening. No change in fracture alignment.   4.  Healed bicondylar distal femoral fracture (extending proximally to the distal third diaphysis) status post ORIF with lateral buttress plate and screws. No hardware complication or change in fracture alignment.   5.  Medial patellar fracture is is not visible on this examination and seen on the CT left knee dated 11/11/2019.   6.  Healed nondisplaced fibular neck fracture.   7.  No new fractures.   8.  No joint misalignment (both  sacroiliac, pubic symphysis, and both hips, and left knee).   9.  Status post embolization of multiple arteries (left L3 and L4 lumbar, right L4 lumbar, 2 branches of the left deep circumflex iliac artery, and bilateral iliolumbar arteries (per Interventional Radiology procedure note dated 11/10/2020) with multiple embolization coils in and around the anatomic pelvis. Vascular plug in the right groin.   10.  Status post ACL reconstruction. Femoral interference screw and partially imaged tibial screw are intact without loosening.   11.  Osteopenia.   12.  Moderate degenerative changes of the knee.   13.  Mild degenerative changes of the hips and pubic symphysis.   14.  Degenerative changes of the partially imaged lumbosacral spine.   15.  Partially imaged mesh tacks in the abdomen (along the ventral abdominal wall when correlating with prior CT abdomen/pelvis dated 11/16/2019).     PATIENT SURVEYS:  FOTO 33%, predicted outcome 43%  COGNITION:  Overall cognitive status: Within functional limits for tasks assessed     SENSATION: Not tested  MUSCLE LENGTH: Hamstrings: Right 90 deg; Left 90 deg  POSTURE: weight shift left  PALPATION: Tenderness L lower back, hypomobility lumbar spine.   LUMBAR ROM:   Active  AROM  eval AROM 01/12/22  Flexion To feet, increased pain with return To feet, inc pain coming up  Extension Limited 75%, increased pain Limited 50%  Right lateral flexion To knee, Increased pain L side To knee - increased pain  Left lateral flexion To knee, Increased pain L side To knee  Right rotation No pain 25% limit  Left rotation No pain  25% limit   (Blank rows = not tested)  LOWER EXTREMITY ROM:     Active  Right eval Left eval  Hip flexion    Hip extension    Hip abduction    Hip adduction    Hip internal rotation 20 20  Hip external rotation 80 50  Knee flexion 125 100  Knee extension 0 Lacking 14   Ankle dorsiflexion    Ankle plantarflexion    Ankle inversion  Ankle eversion     (Blank rows = not tested)  LOWER EXTREMITY MMT:    MMT Right * eval Left * eval  Hip flexion 5 5  Hip extension    Hip abduction 5 5  Hip adduction 5 5  Knee flexion 5 4  Knee extension 5 5  Ankle dorsiflexion 5 3  Ankle plantarflexion 5 3  Ankle inversion 5 3  Ankle eversion 5 2   (Blank rows = not tested) *reported increased pain with all resisted movements bil.   LUMBAR SPECIAL TESTS:  Straight leg raise test: Negative  FUNCTIONAL TESTS:  5 times sit to stand: 10.4 seconds  GAIT: Distance walked: 50' Assistive device utilized: Environmental consultant - 4 wheeled Level of assistance: Modified independence Comments: no longer wears AFO, L ankle tends to intoe  TODAY'S TREATMENT  03/01/22 Therapeutic Exercise: to improve strength and mobility.  Demo, verbal and tactile cues throughout for technique. Nustep L6 x 8 min  LTR x 10  Bridges x 10  Straight leg Raise 2 x 10 bil  Supine clams RTB x 20 - LLE stabilizing due to pain Manual Therapy: to decrease muscle spasm and pain and improve mobility   STM/TPR to L hip gluts/glut medius to decrease pain Modalities: MHP applied to back during exercises for muscle relaxation and to decrease pain.    02/22/22 Therapeutic Exercise: to improve strength and mobility.  Demo,  verbal and tactile cues throughout for technique.  Nustep L6 x 8 min Standing bird dog 2x10 bil Standing marches x 10 bil Standing HS curls 2x10 bil Standing hip abduction 2x10 bil Standing LE tap to cones anterior, medial, and lateral 10x bil Knee flexion 25# 2x10 Knee extension 15# 2x10 Gait 360' 4WW  02/15/22 Therapeutic Exercise: to improve strength and mobility.  Demo,  verbal and tactile cues throughout for technique.  Nustep L5 x 8 min  Seated HS Curls #30 2x10  Seated Knee Extension #25 2x10  Neuromuscular Reeducation: to improve balance and stability. SBA/CGA for safety throughout.  Toe Taps on Yoga block w/ Mirror 2x10 bilat-cue for  posture  [employer] w/ Mirror: Fwd/Bwd & S/S: 2x10 bilat-cue for posture using theraband to step over Manual Therapy: to decrease muscle spasm and pain and improve mobility STM/TPR to L tib anterior and peroneals, IASTM to plantar fascia, intrinsics, mobs to forefoot, 1st ray mobilization, gentle stretches.    02/01/22 Therapeutic Exercise: to improve strength and mobility.  Demo, verbal and tactile cues throughout for technique. Nustep L5 x 6 min  L Ankle exercises with BAPS board level 2 - DF/PF x 20, Inv/EVER x 10, CW/CCW x 10 each.   Seated ankle circles.   Manual Therapy: to decrease muscle spasm and pain and improve mobility STM/TPR to L tib anterior and peroneals, IASTM to plantar fascia, intrinsics, mobs to forefoot, 1st ray mobilization, gentle stretches.  Cross friction to peroneals to release peroneal nerve.    PATIENT EDUCATION:  Education details: HEP update 12/20/21 Person educated: Patient Education method: Explanation, Demonstration, Verbal cues, and Handouts Education comprehension: verbalized understanding and returned demonstration   HOME EXERCISE PROGRAM: Access Code: [email or web address] URL: [email or web address] Date: 12/20/2021 Prepared by: Glenetta Hew  Exercises - Long Sitting Calf Stretch with Strap  - 1 x daily - 7 x weekly - 3 sets - 10 reps - Seated Toe Raise  - 1 x daily - 7 x weekly - 3 sets - 10 reps - Ankle Inversion Eversion Towel Slide  - 1 x daily -  7 x weekly - 3 sets - 10 reps - Seated Ankle Eversion with Resistance  - 1 x daily - 7 x weekly - 3 sets - 10 reps - Seated Ankle Inversion Eversion PROM  - 1 x daily - 7 x weekly - 3 sets - 10 reps - Seated Ankle Alphabet  - 1 x daily - 7 x weekly - 3 sets - 10 reps - Supine Posterior Pelvic Tilt  - 1 x daily - 7 x weekly - 2 sets - 10 reps - Supine March with Posterior Pelvic Tilt  - 1 x daily - 7 x weekly - 2 sets - 10 reps - Dead Bug  - 1 x daily - 7 x weekly - 2 sets - 10 reps -  Supine Bridge  - 1 x daily - 7 x weekly - 2 sets - 10 reps - Squat with Chair Touch and Resistance Loop  - 1 x daily - 7 x weekly - 3 sets - 10 reps - Ankle Circles on Wobble Board in Sitting  - 1 x daily - 7 x weekly - 3 sets - 10 reps  ASSESSMENT:  CLINICAL IMPRESSION: Andrew Banks reported increased pain today due to weather changes, so focused on core/Hip strengthening while supine on MHP to decrease pain, followed by manual therapy to L gluts as very tight and having difficulty with all L hip abduction due to increased pain.  Reported decreased pain after interventions.  Andrew Banks continues to demonstrate potential for improvement and would benefit from continued skilled therapy to address impairments.     OBJECTIVE IMPAIRMENTS Abnormal gait, decreased activity tolerance, decreased balance, decreased endurance, decreased mobility, difficulty walking, decreased ROM, decreased strength, hypomobility, increased fascial restrictions, increased muscle spasms, impaired flexibility, postural dysfunction, and pain.   ACTIVITY LIMITATIONS carrying, lifting, bending, sitting, standing, sleeping, stairs, transfers, dressing, and locomotion level  PARTICIPATION LIMITATIONS: meal prep, cleaning, laundry, driving, shopping, community activity, yard work, and travel  [place] Age, Past/current experiences, Time since onset of injury/illness/exacerbation, and 3+ comorbidities: COPD, chronic LBP, liver transplant, skin cancer, HTN, osteoporosis, history falls with injury, and surgical hardware  are also affecting patient's functional outcome.   REHAB POTENTIAL: Good  CLINICAL DECISION MAKING: Evolving/moderate complexity  EVALUATION COMPLEXITY: Moderate   GOALS: Goals reviewed with patient? Yes  SHORT TERM GOALS: Target date: 11/01/2021   Patient will be independent with initial HEP.  Baseline:  HEP given 10/25/21 Goal status: MET  good compliance   LONG TERM GOALS: Target date:  01/03/2022  extended to 03/15/22  Patient will be independent with advanced/ongoing HEP to improve outcomes and carryover.  Baseline:  Goal status: IN PROGRESS 01/18/22- met for current  2.  Patient will report 50% improvement in low back pain to improve QOL.  Baseline:  Goal status: IN PROGRESS- 30% improvement 01/18/22  3.  Patient will demonstrate full pain free lumbar ROM to perform ADLs.   Baseline: increased pain with extension and SB Goal status: IN PROGRESS  4.  Patient will demonstrate improved functional strength and balance as demonstrated by DGI >19/24 to decrease risk of falls.  Baseline: 18/24 on 08/26/21 Goal status: IN PROGRESS  5.  Patient will report 43% on lumbar FOTO to demonstrate improved functional ability.  Baseline: 33% Goal status: IN PROGRESS   6.  Patient will tolerate 15 min of  walking to access community.  Baseline: increased pain after 5-10 min Goal status: IN PROGRESS- 15 min per pt 01/12/22  7.  Patient  will be able to tolerate sitting >1 hour without increased LLE pain or swelling in order to travel.  Baseline: 30 min Goal status: MET 01/18/22    PLAN: PT FREQUENCY: 1-2x/week  PT DURATION: 8 weeks   PLANNED INTERVENTIONS: Therapeutic exercises, Therapeutic activity, Neuromuscular re-education, Balance training, Gait training, Patient/Family education, Self Care, Joint mobilization, Stair training, Orthotic/Fit training, DME instructions, Aquatic Therapy, Dry Needling, Electrical stimulation, Spinal mobilization, Cryotherapy, Moist heat, Manual therapy, and Re-evaluation.  PLAN FOR NEXT SESSION: continue to progress with LE/hip/core strengthening and incorporate more balance training.    Rennie Natter, PT, DPT  03/01/2022, 2:14 PM

## 2022-03-15 ENCOUNTER — Ambulatory Visit: Payer: Medicare PPO | Attending: Emergency Medicine | Admitting: Physical Therapy

## 2022-03-15 ENCOUNTER — Encounter: Payer: Self-pay | Admitting: Physical Therapy

## 2022-03-15 DIAGNOSIS — R2689 Other abnormalities of gait and mobility: Secondary | ICD-10-CM

## 2022-03-15 DIAGNOSIS — M6281 Muscle weakness (generalized): Secondary | ICD-10-CM

## 2022-03-15 DIAGNOSIS — M25562 Pain in left knee: Secondary | ICD-10-CM | POA: Diagnosis present

## 2022-03-15 DIAGNOSIS — R262 Difficulty in walking, not elsewhere classified: Secondary | ICD-10-CM | POA: Diagnosis present

## 2022-03-15 DIAGNOSIS — G8929 Other chronic pain: Secondary | ICD-10-CM

## 2022-03-15 DIAGNOSIS — M5442 Lumbago with sciatica, left side: Secondary | ICD-10-CM | POA: Diagnosis not present

## 2022-03-15 DIAGNOSIS — R2681 Unsteadiness on feet: Secondary | ICD-10-CM

## 2022-03-15 NOTE — Therapy (Signed)
OUTPATIENT PHYSICAL THERAPY TREATMENT Progress Note Reporting Period 01/03/22 to 03/15/22  See note below for Objective Data and Assessment of Progress/Goals.     Patient Name: Andrew Banks MRN: [id number] DOB:Oct 04, 1950, 72 y.o., male Today's Date: 03/15/2022   PT End of Session - 03/15/22 1311     Visit Number 14    Number of Visits 15    Date for PT Re-Evaluation 05/24/22    Authorization Type [facility], [place] approved 22 to 03/15/22    Authorization - Number of Visits 15    Progress Note Due on Visit 20    PT Start Time 1316    PT Stop Time 1408    PT Time Calculation (min) 52 min    Activity Tolerance Patient tolerated treatment well    Behavior During Therapy WFL for tasks assessed/performed                  Past Medical History:  Diagnosis Date   Arthritis    Degenerative disc disease, lumbar    Hypertension    Liver transplanted [facility])    Skin cancer    Past Surgical History:  Procedure Laterality Date   liver transplant     There are no problems to display for this patient.   PCP: Felipa Eth, MD  REFERRING PROVIDER: [facility]  REFERRING DIAG: M54.50 (ICD-10-CM) - Low back pain, unspecified  Rationale for Evaluation and Treatment Rehabilitation  THERAPY DIAG:  Chronic left-sided low back pain with left-sided sciatica  Muscle weakness (generalized)  Unsteadiness on feet  Difficulty in walking, not elsewhere classified  Chronic pain of left knee  Other abnormalities of gait and mobility  ONSET DATE: chronic  SUBJECTIVE:                                                                                                                                                                                           SUBJECTIVE STATEMENT:  Reports back pain has improved 75% overall, now able to lay on back and sleep better, not having cramping pain, and has better ROM.     PERTINENT HISTORY:  History skin cancer,  liver transplant, s/p ORIF Pelvic fx 11/12/19, s/p IMN L distal femur 08/12/2019, s/p L IM nailing femur  11/02/20, L foot drop, lumbar DDD,   PAIN:  Are you having pain? Yes: NPRS scale: 7/10 Pain location: L side low back, hip, pelvis, 7/10 L knee, L foot 7/10  Pain description: constant ache low back/hip; sharp pain stabbing in lower leg,  Aggravating factors: standing/walking long periods >45 min  Relieving factors: sitting down   PRECAUTIONS:  Fall  WEIGHT BEARING RESTRICTIONS No  FALLS:  Has patient fallen in last 6 months? No  LIVING ENVIRONMENT: Lives with: lives with their spouse Lives in: House/apartment Stairs: Yes: External: 17 steps; on right going up, on left going up, and can reach both Has following equipment at home: Single point cane, Walker - 4 wheeled, Wheelchair (manual), Electronics engineer, and Grab bars  OCCUPATION: retired  PLOF: Independent with household mobility with device  PATIENT GOALS lesson pain, improve gait, sit to be able to travel longer distances   OBJECTIVE:   DIAGNOSTIC FINDINGS:  XR 12/22/20: 1.  Healing unstable traumatic U-shaped sacral fracture (spinopelvic dissociation) status post internal fixation with bilateral iliosacral screws at S1 and A left transsacral transiliac screw at S2. Both hips are located. No hardware complication. No gross change in alignment.   2.  Healed left ischiopubic ramus fracture.   3.  Healing peri-implant pertrochanteric left femoral fracture status post cephalomedullary nail fixation. No hardware is intact without loosening. No change in fracture alignment.   4.  Healed bicondylar distal femoral fracture (extending proximally to the distal third diaphysis) status post ORIF with lateral buttress plate and screws. No hardware complication or change in fracture alignment.   5.  Medial patellar fracture is is not visible on this examination and seen on the CT left knee dated 11/11/2019.   6.  Healed nondisplaced fibular neck  fracture.   7.  No new fractures.   8.  No joint misalignment (both sacroiliac, pubic symphysis, and both hips, and left knee).   9.  Status post embolization of multiple arteries (left L3 and L4 lumbar, right L4 lumbar, 2 branches of the left deep circumflex iliac artery, and bilateral iliolumbar arteries (per Interventional Radiology procedure note dated 11/10/2020) with multiple embolization coils in and around the anatomic pelvis. Vascular plug in the right groin.   10.  Status post ACL reconstruction. Femoral interference screw and partially imaged tibial screw are intact without loosening.   11.  Osteopenia.   12.  Moderate degenerative changes of the knee.   13.  Mild degenerative changes of the hips and pubic symphysis.   14.  Degenerative changes of the partially imaged lumbosacral spine.   15.  Partially imaged mesh tacks in the abdomen (along the ventral abdominal wall when correlating with prior CT abdomen/pelvis dated 11/16/2019).     PATIENT SURVEYS:  FOTO 33%, predicted outcome 43%  COGNITION:  Overall cognitive status: Within functional limits for tasks assessed     SENSATION: Not tested  MUSCLE LENGTH: Hamstrings: Right 90 deg; Left 90 deg  POSTURE: weight shift left  PALPATION: Tenderness L lower back, hypomobility lumbar spine.   LUMBAR ROM:   Active  AROM  eval AROM 01/12/22 AROM  03/15/2022  Flexion To feet, increased pain with return To feet, inc pain coming up To feet, pain with return   Extension Limited 75%, increased pain Limited 50% Limited 50%  Right lateral flexion To knee, Increased pain L side To knee - increased pain No pain  Left lateral flexion To knee, Increased pain L side To knee No pain  Right rotation No pain 25% limit No pain  Left rotation No pain  25% limit No pain   (Blank rows = not tested)  LOWER EXTREMITY ROM:     Active  Right eval Left eval  Hip flexion    Hip extension    Hip abduction    Hip adduction    Hip internal  rotation 20 20  Hip external rotation 80 50  Knee flexion 125 100  Knee extension 0 Lacking 14   Ankle dorsiflexion    Ankle plantarflexion    Ankle inversion    Ankle eversion     (Blank rows = not tested)  LOWER EXTREMITY MMT:    MMT Right * eval Left * eval  Hip flexion 5 5  Hip extension    Hip abduction 5 5  Hip adduction 5 5  Knee flexion 5 4  Knee extension 5 5  Ankle dorsiflexion 5 3  Ankle plantarflexion 5 3  Ankle inversion 5 3  Ankle eversion 5 2   (Blank rows = not tested) *reported increased pain with all resisted movements bil.   LUMBAR SPECIAL TESTS:  Straight leg raise test: Negative  FUNCTIONAL TESTS:  5 times sit to stand: 10.4 seconds  GAIT: Distance walked: 50' Assistive device utilized: Environmental consultant - 4 wheeled Level of assistance: Modified independence Comments: no longer wears AFO, L ankle tends to intoe   [facility] PT Assessment - 03/15/22 0001       Dynamic Gait Index   Level Surface Mild Impairment    Change in Gait Speed Mild Impairment    Gait with Horizontal Head Turns Normal    Gait with Vertical Head Turns Normal    Gait and Pivot Turn Normal    Step Over Obstacle Mild Impairment    Step Around Obstacles Normal    Steps Moderate Impairment    Total Score 19             TODAY'S TREATMENT  03/15/22 Therapeutic Exercise: to improve strength and mobility.  Demo, verbal and tactile cues throughout for technique. Nustep L6 x 6 min Seated flexion to decreased LBP Therapeutic Activity:  to assess progress towards goals and concerns.  FOTO, DGI, lumbar AROM Manual Therapy: to decrease muscle spasm and pain and improve mobility.  STM/TPR to L peroneals, skilled palpation and monitoring during dry needling. Trigger Point Dry-Needling  Treatment instructions: Expect mild to moderate muscle soreness. S/S of pneumothorax if dry needled over a lung field, and to seek immediate medical attention should they occur. Patient verbalized understanding  of these instructions and education. Patient Consent Given: Yes Education handout provided: Previously provided Muscles treated: L peroneals Treatment response/outcome: Twitch Response Elicited and Palpable Increase in Muscle Length   03/01/22 Therapeutic Exercise: to improve strength and mobility.  Demo, verbal and tactile cues throughout for technique. Nustep L6 x 8 min  LTR x 10  Bridges x 10  Straight leg Raise 2 x 10 bil  Supine clams RTB x 20 - LLE stabilizing due to pain Manual Therapy: to decrease muscle spasm and pain and improve mobility   STM/TPR to L hip gluts/glut medius to decrease pain Modalities: MHP applied to back during exercises for muscle relaxation and to decrease pain.    02/22/22 Therapeutic Exercise: to improve strength and mobility.  Demo,  verbal and tactile cues throughout for technique.  Nustep L6 x 8 min Standing bird dog 2x10 bil Standing marches x 10 bil Standing HS curls 2x10 bil Standing hip abduction 2x10 bil Standing LE tap to cones anterior, medial, and lateral 10x bil Knee flexion 25# 2x10 Knee extension 15# 2x10 Gait 360' 4WW  PATIENT EDUCATION:  Education details: HEP update 12/20/21 Person educated: Patient Education method: Consulting civil engineer, Demonstration, Verbal cues, and Handouts Education comprehension: verbalized understanding and returned demonstration   HOME EXERCISE PROGRAM: Access Code: [email or web address] URL: [email or web address] Date:  12/20/2021 Prepared by: [place] Sitting Calf Stretch with Strap  - 1 x daily - 7 x weekly - 3 sets - 10 reps - Seated Toe Raise  - 1 x daily - 7 x weekly - 3 sets - 10 reps - Ankle Inversion Eversion Towel Slide  - 1 x daily - 7 x weekly - 3 sets - 10 reps - Seated Ankle Eversion with Resistance  - 1 x daily - 7 x weekly - 3 sets - 10 reps - Seated Ankle Inversion Eversion PROM  - 1 x daily - 7 x weekly - 3 sets - 10 reps - Seated Ankle Alphabet  - 1 x daily -  7 x weekly - 3 sets - 10 reps - Supine Posterior Pelvic Tilt  - 1 x daily - 7 x weekly - 2 sets - 10 reps - Supine March with Posterior Pelvic Tilt  - 1 x daily - 7 x weekly - 2 sets - 10 reps - Dead Bug  - 1 x daily - 7 x weekly - 2 sets - 10 reps - Supine Bridge  - 1 x daily - 7 x weekly - 2 sets - 10 reps - Squat with Chair Touch and Resistance Loop  - 1 x daily - 7 x weekly - 3 sets - 10 reps - Ankle Circles on Wobble Board in Sitting  - 1 x daily - 7 x weekly - 3 sets - 10 reps  ASSESSMENT:  CLINICAL IMPRESSION: Briscoe Carbin is making good progress towards goals.  He reports 75% improvement in low back pain, and improved sleep which has improved his QOL significantly.  His FOTO has improved to 40% (expected outcome only 43%).  He still has pain at end range and with prolonged standing and walking, and reported today constant L foot pain.  We discussed talking to orthopedist about testing for his L foot to see source of peripheral neuropathy - suspect peroneal nerve injury from hardware placement when fractured L femur.  He continues to demonstrate improving strength, we had trialed NMES to muscles before but insurance would not cover unit unless he had NCV testing to demonstrate innervation of those muscles.  Today performed TrDN to L peroneals, noted heightened twitch response and immediate improvement in L foot pain, and able to place foot more neutral on ground following (normally foot is inverted).  Alcario Drought would benefit from continued skilled therapy to continue working on LE and core strengthening, L foot/ankle strengthening, in order to improve gait and balance as well as pain to decrease risk of falls and improve independence.  Recommend continuing skilled therapy 1x/week for additional 10 weeks.     OBJECTIVE IMPAIRMENTS Abnormal gait, decreased activity tolerance, decreased balance, decreased endurance, decreased mobility, difficulty walking, decreased ROM, decreased strength,  hypomobility, increased fascial restrictions, increased muscle spasms, impaired flexibility, postural dysfunction, and pain.   ACTIVITY LIMITATIONS carrying, lifting, bending, sitting, standing, sleeping, stairs, transfers, dressing, and locomotion level  PARTICIPATION LIMITATIONS: meal prep, cleaning, laundry, driving, shopping, community activity, yard work, and travel  [place] Age, Past/current experiences, Time since onset of injury/illness/exacerbation, and 3+ comorbidities: COPD, chronic LBP, liver transplant, skin cancer, HTN, osteoporosis, history falls with injury, and surgical hardware  are also affecting patient's functional outcome.   REHAB POTENTIAL: Good  CLINICAL DECISION MAKING: Evolving/moderate complexity  EVALUATION COMPLEXITY: Moderate   GOALS: Goals reviewed with patient? Yes  SHORT TERM GOALS: Target date: 11/01/2021   Patient will be  independent with initial HEP.  Baseline:  HEP given 10/25/21 Goal status: MET  good compliance   LONG TERM GOALS: Target date: 05/24/2022  Patient will be independent with advanced/ongoing HEP to improve outcomes and carryover.  Baseline:  Goal status: IN PROGRESS 03/15/22- met for current  2.  Patient will report 50% improvement in low back pain to improve QOL.  Baseline:  Goal status: MET- 30% improvement 01/18/22; 03/15/22- 75% improvement. Now able to lay down and sleep on back.   3.  Patient will demonstrate full pain free lumbar ROM to perform ADLs.   Baseline: increased pain with extension and SB Goal status: IN PROGRESS 03/15/22- improving, but still has pain with extension, if flexes too far has difficulty returning to neutral.    4.  Patient will demonstrate improved functional strength and balance as demonstrated by DGI = 19/24 to decrease risk of falls.  Baseline: 18/24 on 08/26/21 Goal status: REVISED and MET 03/15/22- 19/24.    5.  Patient will report 43% on lumbar FOTO to demonstrate improved functional  ability.  Baseline: 33% Goal status: IN PROGRESS 03/15/22- 40%  6.  Patient will tolerate 15 min of  walking to access community.  Baseline: increased pain after 5-10 min Goal status: IN PROGRESS- 03/15/22 - still limited to ~ 5-6 minutes with 4WRW, today with SPC needed break after 150'.   7.  Patient will be able to tolerate sitting >1 hour without increased LLE pain or swelling in order to travel.  Baseline: 30 min Goal status: MET 01/18/22     PLAN: PT FREQUENCY: 1-2x/week  PT DURATION: 10 weeks   PLANNED INTERVENTIONS: Therapeutic exercises, Therapeutic activity, Neuromuscular re-education, Balance training, Gait training, Patient/Family education, Self Care, Joint mobilization, Stair training, Orthotic/Fit training, DME instructions, Aquatic Therapy, Dry Needling, Electrical stimulation, Spinal mobilization, Cryotherapy, Moist heat, Manual therapy, and Re-evaluation.  PLAN FOR NEXT SESSION: continue to progress with LE/hip/core strengthening and incorporate more balance training.    Rennie Natter, PT, DPT  03/15/2022, 2:40 PM

## 2022-03-22 ENCOUNTER — Ambulatory Visit: Payer: Medicare PPO

## 2022-03-22 DIAGNOSIS — R262 Difficulty in walking, not elsewhere classified: Secondary | ICD-10-CM

## 2022-03-22 DIAGNOSIS — R2689 Other abnormalities of gait and mobility: Secondary | ICD-10-CM

## 2022-03-22 DIAGNOSIS — G8929 Other chronic pain: Secondary | ICD-10-CM

## 2022-03-22 DIAGNOSIS — R2681 Unsteadiness on feet: Secondary | ICD-10-CM

## 2022-03-22 DIAGNOSIS — M6281 Muscle weakness (generalized): Secondary | ICD-10-CM

## 2022-03-22 DIAGNOSIS — M5442 Lumbago with sciatica, left side: Secondary | ICD-10-CM | POA: Diagnosis not present

## 2022-03-22 NOTE — Therapy (Signed)
OUTPATIENT PHYSICAL THERAPY TREATMENT     Patient Name: Andrew Banks MRN: [id number] DOB:1950/06/05, 72 y.o., male Today's Date: 03/22/2022   PT End of Session - 03/22/22 1401     Visit Number 15    Number of Visits --    Date for PT Re-Evaluation 05/24/22    Authorization Type [facility], [place] approved 22 to 03/15/22    Authorization - Visit Number 1    Authorization - Number of Visits 10    Progress Note Due on Visit 20    PT Start Time 1316    PT Stop Time 1400    PT Time Calculation (min) 44 min    Activity Tolerance Patient tolerated treatment well    Behavior During Therapy WFL for tasks assessed/performed                   Past Medical History:  Diagnosis Date   Arthritis    Degenerative disc disease, lumbar    Hypertension    Liver transplanted [facility])    Skin cancer    Past Surgical History:  Procedure Laterality Date   liver transplant     There are no problems to display for this patient.   PCP: Felipa Eth, MD  REFERRING PROVIDER: [facility]  REFERRING DIAG: M54.50 (ICD-10-CM) - Low back pain, unspecified  Rationale for Evaluation and Treatment Rehabilitation  THERAPY DIAG:  Chronic left-sided low back pain with left-sided sciatica  Muscle weakness (generalized)  Unsteadiness on feet  Difficulty in walking, not elsewhere classified  Chronic pain of left knee  Other abnormalities of gait and mobility  ONSET DATE: chronic  SUBJECTIVE:                                                                                                                                                                                           SUBJECTIVE STATEMENT:  Pt reports this last week he was feeling more weak and tired     PERTINENT HISTORY:  History skin cancer, liver transplant, s/p ORIF Pelvic fx 11/12/19, s/p IMN L distal femur 08/12/2019, s/p L IM nailing femur  11/02/20, L foot drop, lumbar DDD,   PAIN:  Are you  having pain? Yes: NPRS scale: 6/10 Pain location: L side low back, hip, pelvis, 6/10 L knee, L foot 6/10  Pain description: constant ache low back/hip; sharp pain stabbing in lower leg,  Aggravating factors: standing/walking long periods >45 min  Relieving factors: sitting down   PRECAUTIONS: Fall  WEIGHT BEARING RESTRICTIONS No  FALLS:  Has patient fallen in last 6 months? No  LIVING ENVIRONMENT: Lives  with: lives with their spouse Lives in: House/apartment Stairs: Yes: External: 17 steps; on right going up, on left going up, and can reach both Has following equipment at home: Single point cane, Walker - 4 wheeled, Wheelchair (manual), Electronics engineer, and Grab bars  OCCUPATION: retired  PLOF: Independent with household mobility with device  PATIENT GOALS lesson pain, improve gait, sit to be able to travel longer distances   OBJECTIVE:   DIAGNOSTIC FINDINGS:  XR 12/22/20: 1.  Healing unstable traumatic U-shaped sacral fracture (spinopelvic dissociation) status post internal fixation with bilateral iliosacral screws at S1 and A left transsacral transiliac screw at S2. Both hips are located. No hardware complication. No gross change in alignment.   2.  Healed left ischiopubic ramus fracture.   3.  Healing peri-implant pertrochanteric left femoral fracture status post cephalomedullary nail fixation. No hardware is intact without loosening. No change in fracture alignment.   4.  Healed bicondylar distal femoral fracture (extending proximally to the distal third diaphysis) status post ORIF with lateral buttress plate and screws. No hardware complication or change in fracture alignment.   5.  Medial patellar fracture is is not visible on this examination and seen on the CT left knee dated 11/11/2019.   6.  Healed nondisplaced fibular neck fracture.   7.  No new fractures.   8.  No joint misalignment (both sacroiliac, pubic symphysis, and both hips, and left knee).   9.  Status post embolization  of multiple arteries (left L3 and L4 lumbar, right L4 lumbar, 2 branches of the left deep circumflex iliac artery, and bilateral iliolumbar arteries (per Interventional Radiology procedure note dated 11/10/2020) with multiple embolization coils in and around the anatomic pelvis. Vascular plug in the right groin.   10.  Status post ACL reconstruction. Femoral interference screw and partially imaged tibial screw are intact without loosening.   11.  Osteopenia.   12.  Moderate degenerative changes of the knee.   13.  Mild degenerative changes of the hips and pubic symphysis.   14.  Degenerative changes of the partially imaged lumbosacral spine.   15.  Partially imaged mesh tacks in the abdomen (along the ventral abdominal wall when correlating with prior CT abdomen/pelvis dated 11/16/2019).     PATIENT SURVEYS:  FOTO 33%, predicted outcome 43%  COGNITION:  Overall cognitive status: Within functional limits for tasks assessed     SENSATION: Not tested  MUSCLE LENGTH: Hamstrings: Right 90 deg; Left 90 deg  POSTURE: weight shift left  PALPATION: Tenderness L lower back, hypomobility lumbar spine.   LUMBAR ROM:   Active  AROM  eval AROM 01/12/22 AROM  03/15/2022  Flexion To feet, increased pain with return To feet, inc pain coming up To feet, pain with return   Extension Limited 75%, increased pain Limited 50% Limited 50%  Right lateral flexion To knee, Increased pain L side To knee - increased pain No pain  Left lateral flexion To knee, Increased pain L side To knee No pain  Right rotation No pain 25% limit No pain  Left rotation No pain  25% limit No pain   (Blank rows = not tested)  LOWER EXTREMITY ROM:     Active  Right eval Left eval  Hip flexion    Hip extension    Hip abduction    Hip adduction    Hip internal rotation 20 20  Hip external rotation 80 50  Knee flexion 125 100  Knee extension 0 Lacking 14  Ankle dorsiflexion    Ankle plantarflexion    Ankle inversion     Ankle eversion     (Blank rows = not tested)  LOWER EXTREMITY MMT:    MMT Right * eval Left * eval  Hip flexion 5 5  Hip extension    Hip abduction 5 5  Hip adduction 5 5  Knee flexion 5 4  Knee extension 5 5  Ankle dorsiflexion 5 3  Ankle plantarflexion 5 3  Ankle inversion 5 3  Ankle eversion 5 2   (Blank rows = not tested) *reported increased pain with all resisted movements bil.   LUMBAR SPECIAL TESTS:  Straight leg raise test: Negative  FUNCTIONAL TESTS:  5 times sit to stand: 10.4 seconds  GAIT: Distance walked: 50' Assistive device utilized: Environmental consultant - 4 wheeled Level of assistance: Modified independence Comments: no longer wears AFO, L ankle tends to intoe     TODAY'S TREATMENT  03/22/22 Therapeutic Exercise: to improve strength and mobility.  Demo, verbal and tactile cues throughout for technique. Nustep L6 x 6 min Ankle circles x 10 Toe spreads x 10  Clock balance 1/2 circle bil 3x with SPC Fwd step with WS and arm raise x 10 bil Lateral step with arm raise x 10bil Manual Therapy: to decrease muscle spasm, pain and improve mobility.  STM to L plantar fascia, peroneals Toe PROM spreading and extension   03/15/22 Therapeutic Exercise: to improve strength and mobility.  Demo, verbal and tactile cues throughout for technique. Nustep L6 x 6 min Seated flexion to decreased LBP Therapeutic Activity:  to assess progress towards goals and concerns.  FOTO, DGI, lumbar AROM Manual Therapy: to decrease muscle spasm and pain and improve mobility.  STM/TPR to L peroneals, skilled palpation and monitoring during dry needling. Trigger Point Dry-Needling  Treatment instructions: Expect mild to moderate muscle soreness. S/S of pneumothorax if dry needled over a lung field, and to seek immediate medical attention should they occur. Patient verbalized understanding of these instructions and education. Patient Consent Given: Yes Education handout provided: Previously  provided Muscles treated: L peroneals Treatment response/outcome: Twitch Response Elicited and Palpable Increase in Muscle Length   03/01/22 Therapeutic Exercise: to improve strength and mobility.  Demo, verbal and tactile cues throughout for technique. Nustep L6 x 8 min  LTR x 10  Bridges x 10  Straight leg Raise 2 x 10 bil  Supine clams RTB x 20 - LLE stabilizing due to pain Manual Therapy: to decrease muscle spasm and pain and improve mobility   STM/TPR to L hip gluts/glut medius to decrease pain Modalities: MHP applied to back during exercises for muscle relaxation and to decrease pain.    02/22/22 Therapeutic Exercise: to improve strength and mobility.  Demo,  verbal and tactile cues throughout for technique.  Nustep L6 x 8 min Standing bird dog 2x10 bil Standing marches x 10 bil Standing HS curls 2x10 bil Standing hip abduction 2x10 bil Standing LE tap to cones anterior, medial, and lateral 10x bil Knee flexion 25# 2x10 Knee extension 15# 2x10 Gait 360' 4WW  PATIENT EDUCATION:  Education details: HEP update 12/20/21 Person educated: Patient Education method: Consulting civil engineer, Demonstration, Verbal cues, and Handouts Education comprehension: verbalized understanding and returned demonstration   HOME EXERCISE PROGRAM: Access Code: [email or web address] URL: [email or web address] Date: 12/20/2021 Prepared by: Glenetta Hew  Exercises - Long Sitting Calf Stretch with Strap  - 1 x daily - 7 x weekly - 3 sets - 10 reps - Seated Toe Raise  -  1 x daily - 7 x weekly - 3 sets - 10 reps - Ankle Inversion Eversion Towel Slide  - 1 x daily - 7 x weekly - 3 sets - 10 reps - Seated Ankle Eversion with Resistance  - 1 x daily - 7 x weekly - 3 sets - 10 reps - Seated Ankle Inversion Eversion PROM  - 1 x daily - 7 x weekly - 3 sets - 10 reps - Seated Ankle Alphabet  - 1 x daily - 7 x weekly - 3 sets - 10 reps - Supine Posterior Pelvic Tilt  - 1 x daily - 7 x weekly - 2 sets - 10  reps - Supine March with Posterior Pelvic Tilt  - 1 x daily - 7 x weekly - 2 sets - 10 reps - Dead Bug  - 1 x daily - 7 x weekly - 2 sets - 10 reps - Supine Bridge  - 1 x daily - 7 x weekly - 2 sets - 10 reps - Squat with Chair Touch and Resistance Loop  - 1 x daily - 7 x weekly - 3 sets - 10 reps - Ankle Circles on Wobble Board in Sitting  - 1 x daily - 7 x weekly - 3 sets - 10 reps  ASSESSMENT:  CLINICAL IMPRESSION: Continued with progressing exercises to tolerance. Pt still has tightness and difficulty with extending toes and everting L foot. CGA was needed with balance activities. Pt showed some difficulty with fwd stepping and back, noting fatigue towards the end of exercise.    OBJECTIVE IMPAIRMENTS Abnormal gait, decreased activity tolerance, decreased balance, decreased endurance, decreased mobility, difficulty walking, decreased ROM, decreased strength, hypomobility, increased fascial restrictions, increased muscle spasms, impaired flexibility, postural dysfunction, and pain.   ACTIVITY LIMITATIONS carrying, lifting, bending, sitting, standing, sleeping, stairs, transfers, dressing, and locomotion level  PARTICIPATION LIMITATIONS: meal prep, cleaning, laundry, driving, shopping, community activity, yard work, and travel  [place] Age, Past/current experiences, Time since onset of injury/illness/exacerbation, and 3+ comorbidities: COPD, chronic LBP, liver transplant, skin cancer, HTN, osteoporosis, history falls with injury, and surgical hardware  are also affecting patient's functional outcome.   REHAB POTENTIAL: Good  CLINICAL DECISION MAKING: Evolving/moderate complexity  EVALUATION COMPLEXITY: Moderate   GOALS: Goals reviewed with patient? Yes  SHORT TERM GOALS: Target date: 11/01/2021   Patient will be independent with initial HEP.  Baseline:  HEP given 10/25/21 Goal status: MET  good compliance   LONG TERM GOALS: Target date: 05/24/2022  Patient will be  independent with advanced/ongoing HEP to improve outcomes and carryover.  Baseline:  Goal status: IN PROGRESS 03/15/22- met for current  2.  Patient will report 50% improvement in low back pain to improve QOL.  Baseline:  Goal status: MET- 30% improvement 01/18/22; 03/15/22- 75% improvement. Now able to lay down and sleep on back.   3.  Patient will demonstrate full pain free lumbar ROM to perform ADLs.   Baseline: increased pain with extension and SB Goal status: IN PROGRESS 03/15/22- improving, but still has pain with extension, if flexes too far has difficulty returning to neutral.    4.  Patient will demonstrate improved functional strength and balance as demonstrated by DGI = 19/24 to decrease risk of falls.  Baseline: 18/24 on 08/26/21 Goal status: REVISED and MET 03/15/22- 19/24.    5.  Patient will report 43% on lumbar FOTO to demonstrate improved functional ability.  Baseline: 33% Goal status: IN PROGRESS 03/15/22- 40%  6.  Patient  will tolerate 15 min of  walking to access community.  Baseline: increased pain after 5-10 min Goal status: IN PROGRESS- 03/15/22 - still limited to ~ 5-6 minutes with 4WRW, today with SPC needed break after 150'.   7.  Patient will be able to tolerate sitting >1 hour without increased LLE pain or swelling in order to travel.  Baseline: 30 min Goal status: MET 01/18/22     PLAN: PT FREQUENCY: 1-2x/week  PT DURATION: 10 weeks   PLANNED INTERVENTIONS: Therapeutic exercises, Therapeutic activity, Neuromuscular re-education, Balance training, Gait training, Patient/Family education, Self Care, Joint mobilization, Stair training, Orthotic/Fit training, DME instructions, Aquatic Therapy, Dry Needling, Electrical stimulation, Spinal mobilization, Cryotherapy, Moist heat, Manual therapy, and Re-evaluation.  PLAN FOR NEXT SESSION: continue to progress with LE/hip/core strengthening and incorporate more balance training.    Artist Pais, PTA 03/22/2022,  2:29 PM

## 2022-03-29 ENCOUNTER — Encounter: Payer: Self-pay | Admitting: Physical Therapy

## 2022-03-29 ENCOUNTER — Ambulatory Visit: Payer: Medicare PPO | Admitting: Physical Therapy

## 2022-03-29 DIAGNOSIS — R262 Difficulty in walking, not elsewhere classified: Secondary | ICD-10-CM

## 2022-03-29 DIAGNOSIS — M6281 Muscle weakness (generalized): Secondary | ICD-10-CM

## 2022-03-29 DIAGNOSIS — G8929 Other chronic pain: Secondary | ICD-10-CM

## 2022-03-29 DIAGNOSIS — R2681 Unsteadiness on feet: Secondary | ICD-10-CM

## 2022-03-29 DIAGNOSIS — R2689 Other abnormalities of gait and mobility: Secondary | ICD-10-CM

## 2022-03-29 DIAGNOSIS — M5442 Lumbago with sciatica, left side: Secondary | ICD-10-CM | POA: Diagnosis not present

## 2022-03-29 NOTE — Therapy (Signed)
OUTPATIENT PHYSICAL THERAPY TREATMENT     Patient Name: Andrew Banks MRN: [id number] DOB:March 14, 1950, 72 y.o., male Today's Date: 03/29/2022   PT End of Session - 03/29/22 1313     Visit Number 16    Number of Visits 32    Date for PT Re-Evaluation 05/24/22    Authorization Type [facility], [employer]    Authorization Time Period [employer] approved 10 additional visits for total of 32 to 05/25/22    Authorization - Visit Number 2    Authorization - Number of Visits 10    Progress Note Due on Visit 20    PT Start Time 1314    PT Stop Time 1400    PT Time Calculation (min) 46 min    Activity Tolerance Patient tolerated treatment well    Behavior During Therapy WFL for tasks assessed/performed                   Past Medical History:  Diagnosis Date   Arthritis    Degenerative disc disease, lumbar    Hypertension    Liver transplanted [facility])    Skin cancer    Past Surgical History:  Procedure Laterality Date   liver transplant     There are no problems to display for this patient.   PCP: Andrew Eth, MD  [facility]ERR[facility] PROVIDER: [facility]  REFERRING DIAG: M54.50 (ICD-10-CM) - Low back pain, unspecified  Rationale for Evaluation and Treatment Rehabilitation  THERAPY DIAG:  Chronic left-sided low back pain with left-sided sciatica  Muscle weakness (generalized)  Unsteadiness on feet  Difficulty in walking, not elsewhere classified  Chronic pain of left knee  Other abnormalities of gait and mobility  ONSET DATE: chronic  SUBJECTIVE:                                                                                                                                                                                           SUBJECTIVE STATEMENT:  Pt reports pain is not as bad this week, not as cold out.    PERTINENT HISTORY:  History skin cancer, liver transplant, s/p ORIF Pelvic fx 11/12/19, s/p IMN L distal femur 08/12/2019, s/p L IM nailing femur  11/02/20, L foot drop,  lumbar DDD,   PAIN:  Are you having pain? Yes: NPRS scale: 5/10 Pain location: L side low back, hip, pelvis, L knee, L foot  Pain description: constant ache low back/hip; sharp pain stabbing in lower leg,  Aggravating factors: standing/walking long periods >45 min  Relieving factors: sitting down   PRECAUTIONS: Fall  WEIGHT BEARING RESTRICTIONS No  FALLS:  Has patient fallen in last 6 months? No  LIVING ENVIRONMENT: Lives with: lives with their spouse Lives in: House/apartment Stairs: Yes: External: 17 steps; on right going up, on left going up, and can reach both Has following equipment at home: Single point cane, Walker - 4 wheeled, Wheelchair (manual), Electronics engineer, and Grab bars  OCCUPATION: retired  PLOF: Independent with household mobility with device  PATIENT GOALS lesson pain, improve gait, sit to be able to travel longer distances   OBJECTIVE:   DIAGNOSTIC FINDINGS:  XR 12/22/20: 1.  Healing unstable traumatic U-shaped sacral fracture (spinopelvic dissociation) status post internal fixation with bilateral iliosacral screws at S1 and A left transsacral transiliac screw at S2. Both hips are located. No hardware complication. No gross change in alignment.   2.  Healed left ischiopubic ramus fracture.   3.  Healing peri-implant pertrochanteric left femoral fracture status post cephalomedullary nail fixation. No hardware is intact without loosening. No change in fracture alignment.   4.  Healed bicondylar distal femoral fracture (extending proximally to the distal third diaphysis) status post ORIF with lateral buttress plate and screws. No hardware complication or change in fracture alignment.   5.  Medial patellar fracture is is not visible on this examination and seen on the CT left knee dated 11/11/2019.   6.  Healed nondisplaced fibular neck fracture.   7.  No new fractures.   8.  No joint misalignment (both sacroiliac, pubic symphysis, and both hips, and left knee).   9.   Status post embolization of multiple arteries (left L3 and L4 lumbar, right L4 lumbar, 2 branches of the left deep circumflex iliac artery, and bilateral iliolumbar arteries (per Interventional Radiology procedure note dated 11/10/2020) with multiple embolization coils in and around the anatomic pelvis. Vascular plug in the right groin.   10.  Status post ACL reconstruction. Femoral interference screw and partially imaged tibial screw are intact without loosening.   11.  Osteopenia.   12.  Moderate degenerative changes of the knee.   13.  Mild degenerative changes of the hips and pubic symphysis.   14.  Degenerative changes of the partially imaged lumbosacral spine.   15.  Partially imaged mesh tacks in the abdomen (along the ventral abdominal wall when correlating with prior CT abdomen/pelvis dated 11/16/2019).     PATIENT SURVEYS:  FOTO 33%, predicted outcome 43%  COGNITION:  Overall cognitive status: Within functional limits for tasks assessed     SENSATION: Not tested  MUSCLE LENGTH: Hamstrings: Right 90 deg; Left 90 deg  POSTURE: weight shift left  PALPATION: Tenderness L lower back, hypomobility lumbar spine.   LUMBAR ROM:   Active  AROM  eval AROM 01/12/22 AROM  03/15/2022  Flexion To feet, increased pain with return To feet, inc pain coming up To feet, pain with return   Extension Limited 75%, increased pain Limited 50% Limited 50%  Right lateral flexion To knee, Increased pain L side To knee - increased pain No pain  Left lateral flexion To knee, Increased pain L side To knee No pain  Right rotation No pain 25% limit No pain  Left rotation No pain  25% limit No pain   (Blank rows = not tested)  LOWER EXTREMITY ROM:     Active  Right eval Left eval  Hip flexion    Hip extension    Hip abduction    Hip adduction    Hip internal rotation 20 20  Hip external rotation 80 50  Knee flexion 125 100  Knee extension 0 Lacking  14   Ankle dorsiflexion    Ankle  plantarflexion    Ankle inversion    Ankle eversion     (Blank rows = not tested)  LOWER EXTREMITY MMT:    MMT Right * eval Left * eval  Hip flexion 5 5  Hip extension    Hip abduction 5 5  Hip adduction 5 5  Knee flexion 5 4  Knee extension 5 5  Ankle dorsiflexion 5 3  Ankle plantarflexion 5 3  Ankle inversion 5 3  Ankle eversion 5 2   (Blank rows = not tested) *reported increased pain with all resisted movements bil.   LUMBAR SPECIAL TESTS:  Straight leg raise test: Negative  FUNCTIONAL TESTS:  5 times sit to stand: 10.4 seconds  GAIT: Distance walked: 50' Assistive device utilized: Environmental consultant - 4 wheeled Level of assistance: Modified independence Comments: no longer wears AFO, L ankle tends to intoe     TODAY'S TREATMENT  03/29/2022 Therapeutic Exercise: to improve strength and mobility.  Demo, verbal and tactile cues throughout for technique. Nustep L6 x 6 min Neuromuscular Reeducation: to improve balance and stability. SBA for safety throughout.    Weight shifts with toe taps on yoga block 3 x 10 bil    Step overs 1/2 foam roller 3 x 10 bil    -wedge lateral L foot to keep foot more plantargrade, noted significant improvement with LLE alignment especially at knee with less varus.   Gait x 180' with SPC and SBA with wedging.  Manual Therapy: to decrease muscle spasm and pain and improve mobility IASTM to L plantar fascia and peroneals, 1st ray mobs, forefoot and midfoot mobs, toe spreading and extension.    Manual Therapy: to decrease muscle spasm, pain and improve mobility.  STM to L plantar fascia, peroneals Toe PROM spreading and extension  03/22/22 Therapeutic Exercise: to improve strength and mobility.  Demo, verbal and tactile cues throughout for technique. Nustep L6 x 6 min Ankle circles x 10 Toe spreads x 10  Clock balance 1/2 circle bil 3x with SPC Fwd step with WS and arm raise x 10 bil Lateral step with arm raise x 10bil Manual Therapy: to decrease  muscle spasm, pain and improve mobility.  STM to L plantar fascia, peroneals Toe PROM spreading and extension   03/15/22 Therapeutic Exercise: to improve strength and mobility.  Demo, verbal and tactile cues throughout for technique. Nustep L6 x 6 min Seated flexion to decreased LBP Therapeutic Activity:  to assess progress towards goals and concerns.  FOTO, DGI, lumbar AROM Manual Therapy: to decrease muscle spasm and pain and improve mobility.  STM/TPR to L peroneals, skilled palpation and monitoring during dry needling. Trigger Point Dry-Needling  Treatment instructions: Expect mild to moderate muscle soreness. S/S of pneumothorax if dry needled over a lung field, and to seek immediate medical attention should they occur. Patient verbalized understanding of these instructions and education. Patient Consent Given: Yes Education handout provided: Previously provided Muscles treated: L peroneals Treatment response/outcome: Twitch Response Elicited and Palpable Increase in Muscle Length     PATIENT EDUCATION:  Education details: HEP update 12/20/21 Person educated: Patient Education method: Explanation, Demonstration, Verbal cues, and Handouts Education comprehension: verbalized understanding and returned demonstration   HOME EXERCISE PROGRAM: Access Code: [email or web address] URL: [email or web address] Date: 12/20/2021 Prepared by: Andrew Banks  Exercises - Long Sitting Calf Stretch with Strap  - 1 x daily - 7 x weekly - 3 sets - 10 reps - Seated Toe Raise  -  1 x daily - 7 x weekly - 3 sets - 10 reps - Ankle Inversion Eversion Towel Slide  - 1 x daily - 7 x weekly - 3 sets - 10 reps - Seated Ankle Eversion with Resistance  - 1 x daily - 7 x weekly - 3 sets - 10 reps - Seated Ankle Inversion Eversion PROM  - 1 x daily - 7 x weekly - 3 sets - 10 reps - Seated Ankle Alphabet  - 1 x daily - 7 x weekly - 3 sets - 10 reps - Supine Posterior Pelvic Tilt  - 1 x daily - 7 x weekly  - 2 sets - 10 reps - Supine March with Posterior Pelvic Tilt  - 1 x daily - 7 x weekly - 2 sets - 10 reps - Dead Bug  - 1 x daily - 7 x weekly - 2 sets - 10 reps - Supine Bridge  - 1 x daily - 7 x weekly - 2 sets - 10 reps - Squat with Chair Touch and Resistance Loop  - 1 x daily - 7 x weekly - 3 sets - 10 reps - Ankle Circles on Wobble Board in Sitting  - 1 x daily - 7 x weekly - 3 sets - 10 reps  ASSESSMENT:  CLINICAL IMPRESSION: Andrew Banks demonstrated difficulty with w/s to LLE with balance exercises, rolling to lateral side of foot and increasing genu varus in L knee, trialed wedging lateral side of L foot (rolled up shelf liner) with immediate improvement in LLE alignment, able to keep dorsum of foot on ground with weightshifting.  When wedging inserted into shoe, he reported immediately improvement in stability and decreased L knee pain with gait with SPC.  [facility] to see if we can get foot orthotic with lateral wedging to improve foot/ankle alignment during gait.  Andrew Banks continues to demonstrate potential for improvement and would benefit from continued skilled therapy to address impairments.      OBJECTIVE IMPAIRMENTS Abnormal gait, decreased activity tolerance, decreased balance, decreased endurance, decreased mobility, difficulty walking, decreased ROM, decreased strength, hypomobility, increased fascial restrictions, increased muscle spasms, impaired flexibility, postural dysfunction, and pain.   ACTIVITY LIMITATIONS carrying, lifting, bending, sitting, standing, sleeping, stairs, transfers, dressing, and locomotion level  PARTICIPATION LIMITATIONS: meal prep, cleaning, laundry, driving, shopping, community activity, yard work, and travel  Andrew Banks, Past/current experiences, Time since onset of injury/illness/exacerbation, and 3+ comorbidities: COPD, chronic LBP, liver transplant, skin cancer, HTN, osteoporosis,  history falls with injury, and surgical hardware  are also affecting patient's functional outcome.   REHAB POTENTIAL: Good  CLINICAL DECISION MAKING: Evolving/moderate complexity  EVALUATION COMPLEXITY: Moderate   GOALS: Goals reviewed with patient? Yes  SHORT TERM GOALS: Target date: 11/01/2021   Patient will be independent with initial HEP.  Baseline:  HEP given 10/25/21 Goal status: MET  good compliance   LONG TERM GOALS: Target date: 05/24/2022  Patient will be independent with advanced/ongoing HEP to improve outcomes and carryover.  Baseline:  Goal status: IN PROGRESS 03/15/22- met for current  2.  Patient will report 50% improvement in low back pain to improve QOL.  Baseline:  Goal status: MET- 30% improvement 01/18/22; 03/15/22- 75% improvement. Now able to lay down and sleep on back.   3.  Patient will demonstrate full pain free lumbar ROM to perform ADLs.   Baseline: increased pain with extension and SB Goal status: IN PROGRESS 03/15/22- improving, but still has pain with  extension, if flexes too far has difficulty returning to neutral.    4.  Patient will demonstrate improved functional strength and balance as demonstrated by DGI = 19/24 to decrease risk of falls.  Baseline: 18/24 on 08/26/21 Goal status: REVISED and MET 03/15/22- 19/24.    5.  Patient will report 43% on lumbar FOTO to demonstrate improved functional ability.  Baseline: 33% Goal status: IN PROGRESS 03/15/22- 40%  6.  Patient will tolerate 15 min of  walking to access community.  Baseline: increased pain after 5-10 min Goal status: IN PROGRESS- 03/15/22 - still limited to ~ 5-6 minutes with 4WRW, today with SPC needed break after 150'.   7.  Patient will be able to tolerate sitting >1 hour without increased LLE pain or swelling in order to travel.  Baseline: 30 min Goal status: MET 01/18/22     PLAN: PT FREQUENCY: 1-2x/week  PT DURATION: 10 weeks   PLANNED INTERVENTIONS: Therapeutic exercises,  Therapeutic activity, Neuromuscular re-education, Balance training, Gait training, Patient/Family education, Self Care, Joint mobilization, Stair training, Orthotic/Fit training, DME instructions, Aquatic Therapy, Dry Needling, Electrical stimulation, Spinal mobilization, Cryotherapy, Moist heat, Manual therapy, and Re-evaluation.  PLAN FOR NEXT SESSION: continue to progress with LE/hip/core strengthening and incorporate more balance training.    Rennie Natter, PT, DPT  03/29/2022, 3:23 PM

## 2022-04-05 ENCOUNTER — Encounter: Payer: Self-pay | Admitting: Physical Therapy

## 2022-04-05 ENCOUNTER — Ambulatory Visit: Payer: Medicare PPO | Attending: Emergency Medicine | Admitting: Physical Therapy

## 2022-04-05 DIAGNOSIS — G8929 Other chronic pain: Secondary | ICD-10-CM | POA: Insufficient documentation

## 2022-04-05 DIAGNOSIS — M25562 Pain in left knee: Secondary | ICD-10-CM | POA: Insufficient documentation

## 2022-04-05 DIAGNOSIS — M5442 Lumbago with sciatica, left side: Secondary | ICD-10-CM | POA: Diagnosis present

## 2022-04-05 DIAGNOSIS — R2689 Other abnormalities of gait and mobility: Secondary | ICD-10-CM | POA: Insufficient documentation

## 2022-04-05 DIAGNOSIS — M6281 Muscle weakness (generalized): Secondary | ICD-10-CM | POA: Diagnosis present

## 2022-04-05 DIAGNOSIS — R2681 Unsteadiness on feet: Secondary | ICD-10-CM | POA: Insufficient documentation

## 2022-04-05 DIAGNOSIS — R262 Difficulty in walking, not elsewhere classified: Secondary | ICD-10-CM | POA: Insufficient documentation

## 2022-04-05 NOTE — Therapy (Signed)
OUTPATIENT PHYSICAL THERAPY TREATMENT     Patient Name: Andrew Banks MRN: [id number] DOB:[birth year], 72 y.o., male Today's Date: 04/05/2022   PT End of Session - 04/05/22 1325     Visit Number 17    Number of Visits 32    Date for PT Re-Evaluation 05/24/22    Authorization Type [facility], [employer]    Authorization Time Period [employer] approved 10 additional visits for total of 32 to 05/25/22    Authorization - Visit Number 3    Authorization - Number of Visits 10    Progress Note Due on Visit 20    PT Start Time 1316    PT Stop Time 1400    PT Time Calculation (min) 44 min    Activity Tolerance Patient tolerated treatment well    Behavior During Therapy WFL for tasks assessed/performed                   Past Medical History:  Diagnosis Date   Arthritis    Degenerative disc disease, lumbar    Hypertension    Liver transplanted [facility])    Skin cancer    Past Surgical History:  Procedure Laterality Date   liver transplant     There are no problems to display for this patient.   PCP: Felipa Eth, MD  REFERRING PROVIDER: [facility]  REFERRING DIAG: M54.50 (ICD-10-CM) - Low back pain, unspecified  Rationale for Evaluation and Treatment Rehabilitation  THERAPY DIAG:  Chronic left-sided low back pain with left-sided sciatica  Muscle weakness (generalized)  Difficulty in walking, not elsewhere classified  Chronic pain of left knee  Unsteadiness on feet  Other abnormalities of gait and mobility  ONSET DATE: chronic  SUBJECTIVE:                                                                                                                                                                                           SUBJECTIVE STATEMENT:  Andrew Banks reports doing well this week, no changes or new concerns.    PERTINENT HISTORY:  History skin cancer, liver transplant, s/p ORIF Pelvic fx 11/12/19, s/p IMN L distal femur 08/12/2019, s/p L IM nailing femur  11/02/20, L  foot drop, lumbar DDD,   PAIN:  Are you having pain? Yes: NPRS scale: 5/10 Pain location: L side low back, hip, pelvis, L knee, L foot  Pain description: constant ache low back/hip; sharp pain stabbing in lower leg,  Aggravating factors: standing/walking long periods >45 min  Relieving factors: sitting down   PRECAUTIONS: Fall  WEIGHT BEARING RESTRICTIONS No  FALLS:  Has patient fallen in last 6 months? No  LIVING ENVIRONMENT: Lives with: lives with their spouse Lives in: House/apartment Stairs: Yes: External: 17 steps; on right going up, on left going up, and can reach both Has following equipment at home: Single point cane, Walker - 4 wheeled, Wheelchair (manual), Electronics engineer, and Grab bars  OCCUPATION: retired  PLOF: Independent with household mobility with device  PATIENT GOALS lesson pain, improve gait, sit to be able to travel longer distances   OBJECTIVE:   DIAGNOSTIC FINDINGS:  XR 12/22/20: 1.  Healing unstable traumatic U-shaped sacral fracture (spinopelvic dissociation) status post internal fixation with bilateral iliosacral screws at S1 and A left transsacral transiliac screw at S2. Both hips are located. No hardware complication. No gross change in alignment.   2.  Healed left ischiopubic ramus fracture.   3.  Healing peri-implant pertrochanteric left femoral fracture status post cephalomedullary nail fixation. No hardware is intact without loosening. No change in fracture alignment.   4.  Healed bicondylar distal femoral fracture (extending proximally to the distal third diaphysis) status post ORIF with lateral buttress plate and screws. No hardware complication or change in fracture alignment.   5.  Medial patellar fracture is is not visible on this examination and seen on the CT left knee dated 11/11/2019.   6.  Healed nondisplaced fibular neck fracture.   7.  No new fractures.   8.  No joint misalignment (both sacroiliac, pubic symphysis, and both hips, and left  knee).   9.  Status post embolization of multiple arteries (left L3 and L4 lumbar, right L4 lumbar, 2 branches of the left deep circumflex iliac artery, and bilateral iliolumbar arteries (per Interventional Radiology procedure note dated 11/10/2020) with multiple embolization coils in and around the anatomic pelvis. Vascular plug in the right groin.   10.  Status post ACL reconstruction. Femoral interference screw and partially imaged tibial screw are intact without loosening.   11.  Osteopenia.   12.  Moderate degenerative changes of the knee.   13.  Mild degenerative changes of the hips and pubic symphysis.   14.  Degenerative changes of the partially imaged lumbosacral spine.   15.  Partially imaged mesh tacks in the abdomen (along the ventral abdominal wall when correlating with prior CT abdomen/pelvis dated 11/16/2019).     PATIENT SURVEYS:  FOTO 33%, predicted outcome 43%  COGNITION:  Overall cognitive status: Within functional limits for tasks assessed     SENSATION: Not tested  MUSCLE LENGTH: Hamstrings: Right 90 deg; Left 90 deg  POSTURE: weight shift left  PALPATION: Tenderness L lower back, hypomobility lumbar spine.   LUMBAR ROM:   Active  AROM  eval AROM 01/12/22 AROM  03/15/2022  Flexion To feet, increased pain with return To feet, inc pain coming up To feet, pain with return   Extension Limited 75%, increased pain Limited 50% Limited 50%  Right lateral flexion To knee, Increased pain L side To knee - increased pain No pain  Left lateral flexion To knee, Increased pain L side To knee No pain  Right rotation No pain 25% limit No pain  Left rotation No pain  25% limit No pain   (Blank rows = not tested)  LOWER EXTREMITY ROM:     Active  Right eval Left eval  Hip flexion    Hip extension    Hip abduction    Hip adduction    Hip internal rotation 20 20  Hip external rotation 80 50  Knee flexion 125 100  Knee extension 0 Lacking  14   Ankle dorsiflexion     Ankle plantarflexion    Ankle inversion    Ankle eversion     (Blank rows = not tested)  LOWER EXTREMITY MMT:    MMT Right * eval Left * eval  Hip flexion 5 5  Hip extension    Hip abduction 5 5  Hip adduction 5 5  Knee flexion 5 4  Knee extension 5 5  Ankle dorsiflexion 5 3  Ankle plantarflexion 5 3  Ankle inversion 5 3  Ankle eversion 5 2   (Blank rows = not tested) *reported increased pain with all resisted movements bil.   LUMBAR SPECIAL TESTS:  Straight leg raise test: Negative  FUNCTIONAL TESTS:  5 times sit to stand: 10.4 seconds  GAIT: Distance walked: 50' Assistive device utilized: Environmental consultant - 4 wheeled Level of assistance: Modified independence Comments: no longer wears AFO, L ankle tends to intoe     TODAY'S TREATMENT  04/05/2022 Therapeutic Exercise: to improve strength and mobility.  Demo, verbal and tactile cues throughout for technique. Bike L2 x 6 min  Knee extension 20# 2 x 10  Knee flexion 20# 1 x 25 Seated rows 25# x 20 high handles, x 20 low handles Leg Press 25# 2 x 20 Standing lat pulls 15# 2 x 10 - SBA for safety Chest press 15# 2 x 10  Resisted walking 5# x 3 - CGA for safety   03/29/2022 Therapeutic Exercise: to improve strength and mobility.  Demo, verbal and tactile cues throughout for technique. Nustep L6 x 6 min Neuromuscular Reeducation: to improve balance and stability. SBA for safety throughout.    Weight shifts with toe taps on yoga block 3 x 10 bil    Step overs 1/2 foam roller 3 x 10 bil    -wedge lateral L foot to keep foot more plantargrade, noted significant improvement with LLE alignment especially at knee with less varus.   Gait x 180' with SPC and SBA with wedging.  Manual Therapy: to decrease muscle spasm and pain and improve mobility IASTM to L plantar fascia and peroneals, 1st ray mobs, forefoot and midfoot mobs, toe spreading and extension.    Manual Therapy: to decrease muscle spasm, pain and improve mobility.  STM  to L plantar fascia, peroneals Toe PROM spreading and extension  03/22/22 Therapeutic Exercise: to improve strength and mobility.  Demo, verbal and tactile cues throughout for technique. Nustep L6 x 6 min Ankle circles x 10 Toe spreads x 10  Clock balance 1/2 circle bil 3x with SPC Fwd step with WS and arm raise x 10 bil Lateral step with arm raise x 10bil Manual Therapy: to decrease muscle spasm, pain and improve mobility.  STM to L plantar fascia, peroneals Toe PROM spreading and extension    PATIENT EDUCATION:  Education details: HEP update 12/20/21 Person educated: Patient Education method: Explanation, Demonstration, Verbal cues, and Handouts Education comprehension: verbalized understanding and returned demonstration   HOME EXERCISE PROGRAM: Access Code: [email or web address] URL: [email or web address] Date: 12/20/2021 Prepared by: Glenetta Hew  Exercises - Long Sitting Calf Stretch with Strap  - 1 x daily - 7 x weekly - 3 sets - 10 reps - Seated Toe Raise  - 1 x daily - 7 x weekly - 3 sets - 10 reps - Ankle Inversion Eversion Towel Slide  - 1 x daily - 7 x weekly - 3 sets - 10 reps - Seated Ankle Eversion with Resistance  - 1 x daily - 7 x  weekly - 3 sets - 10 reps - Seated Ankle Inversion Eversion PROM  - 1 x daily - 7 x weekly - 3 sets - 10 reps - Seated Ankle Alphabet  - 1 x daily - 7 x weekly - 3 sets - 10 reps - Supine Posterior Pelvic Tilt  - 1 x daily - 7 x weekly - 2 sets - 10 reps - Supine March with Posterior Pelvic Tilt  - 1 x daily - 7 x weekly - 2 sets - 10 reps - Dead Bug  - 1 x daily - 7 x weekly - 2 sets - 10 reps - Supine Bridge  - 1 x daily - 7 x weekly - 2 sets - 10 reps - Squat with Chair Touch and Resistance Loop  - 1 x daily - 7 x weekly - 3 sets - 10 reps - Ankle Circles on Wobble Board in Sitting  - 1 x daily - 7 x weekly - 3 sets - 10 reps  ASSESSMENT:  CLINICAL IMPRESSION: Focus of today's skilled therapy was on generalized  strengthening, as patient feels like he is losing muscle bulk.  He tolerated exercises well.  Also worked on balance with standing exercises today.  Alcario Drought continues to demonstrate potential for improvement and would benefit from continued skilled therapy to address impairments.    OBJECTIVE IMPAIRMENTS Abnormal gait, decreased activity tolerance, decreased balance, decreased endurance, decreased mobility, difficulty walking, decreased ROM, decreased strength, hypomobility, increased fascial restrictions, increased muscle spasms, impaired flexibility, postural dysfunction, and pain.   ACTIVITY LIMITATIONS carrying, lifting, bending, sitting, standing, sleeping, stairs, transfers, dressing, and locomotion level  PARTICIPATION LIMITATIONS: meal prep, cleaning, laundry, driving, shopping, community activity, yard work, and travel  [place] Age, Past/current experiences, Time since onset of injury/illness/exacerbation, and 3+ comorbidities: COPD, chronic LBP, liver transplant, skin cancer, HTN, osteoporosis, history falls with injury, and surgical hardware  are also affecting patient's functional outcome.   REHAB POTENTIAL: Good  CLINICAL DECISION MAKING: Evolving/moderate complexity  EVALUATION COMPLEXITY: Moderate   GOALS: Goals reviewed with patient? Yes  SHORT TERM GOALS: Target date: 11/01/2021   Patient will be independent with initial HEP.  Baseline:  HEP given 10/25/21 Goal status: MET  good compliance   LONG TERM GOALS: Target date: 05/24/2022  Patient will be independent with advanced/ongoing HEP to improve outcomes and carryover.  Baseline:  Goal status: IN PROGRESS 03/15/22- met for current  2.  Patient will report 50% improvement in low back pain to improve QOL.  Baseline:  Goal status: MET- 30% improvement 01/18/22; 03/15/22- 75% improvement. Now able to lay down and sleep on back.   3.  Patient will demonstrate full pain free lumbar ROM to perform ADLs.    Baseline: increased pain with extension and SB Goal status: IN PROGRESS 03/15/22- improving, but still has pain with extension, if flexes too far has difficulty returning to neutral.    4.  Patient will demonstrate improved functional strength and balance as demonstrated by DGI = 19/24 to decrease risk of falls.  Baseline: 18/24 on 08/26/21 Goal status: REVISED and MET 03/15/22- 19/24.    5.  Patient will report 43% on lumbar FOTO to demonstrate improved functional ability.  Baseline: 33% Goal status: IN PROGRESS 03/15/22- 40%  6.  Patient will tolerate 15 min of  walking to access community.  Baseline: increased pain after 5-10 min Goal status: IN PROGRESS- 03/15/22 - still limited to ~ 5-6 minutes with 4WRW, today with SPC needed break after 150'.  7.  Patient will be able to tolerate sitting >1 hour without increased LLE pain or swelling in order to travel.  Baseline: 30 min Goal status: MET 01/18/22     PLAN: PT FREQUENCY: 1-2x/week  PT DURATION: 10 weeks   PLANNED INTERVENTIONS: Therapeutic exercises, Therapeutic activity, Neuromuscular re-education, Balance training, Gait training, Patient/Family education, Self Care, Joint mobilization, Stair training, Orthotic/Fit training, DME instructions, Aquatic Therapy, Dry Needling, Electrical stimulation, Spinal mobilization, Cryotherapy, Moist heat, Manual therapy, and Re-evaluation.  PLAN FOR NEXT SESSION: continue to progress with LE/hip/core strengthening and incorporate more balance training.    Rennie Natter, PT, DPT  04/05/2022, 3:38 PM

## 2022-04-13 ENCOUNTER — Ambulatory Visit: Payer: Medicare PPO

## 2022-04-13 DIAGNOSIS — G8929 Other chronic pain: Secondary | ICD-10-CM

## 2022-04-13 DIAGNOSIS — R262 Difficulty in walking, not elsewhere classified: Secondary | ICD-10-CM

## 2022-04-13 DIAGNOSIS — M5442 Lumbago with sciatica, left side: Secondary | ICD-10-CM | POA: Diagnosis not present

## 2022-04-13 DIAGNOSIS — M6281 Muscle weakness (generalized): Secondary | ICD-10-CM

## 2022-04-13 DIAGNOSIS — R2689 Other abnormalities of gait and mobility: Secondary | ICD-10-CM

## 2022-04-13 DIAGNOSIS — R2681 Unsteadiness on feet: Secondary | ICD-10-CM

## 2022-04-13 NOTE — Therapy (Signed)
OUTPATIENT PHYSICAL THERAPY TREATMENT     Patient Name: Andrew Banks MRN: [id number] DOB:12-Jan-1951, 72 y.o., male Today's Date: 04/13/2022   PT End of Session - 04/13/22 1401     Visit Number 18    Number of Visits 32    Date for PT Re-Evaluation 05/24/22    Authorization Type [facility], [employer]    Authorization Time Period [employer] approved 10 additional visits for total of 32 to 05/25/22    Authorization - Visit Number 4    Authorization - Number of Visits 10    Progress Note Due on Visit 20    PT Start Time 1315    PT Stop Time 1400    PT Time Calculation (min) 45 min    Activity Tolerance Patient tolerated treatment well    Behavior During Therapy WFL for tasks assessed/performed                   Past Medical History:  Diagnosis Date   Arthritis    Degenerative disc disease, lumbar    Hypertension    Liver transplanted [facility])    Skin cancer    Past Surgical History:  Procedure Laterality Date   liver transplant     There are no problems to display for this patient.   PCP: Felipa Eth, MD  REFERRING PROVIDER: [facility]  REFERRING DIAG: M54.50 (ICD-10-CM) - Low back pain, unspecified  Rationale for Evaluation and Treatment Rehabilitation  THERAPY DIAG:  Chronic left-sided low back pain with left-sided sciatica  Muscle weakness (generalized)  Difficulty in walking, not elsewhere classified  Chronic pain of left knee  Unsteadiness on feet  Other abnormalities of gait and mobility  ONSET DATE: chronic  SUBJECTIVE:                                                                                                                                                                                           SUBJECTIVE STATEMENT:  [name] denies any changes, just fluctuations in pain from the weather over the past week.  PERTINENT HISTORY:  History skin cancer, liver transplant, s/p ORIF Pelvic fx 11/12/19, s/p IMN L distal femur 08/12/2019, s/p L IM  nailing femur  11/02/20, L foot drop, lumbar DDD,   PAIN:  Are you having pain? Yes: NPRS scale: 5/10 Pain location: L side low back, hip, pelvis, L knee, L foot  Pain description: constant ache low back/hip; sharp pain stabbing in lower leg,  Aggravating factors: standing/walking long periods >45 min  Relieving factors: sitting down   PRECAUTIONS: Fall  WEIGHT BEARING RESTRICTIONS No  FALLS:  Has patient fallen in last 6 months?  No  LIVING ENVIRONMENT: Lives with: lives with their spouse Lives in: House/apartment Stairs: Yes: External: 17 steps; on right going up, on left going up, and can reach both Has following equipment at home: Single point cane, Walker - 4 wheeled, Wheelchair (manual), Electronics engineer, and Grab bars  OCCUPATION: retired  PLOF: Independent with household mobility with device  PATIENT GOALS lesson pain, improve gait, sit to be able to travel longer distances   OBJECTIVE:   DIAGNOSTIC FINDINGS:  XR 12/22/20: 1.  Healing unstable traumatic U-shaped sacral fracture (spinopelvic dissociation) status post internal fixation with bilateral iliosacral screws at S1 and A left transsacral transiliac screw at S2. Both hips are located. No hardware complication. No gross change in alignment.   2.  Healed left ischiopubic ramus fracture.   3.  Healing peri-implant pertrochanteric left femoral fracture status post cephalomedullary nail fixation. No hardware is intact without loosening. No change in fracture alignment.   4.  Healed bicondylar distal femoral fracture (extending proximally to the distal third diaphysis) status post ORIF with lateral buttress plate and screws. No hardware complication or change in fracture alignment.   5.  Medial patellar fracture is is not visible on this examination and seen on the CT left knee dated 11/11/2019.   6.  Healed nondisplaced fibular neck fracture.   7.  No new fractures.   8.  No joint misalignment (both sacroiliac, pubic symphysis,  and both hips, and left knee).   9.  Status post embolization of multiple arteries (left L3 and L4 lumbar, right L4 lumbar, 2 branches of the left deep circumflex iliac artery, and bilateral iliolumbar arteries (per Interventional Radiology procedure note dated 11/10/2020) with multiple embolization coils in and around the anatomic pelvis. Vascular plug in the right groin.   10.  Status post ACL reconstruction. Femoral interference screw and partially imaged tibial screw are intact without loosening.   11.  Osteopenia.   12.  Moderate degenerative changes of the knee.   13.  Mild degenerative changes of the hips and pubic symphysis.   14.  Degenerative changes of the partially imaged lumbosacral spine.   15.  Partially imaged mesh tacks in the abdomen (along the ventral abdominal wall when correlating with prior CT abdomen/pelvis dated 11/16/2019).     PATIENT SURVEYS:  FOTO 33%, predicted outcome 43%  COGNITION:  Overall cognitive status: Within functional limits for tasks assessed     SENSATION: Not tested  MUSCLE LENGTH: Hamstrings: Right 90 deg; Left 90 deg  POSTURE: weight shift left  PALPATION: Tenderness L lower back, hypomobility lumbar spine.   LUMBAR ROM:   Active  AROM  eval AROM 01/12/22 AROM  03/15/2022  Flexion To feet, increased pain with return To feet, inc pain coming up To feet, pain with return   Extension Limited 75%, increased pain Limited 50% Limited 50%  Right lateral flexion To knee, Increased pain L side To knee - increased pain No pain  Left lateral flexion To knee, Increased pain L side To knee No pain  Right rotation No pain 25% limit No pain  Left rotation No pain  25% limit No pain   (Blank rows = not tested)  LOWER EXTREMITY ROM:     Active  Right eval Left eval  Hip flexion    Hip extension    Hip abduction    Hip adduction    Hip internal rotation 20 20  Hip external rotation 80 50  Knee flexion 125 100  Knee extension  0 Lacking 14    Ankle dorsiflexion    Ankle plantarflexion    Ankle inversion    Ankle eversion     (Blank rows = not tested)  LOWER EXTREMITY MMT:    MMT Right * eval Left * eval  Hip flexion 5 5  Hip extension    Hip abduction 5 5  Hip adduction 5 5  Knee flexion 5 4  Knee extension 5 5  Ankle dorsiflexion 5 3  Ankle plantarflexion 5 3  Ankle inversion 5 3  Ankle eversion 5 2   (Blank rows = not tested) *reported increased pain with all resisted movements bil.   LUMBAR SPECIAL TESTS:  Straight leg raise test: Negative  FUNCTIONAL TESTS:  5 times sit to stand: 10.4 seconds  GAIT: Distance walked: 50' Assistive device utilized: Environmental consultant - 4 wheeled Level of assistance: Modified independence Comments: no longer wears AFO, L ankle tends to intoe     TODAY'S TREATMENT  04/13/22 Therapeutic Exercise: to improve strength and mobility.  Demo, verbal and tactile cues throughout for technique. Nustep [id number] Standing toe raises back against counter x 20 Fwd steps x 12 leaning at wall Tandem stance  2x30 sec  Leg press 25# 3x10 bil Knee flexion 25# 2x10 bil Knee extension 20# 2x10 bil Gait 270' [facility]   04/05/2022 Therapeutic Exercise: to improve strength and mobility.  Demo, verbal and tactile cues throughout for technique. Bike L2 x 6 min  Knee extension 20# 2 x 10  Knee flexion 20# 1 x 25 Seated rows 25# x 20 high handles, x 20 low handles Leg Press 25# 2 x 20 Standing lat pulls 15# 2 x 10 - SBA for safety Chest press 15# 2 x 10  Resisted walking 5# x 3 - CGA for safety   03/29/2022 Therapeutic Exercise: to improve strength and mobility.  Demo, verbal and tactile cues throughout for technique. Nustep L6 x 6 min Neuromuscular Reeducation: to improve balance and stability. SBA for safety throughout.    Weight shifts with toe taps on yoga block 3 x 10 bil    Step overs 1/2 foam roller 3 x 10 bil    -wedge lateral L foot to keep foot more plantargrade, noted significant  improvement with LLE alignment especially at knee with less varus.   Gait x 180' with SPC and SBA with wedging.  Manual Therapy: to decrease muscle spasm and pain and improve mobility IASTM to L plantar fascia and peroneals, 1st ray mobs, forefoot and midfoot mobs, toe spreading and extension.    Manual Therapy: to decrease muscle spasm, pain and improve mobility.  STM to L plantar fascia, peroneals Toe PROM spreading and extension  03/22/22 Therapeutic Exercise: to improve strength and mobility.  Demo, verbal and tactile cues throughout for technique. Nustep L6 x 6 min Ankle circles x 10 Toe spreads x 10  Clock balance 1/2 circle bil 3x with SPC Fwd step with WS and arm raise x 10 bil Lateral step with arm raise x 10bil Manual Therapy: to decrease muscle spasm, pain and improve mobility.  STM to L plantar fascia, peroneals Toe PROM spreading and extension    PATIENT EDUCATION:  Education details: HEP update 12/20/21 Person educated: Patient Education method: Explanation, Demonstration, Verbal cues, and Handouts Education comprehension: verbalized understanding and returned demonstration   HOME EXERCISE PROGRAM: Access Code: [email or web address] URL: [email or web address] Date: 12/20/2021 Prepared by: EGlenetta Hew Exercises - Long Sitting Calf Stretch with Strap  - 1 x daily -  7 x weekly - 3 sets - 10 reps - Seated Toe Raise  - 1 x daily - 7 x weekly - 3 sets - 10 reps - Ankle Inversion Eversion Towel Slide  - 1 x daily - 7 x weekly - 3 sets - 10 reps - Seated Ankle Eversion with Resistance  - 1 x daily - 7 x weekly - 3 sets - 10 reps - Seated Ankle Inversion Eversion PROM  - 1 x daily - 7 x weekly - 3 sets - 10 reps - Seated Ankle Alphabet  - 1 x daily - 7 x weekly - 3 sets - 10 reps - Supine Posterior Pelvic Tilt  - 1 x daily - 7 x weekly - 2 sets - 10 reps - Supine March with Posterior Pelvic Tilt  - 1 x daily - 7 x weekly - 2 sets - 10 reps - Dead Bug  - 1 x  daily - 7 x weekly - 2 sets - 10 reps - Supine Bridge  - 1 x daily - 7 x weekly - 2 sets - 10 reps - Squat with Chair Touch and Resistance Loop  - 1 x daily - 7 x weekly - 3 sets - 10 reps - Ankle Circles on Wobble Board in Sitting  - 1 x daily - 7 x weekly - 3 sets - 10 reps  ASSESSMENT:  CLINICAL IMPRESSION: Focus of today's skilled therapy went toward balance and general strengthening. Cues and instruction for proper movement with exercises and to facilitate strategies of balance. Pt was able to ambulate for 120 more ft with SPC w/o rest today, progressing toward LTG # Patient would continue to benefit from skilled therapy to address remaining deficits.  OBJECTIVE IMPAIRMENTS Abnormal gait, decreased activity tolerance, decreased balance, decreased endurance, decreased mobility, difficulty walking, decreased ROM, decreased strength, hypomobility, increased fascial restrictions, increased muscle spasms, impaired flexibility, postural dysfunction, and pain.   ACTIVITY LIMITATIONS carrying, lifting, bending, sitting, standing, sleeping, stairs, transfers, dressing, and locomotion level  PARTICIPATION LIMITATIONS: meal prep, cleaning, laundry, driving, shopping, community activity, yard work, and travel  [place] Age, Past/current experiences, Time since onset of injury/illness/exacerbation, and 3+ comorbidities: COPD, chronic LBP, liver transplant, skin cancer, HTN, osteoporosis, history falls with injury, and surgical hardware  are also affecting patient's functional outcome.   REHAB POTENTIAL: Good  CLINICAL DECISION MAKING: Evolving/moderate complexity  EVALUATION COMPLEXITY: Moderate   GOALS: Goals reviewed with patient? Yes  SHORT TERM GOALS: Target date: 11/01/2021   Patient will be independent with initial HEP.  Baseline:  HEP given 10/25/21 Goal status: MET  good compliance   LONG TERM GOALS: Target date: 05/24/2022  Patient will be independent with advanced/ongoing  HEP to improve outcomes and carryover.  Baseline:  Goal status: IN PROGRESS 03/15/22- met for current  2.  Patient will report 50% improvement in low back pain to improve QOL.  Baseline:  Goal status: MET- 30% improvement 01/18/22; 03/15/22- 75% improvement. Now able to lay down and sleep on back.   3.  Patient will demonstrate full pain free lumbar ROM to perform ADLs.   Baseline: increased pain with extension and SB Goal status: IN PROGRESS 03/15/22- improving, but still has pain with extension, if flexes too far has difficulty returning to neutral.    4.  Patient will demonstrate improved functional strength and balance as demonstrated by DGI = 19/24 to decrease risk of falls.  Baseline: 18/24 on 08/26/21 Goal status: REVISED and MET 03/15/22- 19/24.  5.  Patient will report 43% on lumbar FOTO to demonstrate improved functional ability.  Baseline: 33% Goal status: IN PROGRESS 03/15/22- 40%  6.  Patient will tolerate 15 min of  walking to access community.  Baseline: increased pain after 5-10 min Goal status: IN PROGRESS- 04/13/22 270 ft with SPC w/o rest required (2/13- 150' with SPC)  7.  Patient will be able to tolerate sitting >1 hour without increased LLE pain or swelling in order to travel.  Baseline: 30 min Goal status: MET 01/18/22     PLAN: PT FREQUENCY: 1-2x/week  PT DURATION: 10 weeks   PLANNED INTERVENTIONS: Therapeutic exercises, Therapeutic activity, Neuromuscular re-education, Balance training, Gait training, Patient/Family education, Self Care, Joint mobilization, Stair training, Orthotic/Fit training, DME instructions, Aquatic Therapy, Dry Needling, Electrical stimulation, Spinal mobilization, Cryotherapy, Moist heat, Manual therapy, and Re-evaluation.  PLAN FOR NEXT SESSION: continue to progress with LE/hip/core strengthening and incorporate more balance training.    Artist Pais, PTA 04/13/2022, 2:02 PM

## 2022-04-19 ENCOUNTER — Encounter: Payer: Self-pay | Admitting: Physical Therapy

## 2022-04-19 ENCOUNTER — Ambulatory Visit: Payer: Medicare PPO | Admitting: Physical Therapy

## 2022-04-19 DIAGNOSIS — M6281 Muscle weakness (generalized): Secondary | ICD-10-CM

## 2022-04-19 DIAGNOSIS — R262 Difficulty in walking, not elsewhere classified: Secondary | ICD-10-CM

## 2022-04-19 DIAGNOSIS — G8929 Other chronic pain: Secondary | ICD-10-CM

## 2022-04-19 DIAGNOSIS — R2689 Other abnormalities of gait and mobility: Secondary | ICD-10-CM

## 2022-04-19 DIAGNOSIS — M5442 Lumbago with sciatica, left side: Secondary | ICD-10-CM | POA: Diagnosis not present

## 2022-04-19 DIAGNOSIS — R2681 Unsteadiness on feet: Secondary | ICD-10-CM

## 2022-04-19 NOTE — Therapy (Signed)
OUTPATIENT PHYSICAL THERAPY TREATMENT     Patient Name: Andrew Banks MRN: [id number] DOB:05/08/1950, 72 y.o., male Today's Date: 04/19/2022   PT End of Session - 04/19/22 1315     Visit Number 19    Number of Visits 32    Date for PT Re-Evaluation 05/24/22    Authorization Type [facility], [employer]    Authorization Time Period [employer] approved 10 additional visits for total of 32 to 05/25/22    Authorization - Number of Visits 10    Progress Note Due on Visit 20    PT Start Time 1315    Activity Tolerance Patient tolerated treatment well    Behavior During Therapy [facility] for tasks assessed/performed                   Past Medical History:  Diagnosis Date   Arthritis    Degenerative disc disease, lumbar    Hypertension    Liver transplanted [facility])    Skin cancer    Past Surgical History:  Procedure Laterality Date   liver transplant     There are no problems to display for this patient.   PCP: Felipa Eth, MD  REFERRING PROVIDER: [facility]  REFERRING DIAG: M54.50 (ICD-10-CM) - Low back pain, unspecified  Rationale for Evaluation and Treatment Rehabilitation  THERAPY DIAG:  Chronic left-sided low back pain with left-sided sciatica  Muscle weakness (generalized)  Difficulty in walking, not elsewhere classified  Chronic pain of left knee  Unsteadiness on feet  Other abnormalities of gait and mobility  ONSET DATE: chronic  SUBJECTIVE:                                                                                                                                                                                           SUBJECTIVE STATEMENT:  "Doing ok today, but was better yesterday, today got frozen up"  PERTINENT HISTORY:  History skin cancer, liver transplant, s/p ORIF Pelvic fx 11/12/19, s/p IMN L distal femur 08/12/2019, s/p L IM nailing femur  11/02/20, L foot drop, lumbar DDD,   PAIN:  Are you having pain? Yes: NPRS scale: 6-8/10 Pain location: L knee   Pain description: popping  and snapping, with sharp pain,  Aggravating factors: flexing/extending knee Relieving factors: sitting down   PRECAUTIONS: Fall  WEIGHT BEARING RESTRICTIONS No  FALLS:  Has patient fallen in last 6 months? No  LIVING ENVIRONMENT: Lives with: lives with their spouse Lives in: House/apartment Stairs: Yes: External: 17 steps; on right going up, on left going up, and can reach both Has following equipment at home: Single point cane, Walker - 4 wheeled, Wheelchair (manual),  Shower bench, and Grab bars  OCCUPATION: retired  PLOF: Independent with household mobility with device  PATIENT GOALS lesson pain, improve gait, sit to be able to travel longer distances   OBJECTIVE:   DIAGNOSTIC FINDINGS:  XR 12/22/20: 1.  Healing unstable traumatic U-shaped sacral fracture (spinopelvic dissociation) status post internal fixation with bilateral iliosacral screws at S1 and A left transsacral transiliac screw at S2. Both hips are located. No hardware complication. No gross change in alignment.   2.  Healed left ischiopubic ramus fracture.   3.  Healing peri-implant pertrochanteric left femoral fracture status post cephalomedullary nail fixation. No hardware is intact without loosening. No change in fracture alignment.   4.  Healed bicondylar distal femoral fracture (extending proximally to the distal third diaphysis) status post ORIF with lateral buttress plate and screws. No hardware complication or change in fracture alignment.   5.  Medial patellar fracture is is not visible on this examination and seen on the CT left knee dated 11/11/2019.   6.  Healed nondisplaced fibular neck fracture.   7.  No new fractures.   8.  No joint misalignment (both sacroiliac, pubic symphysis, and both hips, and left knee).   9.  Status post embolization of multiple arteries (left L3 and L4 lumbar, right L4 lumbar, 2 branches of the left deep circumflex iliac artery, and bilateral iliolumbar  arteries (per Interventional Radiology procedure note dated 11/10/2020) with multiple embolization coils in and around the anatomic pelvis. Vascular plug in the right groin.   10.  Status post ACL reconstruction. Femoral interference screw and partially imaged tibial screw are intact without loosening.   11.  Osteopenia.   12.  Moderate degenerative changes of the knee.   13.  Mild degenerative changes of the hips and pubic symphysis.   14.  Degenerative changes of the partially imaged lumbosacral spine.   15.  Partially imaged mesh tacks in the abdomen (along the ventral abdominal wall when correlating with prior CT abdomen/pelvis dated 11/16/2019).     PATIENT SURVEYS:  FOTO 33%, predicted outcome 43%  COGNITION:  Overall cognitive status: Within functional limits for tasks assessed     SENSATION: Not tested  MUSCLE LENGTH: Hamstrings: Right 90 deg; Left 90 deg  POSTURE: weight shift left  PALPATION: Tenderness L lower back, hypomobility lumbar spine.   LUMBAR ROM:   Active  AROM  eval AROM 01/12/22 AROM  03/15/2022  Flexion To feet, increased pain with return To feet, inc pain coming up To feet, pain with return   Extension Limited 75%, increased pain Limited 50% Limited 50%  Right lateral flexion To knee, Increased pain L side To knee - increased pain No pain  Left lateral flexion To knee, Increased pain L side To knee No pain  Right rotation No pain 25% limit No pain  Left rotation No pain  25% limit No pain   (Blank rows = not tested)  LOWER EXTREMITY ROM:     Active  Right eval Left eval  Hip flexion    Hip extension    Hip abduction    Hip adduction    Hip internal rotation 20 20  Hip external rotation 80 50  Knee flexion 125 100  Knee extension 0 Lacking 14   Ankle dorsiflexion    Ankle plantarflexion    Ankle inversion    Ankle eversion     (Blank rows = not tested)  LOWER EXTREMITY MMT:    MMT Right * eval Left *  eval  Hip flexion 5 5  Hip  extension    Hip abduction 5 5  Hip adduction 5 5  Knee flexion 5 4  Knee extension 5 5  Ankle dorsiflexion 5 3  Ankle plantarflexion 5 3  Ankle inversion 5 3  Ankle eversion 5 2   (Blank rows = not tested) *reported increased pain with all resisted movements bil.   LUMBAR SPECIAL TESTS:  Straight leg raise test: Negative  FUNCTIONAL TESTS:  5 times sit to stand: 10.4 seconds  GAIT: Distance walked: 50' Assistive device utilized: Environmental consultant - 4 wheeled Level of assistance: Modified independence Comments: no longer wears AFO, L ankle tends to intoe     TODAY'S TREATMENT  04/19/2022 Therapeutic Exercise: to improve strength and mobility.  Demo, verbal and tactile cues throughout for technique. Nustep L6 x 8 min  Supine Quad sets x 10 x 5 sec hold  SLR 3 x 10 Glut sets 10 x 5 sec hold Manual Therapy: to decrease muscle spasm and pain and improve mobility STM/TPR to L Itband, vastus lateralis,  IASTM with s/s tools to L vastus lateralis and lateral hamstrings.     04/13/22 Therapeutic Exercise: to improve strength and mobility.  Demo, verbal and tactile cues throughout for technique. Nustep L5x76min Standing toe raises back against counter x 20 Fwd steps x 12 leaning at wall Tandem stance  2x30 sec  Leg press 25# 3x10 bil Knee flexion 25# 2x10 bil Knee extension 20# 2x10 bil Gait 270' with [facility]    04/05/2022 Therapeutic Exercise: to improve strength and mobility.  Demo, verbal and tactile cues throughout for technique. Bike L2 x 6 min  Knee extension 20# 2 x 10  Knee flexion 20# 1 x 25 Seated rows 25# x 20 high handles, x 20 low handles Leg Press 25# 2 x 20 Standing lat pulls 15# 2 x 10 - SBA for safety Chest press 15# 2 x 10  Resisted walking 5# x 3 - CGA for safety   03/29/2022 Therapeutic Exercise: to improve strength and mobility.  Demo, verbal and tactile cues throughout for technique. Nustep L6 x 6 min Neuromuscular Reeducation: to improve balance and stability. SBA  for safety throughout.    Weight shifts with toe taps on yoga block 3 x 10 bil    Step overs 1/2 foam roller 3 x 10 bil    -wedge lateral L foot to keep foot more plantargrade, noted significant improvement with LLE alignment especially at knee with less varus.   Gait x 180' with SPC and SBA with wedging.  Manual Therapy: to decrease muscle spasm and pain and improve mobility IASTM to L plantar fascia and peroneals, 1st ray mobs, forefoot and midfoot mobs, toe spreading and extension.    Manual Therapy: to decrease muscle spasm, pain and improve mobility.  STM to L plantar fascia, peroneals Toe PROM spreading and extension  03/22/22 Therapeutic Exercise: to improve strength and mobility.  Demo, verbal and tactile cues throughout for technique. Nustep L6 x 6 min Ankle circles x 10 Toe spreads x 10  Clock balance 1/2 circle bil 3x with SPC Fwd step with WS and arm raise x 10 bil Lateral step with arm raise x 10bil Manual Therapy: to decrease muscle spasm, pain and improve mobility.  STM to L plantar fascia, peroneals Toe PROM spreading and extension    PATIENT EDUCATION:  Education details: HEP update 12/20/21 Person educated: Patient Education method: Explanation, Demonstration, Verbal cues, and Handouts Education comprehension: verbalized understanding  and returned demonstration   HOME EXERCISE PROGRAM: Access Code: [email or web address] URL: [email or web address] Date: 12/20/2021 Prepared by: [place] Sitting Calf Stretch with Strap  - 1 x daily - 7 x weekly - 3 sets - 10 reps - Seated Toe Raise  - 1 x daily - 7 x weekly - 3 sets - 10 reps - Ankle Inversion Eversion Towel Slide  - 1 x daily - 7 x weekly - 3 sets - 10 reps - Seated Ankle Eversion with Resistance  - 1 x daily - 7 x weekly - 3 sets - 10 reps - Seated Ankle Inversion Eversion PROM  - 1 x daily - 7 x weekly - 3 sets - 10 reps - Seated Ankle Alphabet  - 1 x daily - 7 x weekly - 3 sets  - 10 reps - Supine Posterior Pelvic Tilt  - 1 x daily - 7 x weekly - 2 sets - 10 reps - Supine March with Posterior Pelvic Tilt  - 1 x daily - 7 x weekly - 2 sets - 10 reps - Dead Bug  - 1 x daily - 7 x weekly - 2 sets - 10 reps - Supine Bridge  - 1 x daily - 7 x weekly - 2 sets - 10 reps - Squat with Chair Touch and Resistance Loop  - 1 x daily - 7 x weekly - 3 sets - 10 reps - Ankle Circles on Wobble Board in Sitting  - 1 x daily - 7 x weekly - 3 sets - 10 reps  ASSESSMENT:  CLINICAL IMPRESSION: After Nustep, when transitioning from sitting to standing, Carloyn Manner reported large painful snapping in L lateral knee resulting in severe pain, noted increased crepitus and popping of ITBand over hardware of lateral knee, while could not feel hardware shifting, Carloyn Manner reported hardware felt much more prominent than normal and worried hardware had shifted.  Focused remaining session on supine exercises keeping R knee extended to avoid increased pain, and manual therapy to decrease tightness around ITBand to relieve pain.  Alcario Drought will contact his orthopedist when he gets home about new symptoms.  Alcario Drought continues to demonstrate potential for improvement and would benefit from continued skilled therapy to address impairments.     OBJECTIVE IMPAIRMENTS Abnormal gait, decreased activity tolerance, decreased balance, decreased endurance, decreased mobility, difficulty walking, decreased ROM, decreased strength, hypomobility, increased fascial restrictions, increased muscle spasms, impaired flexibility, postural dysfunction, and pain.   ACTIVITY LIMITATIONS carrying, lifting, bending, sitting, standing, sleeping, stairs, transfers, dressing, and locomotion level  PARTICIPATION LIMITATIONS: meal prep, cleaning, laundry, driving, shopping, community activity, yard work, and travel  [place] Age, Past/current experiences, Time since onset of injury/illness/exacerbation, and 3+ comorbidities:  COPD, chronic LBP, liver transplant, skin cancer, HTN, osteoporosis, history falls with injury, and surgical hardware  are also affecting patient's functional outcome.   REHAB POTENTIAL: Good  CLINICAL DECISION MAKING: Evolving/moderate complexity  EVALUATION COMPLEXITY: Moderate   GOALS: Goals reviewed with patient? Yes  SHORT TERM GOALS: Target date: 11/01/2021   Patient will be independent with initial HEP.  Baseline:  HEP given 10/25/21 Goal status: MET  good compliance   LONG TERM GOALS: Target date: 05/24/2022  Patient will be independent with advanced/ongoing HEP to improve outcomes and carryover.  Baseline:  Goal status: IN PROGRESS 03/15/22- met for current  2.  Patient will report 50% improvement in low back pain to improve QOL.  Baseline:  Goal status: MET- 30% improvement 01/18/22; 03/15/22- 75%  improvement. Now able to lay down and sleep on back.   3.  Patient will demonstrate full pain free lumbar ROM to perform ADLs.   Baseline: increased pain with extension and SB Goal status: IN PROGRESS 03/15/22- improving, but still has pain with extension, if flexes too far has difficulty returning to neutral.    4.  Patient will demonstrate improved functional strength and balance as demonstrated by DGI = 19/24 to decrease risk of falls.  Baseline: 18/24 on 08/26/21 Goal status: REVISED and MET 03/15/22- 19/24.    5.  Patient will report 43% on lumbar FOTO to demonstrate improved functional ability.  Baseline: 33% Goal status: IN PROGRESS 03/15/22- 40%  6.  Patient will tolerate 15 min of  walking to access community.  Baseline: increased pain after 5-10 min Goal status: IN PROGRESS- 04/13/22 270 ft with SPC w/o rest required (2/13- 150' with SPC)  7.  Patient will be able to tolerate sitting >1 hour without increased LLE pain or swelling in order to travel.  Baseline: 30 min Goal status: MET 01/18/22     PLAN: PT FREQUENCY: 1-2x/week  PT DURATION: 10 weeks   PLANNED  INTERVENTIONS: Therapeutic exercises, Therapeutic activity, Neuromuscular re-education, Balance training, Gait training, Patient/Family education, Self Care, Joint mobilization, Stair training, Orthotic/Fit training, DME instructions, Aquatic Therapy, Dry Needling, Electrical stimulation, Spinal mobilization, Cryotherapy, Moist heat, Manual therapy, and Re-evaluation.  PLAN FOR NEXT SESSION: continue to progress with LE/hip/core strengthening and incorporate more balance training.    Rennie Natter, PT, DPT  04/19/2022, 1:15 PM

## 2022-04-26 ENCOUNTER — Ambulatory Visit: Payer: Medicare PPO

## 2022-04-26 DIAGNOSIS — M5442 Lumbago with sciatica, left side: Secondary | ICD-10-CM | POA: Diagnosis not present

## 2022-04-26 DIAGNOSIS — M6281 Muscle weakness (generalized): Secondary | ICD-10-CM

## 2022-04-26 DIAGNOSIS — R2689 Other abnormalities of gait and mobility: Secondary | ICD-10-CM

## 2022-04-26 DIAGNOSIS — R2681 Unsteadiness on feet: Secondary | ICD-10-CM

## 2022-04-26 DIAGNOSIS — R262 Difficulty in walking, not elsewhere classified: Secondary | ICD-10-CM

## 2022-04-26 DIAGNOSIS — G8929 Other chronic pain: Secondary | ICD-10-CM

## 2022-04-26 NOTE — Therapy (Signed)
OUTPATIENT PHYSICAL THERAPY TREATMENT Progress Note Reporting Period 03/15/22 to 04/26/2022  See note below for Objective Data and Assessment of Progress/Goals.    Patient has been making good progress, however has slight set back due to new onset L knee pain, waiting for visit with orthopedist to check on hardware in knee.    Rennie Natter, PT, DPT 04/27/2022    Patient Name: Andrew Banks MRN: [id number] DOB:25-May-1950, 72 y.o., male Today's Date: 04/26/2022   PT End of Session - 04/26/22 1350     Visit Number 20    Number of Visits 32    Date for PT Re-Evaluation 05/24/22    Authorization Type [facility], [employer]    Authorization Time Period [employer] approved 10 additional visits for total of 32 to 05/25/22    Authorization - Visit Number 5    Authorization - Number of Visits 10    Progress Note Due on Visit 20    PT Start Time 1316    PT Stop Time 1402    PT Time Calculation (min) 46 min    Activity Tolerance Patient tolerated treatment well    Behavior During Therapy WFL for tasks assessed/performed                    Past Medical History:  Diagnosis Date   Arthritis    Degenerative disc disease, lumbar    Hypertension    Liver transplanted [facility])    Skin cancer    Past Surgical History:  Procedure Laterality Date   liver transplant     There are no problems to display for this patient.   PCP: Felipa Eth, MD  REFERRING PROVIDER: [facility]  REFERRING DIAG: M54.50 (ICD-10-CM) - Low back pain, unspecified  Rationale for Evaluation and Treatment Rehabilitation  THERAPY DIAG:  Chronic left-sided low back pain with left-sided sciatica  Muscle weakness (generalized)  Difficulty in walking, not elsewhere classified  Chronic pain of left knee  Unsteadiness on feet  Other abnormalities of gait and mobility  ONSET DATE: chronic  SUBJECTIVE:                                                                                                                                                                                            SUBJECTIVE STATEMENT:  Still having stiffness and aches, which has increased over the past couple of weeks  PERTINENT HISTORY:  History skin cancer, liver transplant, s/p ORIF Pelvic fx 11/12/19, s/p IMN L distal femur 08/12/2019, s/p L IM nailing femur  11/02/20, L foot drop, lumbar DDD,   PAIN:  Are you having pain? Yes: NPRS scale: 6-8/10  Pain location: L knee  Pain description: popping  and snapping, with sharp pain,  Aggravating factors: flexing/extending knee Relieving factors: sitting down   PRECAUTIONS: Fall  WEIGHT BEARING RESTRICTIONS No  FALLS:  Has patient fallen in last 6 months? No  LIVING ENVIRONMENT: Lives with: lives with their spouse Lives in: House/apartment Stairs: Yes: External: 17 steps; on right going up, on left going up, and can reach both Has following equipment at home: Single point cane, Walker - 4 wheeled, Wheelchair (manual), Electronics engineer, and Grab bars  OCCUPATION: retired  PLOF: Independent with household mobility with device  PATIENT GOALS lesson pain, improve gait, sit to be able to travel longer distances   OBJECTIVE:   DIAGNOSTIC FINDINGS:  XR 12/22/20: 1.  Healing unstable traumatic U-shaped sacral fracture (spinopelvic dissociation) status post internal fixation with bilateral iliosacral screws at S1 and A left transsacral transiliac screw at S2. Both hips are located. No hardware complication. No gross change in alignment.   2.  Healed left ischiopubic ramus fracture.   3.  Healing peri-implant pertrochanteric left femoral fracture status post cephalomedullary nail fixation. No hardware is intact without loosening. No change in fracture alignment.   4.  Healed bicondylar distal femoral fracture (extending proximally to the distal third diaphysis) status post ORIF with lateral buttress plate and screws. No hardware complication or change in fracture alignment.   5.   Medial patellar fracture is is not visible on this examination and seen on the CT left knee dated 11/11/2019.   6.  Healed nondisplaced fibular neck fracture.   7.  No new fractures.   8.  No joint misalignment (both sacroiliac, pubic symphysis, and both hips, and left knee).   9.  Status post embolization of multiple arteries (left L3 and L4 lumbar, right L4 lumbar, 2 branches of the left deep circumflex iliac artery, and bilateral iliolumbar arteries (per Interventional Radiology procedure note dated 11/10/2020) with multiple embolization coils in and around the anatomic pelvis. Vascular plug in the right groin.   10.  Status post ACL reconstruction. Femoral interference screw and partially imaged tibial screw are intact without loosening.   11.  Osteopenia.   12.  Moderate degenerative changes of the knee.   13.  Mild degenerative changes of the hips and pubic symphysis.   14.  Degenerative changes of the partially imaged lumbosacral spine.   15.  Partially imaged mesh tacks in the abdomen (along the ventral abdominal wall when correlating with prior CT abdomen/pelvis dated 11/16/2019).     PATIENT SURVEYS:  FOTO 33%, predicted outcome 43%  COGNITION:  Overall cognitive status: Within functional limits for tasks assessed     SENSATION: Not tested  MUSCLE LENGTH: Hamstrings: Right 90 deg; Left 90 deg  POSTURE: weight shift left  PALPATION: Tenderness L lower back, hypomobility lumbar spine.   LUMBAR ROM:   Active  AROM  eval AROM 01/12/22 AROM  03/15/2022 AROM 04/26/22  Flexion To feet, increased pain with return To feet, inc pain coming up To feet, pain with return  To feet  Extension Limited 75%, increased pain Limited 50% Limited 50% 50%- pain  Right lateral flexion To knee, Increased pain L side To knee - increased pain No pain To mid tight- pain  Left lateral flexion To knee, Increased pain L side To knee No pain To mid tight- pain  Right rotation No pain 25% limit No pain pain   Left rotation No pain  25% limit No pain No pain   (  Blank rows = not tested)  LOWER EXTREMITY ROM:     Active  Right eval Left eval  Hip flexion    Hip extension    Hip abduction    Hip adduction    Hip internal rotation 20 20  Hip external rotation 80 50  Knee flexion 125 100  Knee extension 0 Lacking 14   Ankle dorsiflexion    Ankle plantarflexion    Ankle inversion    Ankle eversion     (Blank rows = not tested)  LOWER EXTREMITY MMT:    MMT Right * eval Left * eval  Hip flexion 5 5  Hip extension    Hip abduction 5 5  Hip adduction 5 5  Knee flexion 5 4  Knee extension 5 5  Ankle dorsiflexion 5 3  Ankle plantarflexion 5 3  Ankle inversion 5 3  Ankle eversion 5 2   (Blank rows = not tested) *reported increased pain with all resisted movements bil.   LUMBAR SPECIAL TESTS:  Straight leg raise test: Negative  FUNCTIONAL TESTS:  5 times sit to stand: 10.4 seconds  GAIT: Distance walked: 50' Assistive device utilized: Environmental consultant - 4 wheeled Level of assistance: Modified independence Comments: no longer wears AFO, L ankle tends to intoe     TODAY'S TREATMENT  04/26/2022 Therapeutic Exercise: to improve strength and mobility.  Demo, verbal and tactile cues throughout for technique. Nustep L6 x 8 min  Seated row GTB x 10  Seated shoulder extension GTB x 10 Seated pallof press GTB x 10 Seated kettle bell swing yellow wt ball x 10 Standing hip abduction 2# x 10  Therapeutic Activity: to improve functional performance. Assessed lumbar AROM and walking tolerance  Manual Therapy: to decrease muscle spasm and pain and improve mobility STM/TPR to L Itband, vastus lateralis,  IASTM with s/s tools to L vastus lateralis and lateral hamstrings.   04/19/2022 Therapeutic Exercise: to improve strength and mobility.  Demo, verbal and tactile cues throughout for technique. Nustep L6 x 8 min  Supine Quad sets x 10 x 5 sec hold  SLR 3 x 10 Glut sets 10 x 5 sec hold Manual  Therapy: to decrease muscle spasm and pain and improve mobility STM/TPR to L Itband, vastus lateralis,  IASTM with s/s tools to L vastus lateralis and lateral hamstrings.     04/13/22 Therapeutic Exercise: to improve strength and mobility.  Demo, verbal and tactile cues throughout for technique. Nustep L5x47min Standing toe raises back against counter x 20 Fwd steps x 12 leaning at wall Tandem stance  2x30 sec  Leg press 25# 3x10 bil Knee flexion 25# 2x10 bil Knee extension 20# 2x10 bil Gait 270' with [facility]    04/05/2022 Therapeutic Exercise: to improve strength and mobility.  Demo, verbal and tactile cues throughout for technique. Bike L2 x 6 min  Knee extension 20# 2 x 10  Knee flexion 20# 1 x 25 Seated rows 25# x 20 high handles, x 20 low handles Leg Press 25# 2 x 20 Standing lat pulls 15# 2 x 10 - SBA for safety Chest press 15# 2 x 10  Resisted walking 5# x 3 - CGA for safety   03/29/2022 Therapeutic Exercise: to improve strength and mobility.  Demo, verbal and tactile cues throughout for technique. Nustep L6 x 6 min Neuromuscular Reeducation: to improve balance and stability. SBA for safety throughout.    Weight shifts with toe taps on yoga block 3 x 10 bil    Step  overs 1/2 foam roller 3 x 10 bil    -wedge lateral L foot to keep foot more plantargrade, noted significant improvement with LLE alignment especially at knee with less varus.   Gait x 180' with SPC and SBA with wedging.  Manual Therapy: to decrease muscle spasm and pain and improve mobility IASTM to L plantar fascia and peroneals, 1st ray mobs, forefoot and midfoot mobs, toe spreading and extension.    Manual Therapy: to decrease muscle spasm, pain and improve mobility.  STM to L plantar fascia, peroneals Toe PROM spreading and extension  03/22/22 Therapeutic Exercise: to improve strength and mobility.  Demo, verbal and tactile cues throughout for technique. Nustep L6 x 6 min Ankle circles x 10 Toe spreads x 10   Clock balance 1/2 circle bil 3x with SPC Fwd step with WS and arm raise x 10 bil Lateral step with arm raise x 10bil Manual Therapy: to decrease muscle spasm, pain and improve mobility.  STM to L plantar fascia, peroneals Toe PROM spreading and extension    PATIENT EDUCATION:  Education details: HEP update 12/20/21 Person educated: Patient Education method: Explanation, Demonstration, Verbal cues, and Handouts Education comprehension: verbalized understanding and returned demonstration   HOME EXERCISE PROGRAM: Access Code: [email or web address] URL: [email or web address] Date: 12/20/2021 Prepared by: [place] Sitting Calf Stretch with Strap  - 1 x daily - 7 x weekly - 3 sets - 10 reps - Seated Toe Raise  - 1 x daily - 7 x weekly - 3 sets - 10 reps - Ankle Inversion Eversion Towel Slide  - 1 x daily - 7 x weekly - 3 sets - 10 reps - Seated Ankle Eversion with Resistance  - 1 x daily - 7 x weekly - 3 sets - 10 reps - Seated Ankle Inversion Eversion PROM  - 1 x daily - 7 x weekly - 3 sets - 10 reps - Seated Ankle Alphabet  - 1 x daily - 7 x weekly - 3 sets - 10 reps - Supine Posterior Pelvic Tilt  - 1 x daily - 7 x weekly - 2 sets - 10 reps - Supine March with Posterior Pelvic Tilt  - 1 x daily - 7 x weekly - 2 sets - 10 reps - Dead Bug  - 1 x daily - 7 x weekly - 2 sets - 10 reps - Supine Bridge  - 1 x daily - 7 x weekly - 2 sets - 10 reps - Squat with Chair Touch and Resistance Loop  - 1 x daily - 7 x weekly - 3 sets - 10 reps - Ankle Circles on Wobble Board in Sitting  - 1 x daily - 7 x weekly - 3 sets - 10 reps  ASSESSMENT:  CLINICAL IMPRESSION: Pt continues to show pain with lumbar extension, B side bend, and L rotation. His walking tolerance is 6-10 min with his rollator or SPC before needing to rest. He also continues to report L knee pain when flexing his knee, feeling that the hardware has shifted, he is trying to get appointment with  Orthopedist, [facility]. Pt remains mostly limited by L knee pain but he did report improved pain after the manual techniques utilized. He would continue to benefit from skilled therapy to improve lumbar AROM with less pain and improve gait tolerance to restore function.   OBJECTIVE IMPAIRMENTS Abnormal gait, decreased activity tolerance, decreased balance, decreased endurance, decreased mobility, difficulty walking, decreased ROM,  decreased strength, hypomobility, increased fascial restrictions, increased muscle spasms, impaired flexibility, postural dysfunction, and pain.   ACTIVITY LIMITATIONS carrying, lifting, bending, sitting, standing, sleeping, stairs, transfers, dressing, and locomotion level  PARTICIPATION LIMITATIONS: meal prep, cleaning, laundry, driving, shopping, community activity, yard work, and travel  [place] Age, Past/current experiences, Time since onset of injury/illness/exacerbation, and 3+ comorbidities: COPD, chronic LBP, liver transplant, skin cancer, HTN, osteoporosis, history falls with injury, and surgical hardware  are also affecting patient's functional outcome.   REHAB POTENTIAL: Good  CLINICAL DECISION MAKING: Evolving/moderate complexity  EVALUATION COMPLEXITY: Moderate   GOALS: Goals reviewed with patient? Yes  SHORT TERM GOALS: Target date: 11/01/2021   Patient will be independent with initial HEP.  Baseline:  HEP given 10/25/21 Goal status: MET  good compliance   LONG TERM GOALS: Target date: 05/24/2022  Patient will be independent with advanced/ongoing HEP to improve outcomes and carryover.  Baseline:  Goal status: IN PROGRESS 03/15/22- met for current  2.  Patient will report 50% improvement in low back pain to improve QOL.  Baseline:  Goal status: MET- 30% improvement 01/18/22; 03/15/22- 75% improvement. Now able to lay down and sleep on back.   3.  Patient will demonstrate full pain free lumbar ROM to perform ADLs.    Baseline: increased pain with extension and SB Goal status: IN PROGRESS 04/26/22- improving, but still has pain with extension, B side-bend, and L rot no pain returning from flexion  4.  Patient will demonstrate improved functional strength and balance as demonstrated by DGI = 19/24 to decrease risk of falls.  Baseline: 18/24 on 08/26/21 Goal status: REVISED and MET 03/15/22- 19/24.    5.  Patient will report 43% on lumbar FOTO to demonstrate improved functional ability.  Baseline: 33% Goal status: IN PROGRESS 03/15/22- 40%  6.  Patient will tolerate 15 min of  walking to access community.  Baseline: increased pain after 5-10 min Goal status: IN PROGRESS- 04/26/22 6-10 mins with RW or SPC  7.  Patient will be able to tolerate sitting >1 hour without increased LLE pain or swelling in order to travel.  Baseline: 30 min Goal status: MET 01/18/22     PLAN: PT FREQUENCY: 1-2x/week  PT DURATION: 10 weeks   PLANNED INTERVENTIONS: Therapeutic exercises, Therapeutic activity, Neuromuscular re-education, Balance training, Gait training, Patient/Family education, Self Care, Joint mobilization, Stair training, Orthotic/Fit training, DME instructions, Aquatic Therapy, Dry Needling, Electrical stimulation, Spinal mobilization, Cryotherapy, Moist heat, Manual therapy, and Re-evaluation.  PLAN FOR NEXT SESSION: continue to progress with LE/hip/core strengthening and incorporate more balance training. Core stabilization; manual to L ITB, quads   Dariyon Urquilla L Carlis Abbott, PTA 04/26/2022, 2:38 PM

## 2022-05-03 ENCOUNTER — Ambulatory Visit: Payer: Medicare PPO | Admitting: Physical Therapy

## 2022-05-10 ENCOUNTER — Ambulatory Visit: Payer: Medicare PPO

## 2022-05-17 ENCOUNTER — Ambulatory Visit: Payer: Medicare PPO | Attending: Emergency Medicine | Admitting: Physical Therapy

## 2022-05-17 ENCOUNTER — Encounter: Payer: Self-pay | Admitting: Physical Therapy

## 2022-05-17 DIAGNOSIS — M6281 Muscle weakness (generalized): Secondary | ICD-10-CM | POA: Diagnosis present

## 2022-05-17 DIAGNOSIS — M25562 Pain in left knee: Secondary | ICD-10-CM | POA: Insufficient documentation

## 2022-05-17 DIAGNOSIS — R2689 Other abnormalities of gait and mobility: Secondary | ICD-10-CM | POA: Diagnosis present

## 2022-05-17 DIAGNOSIS — G8929 Other chronic pain: Secondary | ICD-10-CM | POA: Diagnosis present

## 2022-05-17 DIAGNOSIS — R2681 Unsteadiness on feet: Secondary | ICD-10-CM | POA: Diagnosis present

## 2022-05-17 DIAGNOSIS — M5442 Lumbago with sciatica, left side: Secondary | ICD-10-CM | POA: Insufficient documentation

## 2022-05-17 DIAGNOSIS — R262 Difficulty in walking, not elsewhere classified: Secondary | ICD-10-CM | POA: Insufficient documentation

## 2022-05-17 NOTE — Therapy (Signed)
OUTPATIENT PHYSICAL THERAPY TREATMENT  Patient Name: Andrew Banks MRN: [id number] DOB:03/07/1950, 72 y.o., male Today's Date: 05/17/2022   PT End of Session - 05/17/22 1320     Visit Number 21    Number of Visits 32    Date for PT Re-Evaluation 05/24/22    Authorization Type [facility], [employer]    Authorization Time Period [employer] approved 10 additional visits for total of 32 to 05/25/22    Authorization - Number of Visits 10    Progress Note Due on Visit 30    PT Start Time 1316    PT Stop Time 1400    PT Time Calculation (min) 44 min    Activity Tolerance Patient tolerated treatment well    Behavior During Therapy WFL for tasks assessed/performed                    Past Medical History:  Diagnosis Date   Arthritis    Degenerative disc disease, lumbar    Hypertension    Liver transplanted    Skin cancer    Past Surgical History:  Procedure Laterality Date   liver transplant     There are no problems to display for this patient.   PCP: Mayra Neer, MD  REFERRING PROVIDER: [facility]  REFERRING DIAG: M54.50 (ICD-10-CM) - Low back pain, unspecified  Rationale for Evaluation and Treatment Rehabilitation  THERAPY DIAG:  Chronic left-sided low back pain with left-sided sciatica  Muscle weakness (generalized)  Difficulty in walking, not elsewhere classified  Chronic pain of left knee  Unsteadiness on feet  Other abnormalities of gait and mobility  ONSET DATE: chronic  SUBJECTIVE:                                                                                                                                                                                           SUBJECTIVE STATEMENT:  Saw orthopedist, thinks it the ITBand popping over the hardware in his knee causing the pain, does not think there is any new damage occurring, good to continue PT just stop any exercises that aggravate knee, and offered to take hardware out if desired.  Andrew Banks wants to continue  working on mobility.   PERTINENT HISTORY:  History skin cancer, liver transplant, s/p ORIF Pelvic fx 11/12/19, s/p IMN L distal femur 08/12/2019, s/p L IM nailing femur  11/02/20, L foot drop, lumbar DDD,   PAIN:  Are you having pain? Yes: NPRS scale: 6/10 Pain location: L knee  Pain description: popping  and snapping, with sharp pain,  Aggravating factors: flexing/extending knee Relieving factors: sitting down   PRECAUTIONS: Fall  WEIGHT BEARING RESTRICTIONS  No  FALLS:  Has patient fallen in last 6 months? No  LIVING ENVIRONMENT: Lives with: lives with their spouse Lives in: House/apartment Stairs: Yes: External: 17 steps; on right going up, on left going up, and can reach both Has following equipment at home: Single point cane, Walker - 4 wheeled, Wheelchair (manual), Tour manager, and Grab bars  OCCUPATION: retired  PLOF: Independent with household mobility with device  PATIENT GOALS lesson pain, improve gait, sit to be able to travel longer distances   OBJECTIVE:   DIAGNOSTIC FINDINGS:  XR 12/22/20: 1.  Healing unstable traumatic U-shaped sacral fracture (spinopelvic dissociation) status post internal fixation with bilateral iliosacral screws at S1 and A left transsacral transiliac screw at S2. Both hips are located. No hardware complication. No gross change in alignment.   2.  Healed left ischiopubic ramus fracture.   3.  Healing peri-implant pertrochanteric left femoral fracture status post cephalomedullary nail fixation. No hardware is intact without loosening. No change in fracture alignment.   4.  Healed bicondylar distal femoral fracture (extending proximally to the distal third diaphysis) status post ORIF with lateral buttress plate and screws. No hardware complication or change in fracture alignment.   5.  Medial patellar fracture is is not visible on this examination and seen on the CT left knee dated 11/11/2019.   6.  Healed nondisplaced fibular neck fracture.   7.  No  new fractures.   8.  No joint misalignment (both sacroiliac, pubic symphysis, and both hips, and left knee).   9.  Status post embolization of multiple arteries (left L3 and L4 lumbar, right L4 lumbar, 2 branches of the left deep circumflex iliac artery, and bilateral iliolumbar arteries (per Interventional Radiology procedure note dated 11/10/2020) with multiple embolization coils in and around the anatomic pelvis. Vascular plug in the right groin.   10.  Status post ACL reconstruction. Femoral interference screw and partially imaged tibial screw are intact without loosening.   11.  Osteopenia.   12.  Moderate degenerative changes of the knee.   13.  Mild degenerative changes of the hips and pubic symphysis.   14.  Degenerative changes of the partially imaged lumbosacral spine.   15.  Partially imaged mesh tacks in the abdomen (along the ventral abdominal wall when correlating with prior CT abdomen/pelvis dated 11/16/2019).     PATIENT SURVEYS:  FOTO 33%, predicted outcome 43%  COGNITION:  Overall cognitive status: Within functional limits for tasks assessed     SENSATION: Not tested  MUSCLE LENGTH: Hamstrings: Right 90 deg; Left 90 deg  POSTURE: weight shift left  PALPATION: Tenderness L lower back, hypomobility lumbar spine.   LUMBAR ROM:   Active  AROM  eval AROM 01/12/22 AROM  03/15/2022 AROM 04/26/22  Flexion To feet, increased pain with return To feet, inc pain coming up To feet, pain with return  To feet  Extension Limited 75%, increased pain Limited 50% Limited 50% 50%- pain  Right lateral flexion To knee, Increased pain L side To knee - increased pain No pain To mid tight- pain  Left lateral flexion To knee, Increased pain L side To knee No pain To mid tight- pain  Right rotation No pain 25% limit No pain pain  Left rotation No pain  25% limit No pain No pain   (Blank rows = not tested)  LOWER EXTREMITY ROM:     Active  Right eval Left eval  Hip flexion    Hip  extension  Hip abduction    Hip adduction    Hip internal rotation 20 20  Hip external rotation 80 50  Knee flexion 125 100  Knee extension 0 Lacking 14   Ankle dorsiflexion    Ankle plantarflexion    Ankle inversion    Ankle eversion     (Blank rows = not tested)  LOWER EXTREMITY MMT:    MMT Right * eval Left * eval  Hip flexion 5 5  Hip extension    Hip abduction 5 5  Hip adduction 5 5  Knee flexion 5 4  Knee extension 5 5  Ankle dorsiflexion 5 3  Ankle plantarflexion 5 3  Ankle inversion 5 3  Ankle eversion 5 2   (Blank rows = not tested) *reported increased pain with all resisted movements bil.   LUMBAR SPECIAL TESTS:  Straight leg raise test: Negative  FUNCTIONAL TESTS:  5 times sit to stand: 10.4 seconds  GAIT: Distance walked: 50' Assistive device utilized: Environmental consultant - 4 wheeled Level of assistance: Modified independence Comments: no longer wears AFO, L ankle tends to intoe     TODAY'S TREATMENT   05/17/22 Therapeutic Exercise: to improve strength and mobility.  Demo, verbal and tactile cues throughout for technique. Nustep L5 x 8 min  Stepping and reaching to side 2 x 10 bil - more discomfort stepping to Left.  Stepping and reaching forward 2 x 10 bil  Therapeutic Activity:  gait without AD in between adjustments to L insert adding lateral wedging with soft foam under insert to improve foot positioning, able to ambulate ~ 15 feet before fatiguing and starting to evert L ankle again.      04/26/2022 Therapeutic Exercise: to improve strength and mobility.  Demo, verbal and tactile cues throughout for technique. Nustep L6 x 8 min  Seated row GTB x 10  Seated shoulder extension GTB x 10 Seated pallof press GTB x 10 Seated kettle bell swing yellow wt ball x 10 Standing hip abduction 2# x 10  Therapeutic Activity: to improve functional performance. Assessed lumbar AROM and walking tolerance  Manual Therapy: to decrease muscle spasm and pain and improve  mobility STM/TPR to L Itband, vastus lateralis,  IASTM with s/s tools to L vastus lateralis and lateral hamstrings.   04/19/2022 Therapeutic Exercise: to improve strength and mobility.  Demo, verbal and tactile cues throughout for technique. Nustep L6 x 8 min  Supine Quad sets x 10 x 5 sec hold  SLR 3 x 10 Glut sets 10 x 5 sec hold Manual Therapy: to decrease muscle spasm and pain and improve mobility STM/TPR to L Itband, vastus lateralis,  IASTM with s/s tools to L vastus lateralis and lateral hamstrings.     04/13/22 Therapeutic Exercise: to improve strength and mobility.  Demo, verbal and tactile cues throughout for technique. Nustep L5x53min Standing toe raises back against counter x 20 Fwd steps x 12 leaning at wall Tandem stance  2x30 sec  Leg press 25# 3x10 bil Knee flexion 25# 2x10 bil Knee extension 20# 2x10 bil Gait 270' with [facility]    04/05/2022 Therapeutic Exercise: to improve strength and mobility.  Demo, verbal and tactile cues throughout for technique. Bike L2 x 6 min  Knee extension 20# 2 x 10  Knee flexion 20# 1 x 25 Seated rows 25# x 20 high handles, x 20 low handles Leg Press 25# 2 x 20 Standing lat pulls 15# 2 x 10 - SBA for safety Chest press 15# 2 x 10  Resisted walking 5# x 3 - CGA for safety   03/29/2022 Therapeutic Exercise: to improve strength and mobility.  Demo, verbal and tactile cues throughout for technique. Nustep L6 x 6 min Neuromuscular Reeducation: to improve balance and stability. SBA for safety throughout.    Weight shifts with toe taps on yoga block 3 x 10 bil    Step overs 1/2 foam roller 3 x 10 bil    -wedge lateral L foot to keep foot more plantargrade, noted significant improvement with LLE alignment especially at knee with less varus.   Gait x 180' with SPC and SBA with wedging.  Manual Therapy: to decrease muscle spasm and pain and improve mobility IASTM to L plantar fascia and peroneals, 1st ray mobs, forefoot and midfoot mobs, toe  spreading and extension.    Manual Therapy: to decrease muscle spasm, pain and improve mobility.  STM to L plantar fascia, peroneals Toe PROM spreading and extension  03/22/22 Therapeutic Exercise: to improve strength and mobility.  Demo, verbal and tactile cues throughout for technique. Nustep L6 x 6 min Ankle circles x 10 Toe spreads x 10  Clock balance 1/2 circle bil 3x with SPC Fwd step with WS and arm raise x 10 bil Lateral step with arm raise x 10bil Manual Therapy: to decrease muscle spasm, pain and improve mobility.  STM to L plantar fascia, peroneals Toe PROM spreading and extension    PATIENT EDUCATION:  Education details: HEP update 12/20/21 Person educated: Patient Education method: Explanation, Demonstration, Verbal cues, and Handouts Education comprehension: verbalized understanding and returned demonstration   HOME EXERCISE PROGRAM: Access Code: [email or web address] URL: [email or web address] Date: 12/20/2021 Prepared by: Harrie Foreman  Exercises - Long Sitting Calf Stretch with Strap  - 1 x daily - 7 x weekly - 3 sets - 10 reps - Seated Toe Raise  - 1 x daily - 7 x weekly - 3 sets - 10 reps - Ankle Inversion Eversion Towel Slide  - 1 x daily - 7 x weekly - 3 sets - 10 reps - Seated Ankle Eversion with Resistance  - 1 x daily - 7 x weekly - 3 sets - 10 reps - Seated Ankle Inversion Eversion PROM  - 1 x daily - 7 x weekly - 3 sets - 10 reps - Seated Ankle Alphabet  - 1 x daily - 7 x weekly - 3 sets - 10 reps - Supine Posterior Pelvic Tilt  - 1 x daily - 7 x weekly - 2 sets - 10 reps - Supine March with Posterior Pelvic Tilt  - 1 x daily - 7 x weekly - 2 sets - 10 reps - Dead Bug  - 1 x daily - 7 x weekly - 2 sets - 10 reps - Supine Bridge  - 1 x daily - 7 x weekly - 2 sets - 10 reps - Squat with Chair Touch and Resistance Loop  - 1 x daily - 7 x weekly - 3 sets - 10 reps - Ankle Circles on Wobble Board in Sitting  - 1 x daily - 7 x weekly - 3 sets - 10  reps  ASSESSMENT:  CLINICAL IMPRESSION: Oliver Heitzenrater reports that orthopedist did not feel any damage was occurring in his knee, just irritation of IT band snapping over the hard ware.  He still had obvious discomfort when stepping to the left, and L ankle eversion and L knee valgus where aggravating pain, so worked on adding foam for lateral wedging to improve comfort with  standing and walking and decrease irritation.  [name] continues to demonstrate potential for improvement and would benefit from continued skilled therapy to address impairments.    OBJECTIVE IMPAIRMENTS Abnormal gait, decreased activity tolerance, decreased balance, decreased endurance, decreased mobility, difficulty walking, decreased ROM, decreased strength, hypomobility, increased fascial restrictions, increased muscle spasms, impaired flexibility, postural dysfunction, and pain.   ACTIVITY LIMITATIONS carrying, lifting, bending, sitting, standing, sleeping, stairs, transfers, dressing, and locomotion level  PARTICIPATION LIMITATIONS: meal prep, cleaning, laundry, driving, shopping, community activity, yard work, and travel  PERSONAL FACTORS Age, Past/current experiences, Time since onset of injury/illness/exacerbation, and 3+ comorbidities: COPD, chronic LBP, liver transplant, skin cancer, HTN, osteoporosis, history falls with injury, and surgical hardware  are also affecting patient's functional outcome.   REHAB POTENTIAL: Good  CLINICAL DECISION MAKING: Evolving/moderate complexity  EVALUATION COMPLEXITY: Moderate   GOALS: Goals reviewed with patient? Yes  SHORT TERM GOALS: Target date: 11/01/2021   Patient will be independent with initial HEP.  Baseline:  HEP given 10/25/21 Goal status: MET  good compliance   LONG TERM GOALS: Target date: 05/24/2022  Patient will be independent with advanced/ongoing HEP to improve outcomes and carryover.  Baseline:  Goal status: IN PROGRESS 03/15/22- met for  current  2.  Patient will report 50% improvement in low back pain to improve QOL.  Baseline:  Goal status: MET- 30% improvement 01/18/22; 03/15/22- 75% improvement. Now able to lay down and sleep on back.   3.  Patient will demonstrate full pain free lumbar ROM to perform ADLs.   Baseline: increased pain with extension and SB Goal status: IN PROGRESS 04/26/22- improving, but still has pain with extension, B side-bend, and L rot no pain returning from flexion  4.  Patient will demonstrate improved functional strength and balance as demonstrated by DGI = 19/24 to decrease risk of falls.  Baseline: 18/24 on 08/26/21 Goal status: REVISED and MET 03/15/22- 19/24.    5.  Patient will report 43% on lumbar FOTO to demonstrate improved functional ability.  Baseline: 33% Goal status: IN PROGRESS 03/15/22- 40%  6.  Patient will tolerate 15 min of  walking to access community.  Baseline: increased pain after 5-10 min Goal status: IN PROGRESS- 04/26/22 6-10 mins with RW or SPC  7.  Patient will be able to tolerate sitting >1 hour without increased LLE pain or swelling in order to travel.  Baseline: 30 min Goal status: MET 01/18/22     PLAN: PT FREQUENCY: 1-2x/week  PT DURATION: 10 weeks   PLANNED INTERVENTIONS: Therapeutic exercises, Therapeutic activity, Neuromuscular re-education, Balance training, Gait training, Patient/Family education, Self Care, Joint mobilization, Stair training, Orthotic/Fit training, DME instructions, Aquatic Therapy, Dry Needling, Electrical stimulation, Spinal mobilization, Cryotherapy, Moist heat, Manual therapy, and Re-evaluation.  PLAN FOR NEXT SESSION: continue to progress with LE/hip/core strengthening and incorporate more balance training. Core stabilization; manual to L ITB, quads   Jena Gauss, PT, DPT  05/17/2022, 6:20 PM

## 2022-05-25 ENCOUNTER — Ambulatory Visit: Payer: Medicare PPO

## 2022-05-25 DIAGNOSIS — G8929 Other chronic pain: Secondary | ICD-10-CM

## 2022-05-25 DIAGNOSIS — M5442 Lumbago with sciatica, left side: Secondary | ICD-10-CM | POA: Diagnosis not present

## 2022-05-25 DIAGNOSIS — R262 Difficulty in walking, not elsewhere classified: Secondary | ICD-10-CM

## 2022-05-25 DIAGNOSIS — M6281 Muscle weakness (generalized): Secondary | ICD-10-CM

## 2022-05-25 DIAGNOSIS — R2681 Unsteadiness on feet: Secondary | ICD-10-CM

## 2022-05-25 DIAGNOSIS — R2689 Other abnormalities of gait and mobility: Secondary | ICD-10-CM

## 2022-05-25 NOTE — Addendum Note (Signed)
Addended by: Milinda Pointer on: 05/25/2022 03:52 PM   Modules accepted: Orders

## 2022-05-25 NOTE — Therapy (Addendum)
OUTPATIENT PHYSICAL THERAPY TREATMENT  Patient Name: Andrew Banks MRN: [id number] DOB:Aug 30, 1950, 72 y.o., male Today's Date: 05/25/2022   PT End of Session - 05/25/22 1403     Visit Number 22    Number of Visits 32    Date for PT Re-Evaluation 07/20/22    Authorization Type [facility], [employer]    Authorization Time Period [employer] approved 10 additional visits for total of 32 to 05/25/22    Authorization - Number of Visits 10    Progress Note Due on Visit 30    PT Start Time 1317    PT Stop Time 1400    PT Time Calculation (min) 43 min    Activity Tolerance Patient tolerated treatment well    Behavior During Therapy WFL for tasks assessed/performed                     Past Medical History:  Diagnosis Date   Arthritis    Degenerative disc disease, lumbar    Hypertension    Liver transplanted    Skin cancer    Past Surgical History:  Procedure Laterality Date   liver transplant     There are no problems to display for this patient.   PCP: Mayra Neer, MD  REFERRING PROVIDER: [facility]  REFERRING DIAG: M54.50 (ICD-10-CM) - Low back pain, unspecified  Rationale for Evaluation and Treatment Rehabilitation  THERAPY DIAG:  Chronic left-sided low back pain with left-sided sciatica  Muscle weakness (generalized)  Difficulty in walking, not elsewhere classified  Chronic pain of left knee  Unsteadiness on feet  Other abnormalities of gait and mobility  ONSET DATE: chronic  SUBJECTIVE:                                                                                                                                                                                           SUBJECTIVE STATEMENT: Pt reports less L knee pain, feet feel weird  PERTINENT HISTORY:  History skin cancer, liver transplant, s/p ORIF Pelvic fx 11/12/19, s/p IMN L distal femur 08/12/2019, s/p L IM nailing femur  11/02/20, L foot drop, lumbar DDD,   PAIN:  Are you having pain? Yes: NPRS scale:  4/10 Pain location: L knee  Pain description: popping  and snapping, with sharp pain,  Aggravating factors: flexing/extending knee Relieving factors: sitting down   PRECAUTIONS: Fall  WEIGHT BEARING RESTRICTIONS No  FALLS:  Has patient fallen in last 6 months? No  LIVING ENVIRONMENT: Lives with: lives with their spouse Lives in: House/apartment Stairs: Yes: External: 17 steps; on right going up, on left going up, and can reach both Has following equipment  at home: Single point cane, Environmental consultant - 4 wheeled, Wheelchair (manual), Tour manager, and Grab bars  OCCUPATION: retired  PLOF: Independent with household mobility with device  PATIENT GOALS lesson pain, improve gait, sit to be able to travel longer distances   OBJECTIVE:   DIAGNOSTIC FINDINGS:  XR 12/22/20: 1.  Healing unstable traumatic U-shaped sacral fracture (spinopelvic dissociation) status post internal fixation with bilateral iliosacral screws at S1 and A left transsacral transiliac screw at S2. Both hips are located. No hardware complication. No gross change in alignment.   2.  Healed left ischiopubic ramus fracture.   3.  Healing peri-implant pertrochanteric left femoral fracture status post cephalomedullary nail fixation. No hardware is intact without loosening. No change in fracture alignment.   4.  Healed bicondylar distal femoral fracture (extending proximally to the distal third diaphysis) status post ORIF with lateral buttress plate and screws. No hardware complication or change in fracture alignment.   5.  Medial patellar fracture is is not visible on this examination and seen on the CT left knee dated 11/11/2019.   6.  Healed nondisplaced fibular neck fracture.   7.  No new fractures.   8.  No joint misalignment (both sacroiliac, pubic symphysis, and both hips, and left knee).   9.  Status post embolization of multiple arteries (left L3 and L4 lumbar, right L4 lumbar, 2 branches of the left deep circumflex iliac artery,  and bilateral iliolumbar arteries (per Interventional Radiology procedure note dated 11/10/2020) with multiple embolization coils in and around the anatomic pelvis. Vascular plug in the right groin.   10.  Status post ACL reconstruction. Femoral interference screw and partially imaged tibial screw are intact without loosening.   11.  Osteopenia.   12.  Moderate degenerative changes of the knee.   13.  Mild degenerative changes of the hips and pubic symphysis.   14.  Degenerative changes of the partially imaged lumbosacral spine.   15.  Partially imaged mesh tacks in the abdomen (along the ventral abdominal wall when correlating with prior CT abdomen/pelvis dated 11/16/2019).     PATIENT SURVEYS:  FOTO 33%, predicted outcome 43%  COGNITION:  Overall cognitive status: Within functional limits for tasks assessed     SENSATION: Not tested  MUSCLE LENGTH: Hamstrings: Right 90 deg; Left 90 deg  POSTURE: weight shift left  PALPATION: Tenderness L lower back, hypomobility lumbar spine.   LUMBAR ROM:   Active  AROM  eval AROM 01/12/22 AROM  03/15/2022 AROM 04/26/22 AROM 05/25/22  Flexion To feet, increased pain with return To feet, inc pain coming up To feet, pain with return  To feet To feet  Extension Limited 75%, increased pain Limited 50% Limited 50% 50%- pain 50% limited  Right lateral flexion To knee, Increased pain L side To knee - increased pain No pain To mid tight- pain TO mid tight  Left lateral flexion To knee, Increased pain L side To knee No pain To mid tight- pain To mid thigh  Right rotation No pain 25% limit No pain pain 25% limited  Left rotation No pain  25% limit No pain No pain 25% limited   (Blank rows = not tested)  LOWER EXTREMITY ROM:     Active  Right eval Left eval  Hip flexion    Hip extension    Hip abduction    Hip adduction    Hip internal rotation 20 20  Hip external rotation 80 50  Knee flexion 125 100  Knee extension  0 Lacking 14   Ankle  dorsiflexion    Ankle plantarflexion    Ankle inversion    Ankle eversion     (Blank rows = not tested)  LOWER EXTREMITY MMT:    MMT Right * eval Left * eval  Hip flexion 5 5  Hip extension    Hip abduction 5 5  Hip adduction 5 5  Knee flexion 5 4  Knee extension 5 5  Ankle dorsiflexion 5 3  Ankle plantarflexion 5 3  Ankle inversion 5 3  Ankle eversion 5 2   (Blank rows = not tested) *reported increased pain with all resisted movements bil.   LUMBAR SPECIAL TESTS:  Straight leg raise test: Negative  FUNCTIONAL TESTS:  5 times sit to stand: 10.4 seconds  GAIT: Distance walked: 50' Assistive device utilized: Environmental consultant - 4 wheeled Level of assistance: Modified independence Comments: no longer wears AFO, L ankle tends to intoe     TODAY'S TREATMENT  05/25/22 Therapeutic Exercise: to improve strength and mobility.  Demo, verbal and tactile cues throughout for technique. Nustep L5 x 6 min Lumbar AROM Gait with rollator 720 ft FOTO: 45%  05/17/22 Therapeutic Exercise: to improve strength and mobility.  Demo, verbal and tactile cues throughout for technique. Nustep L5 x 8 min  Stepping and reaching to side 2 x 10 bil - more discomfort stepping to Left.  Stepping and reaching forward 2 x 10 bil  Therapeutic Activity:  gait without AD in between adjustments to L insert adding lateral wedging with soft foam under insert to improve foot positioning, able to ambulate ~ 15 feet before fatiguing and starting to evert L ankle again.      04/26/2022 Therapeutic Exercise: to improve strength and mobility.  Demo, verbal and tactile cues throughout for technique. Nustep L6 x 8 min  Seated row GTB x 10  Seated shoulder extension GTB x 10 Seated pallof press GTB x 10 Seated kettle bell swing yellow wt ball x 10 Standing hip abduction 2# x 10  Therapeutic Activity: to improve functional performance. Assessed lumbar AROM and walking tolerance  Manual Therapy: to decrease muscle  spasm and pain and improve mobility STM/TPR to L Itband, vastus lateralis,  IASTM with s/s tools to L vastus lateralis and lateral hamstrings.   04/19/2022 Therapeutic Exercise: to improve strength and mobility.  Demo, verbal and tactile cues throughout for technique. Nustep L6 x 8 min  Supine Quad sets x 10 x 5 sec hold  SLR 3 x 10 Glut sets 10 x 5 sec hold Manual Therapy: to decrease muscle spasm and pain and improve mobility STM/TPR to L Itband, vastus lateralis,  IASTM with s/s tools to L vastus lateralis and lateral hamstrings.     04/13/22 Therapeutic Exercise: to improve strength and mobility.  Demo, verbal and tactile cues throughout for technique. Nustep L5x61min Standing toe raises back against counter x 20 Fwd steps x 12 leaning at wall Tandem stance  2x30 sec  Leg press 25# 3x10 bil Knee flexion 25# 2x10 bil Knee extension 20# 2x10 bil Gait 270' with [facility]    04/05/2022 Therapeutic Exercise: to improve strength and mobility.  Demo, verbal and tactile cues throughout for technique. Bike L2 x 6 min  Knee extension 20# 2 x 10  Knee flexion 20# 1 x 25 Seated rows 25# x 20 high handles, x 20 low handles Leg Press 25# 2 x 20 Standing lat pulls 15# 2 x 10 - SBA for safety Chest press 15# 2  x 10  Resisted walking 5# x 3 - CGA for safety   03/29/2022 Therapeutic Exercise: to improve strength and mobility.  Demo, verbal and tactile cues throughout for technique. Nustep L6 x 6 min Neuromuscular Reeducation: to improve balance and stability. SBA for safety throughout.    Weight shifts with toe taps on yoga block 3 x 10 bil    Step overs 1/2 foam roller 3 x 10 bil    -wedge lateral L foot to keep foot more plantargrade, noted significant improvement with LLE alignment especially at knee with less varus.   Gait x 180' with SPC and SBA with wedging.  Manual Therapy: to decrease muscle spasm and pain and improve mobility IASTM to L plantar fascia and peroneals, 1st ray mobs, forefoot and  midfoot mobs, toe spreading and extension.    Manual Therapy: to decrease muscle spasm, pain and improve mobility.  STM to L plantar fascia, peroneals Toe PROM spreading and extension  03/22/22 Therapeutic Exercise: to improve strength and mobility.  Demo, verbal and tactile cues throughout for technique. Nustep L6 x 6 min Ankle circles x 10 Toe spreads x 10  Clock balance 1/2 circle bil 3x with SPC Fwd step with WS and arm raise x 10 bil Lateral step with arm raise x 10bil Manual Therapy: to decrease muscle spasm, pain and improve mobility.  STM to L plantar fascia, peroneals Toe PROM spreading and extension    PATIENT EDUCATION:  Education details: HEP update 12/20/21 Person educated: Patient Education method: Explanation, Demonstration, Verbal cues, and Handouts Education comprehension: verbalized understanding and returned demonstration   HOME EXERCISE PROGRAM: Access Code: [email or web address] URL: [email or web address] Date: 12/20/2021 Prepared by: Harrie Foreman  Exercises - Long Sitting Calf Stretch with Strap  - 1 x daily - 7 x weekly - 3 sets - 10 reps - Seated Toe Raise  - 1 x daily - 7 x weekly - 3 sets - 10 reps - Ankle Inversion Eversion Towel Slide  - 1 x daily - 7 x weekly - 3 sets - 10 reps - Seated Ankle Eversion with Resistance  - 1 x daily - 7 x weekly - 3 sets - 10 reps - Seated Ankle Inversion Eversion PROM  - 1 x daily - 7 x weekly - 3 sets - 10 reps - Seated Ankle Alphabet  - 1 x daily - 7 x weekly - 3 sets - 10 reps - Supine Posterior Pelvic Tilt  - 1 x daily - 7 x weekly - 2 sets - 10 reps - Supine March with Posterior Pelvic Tilt  - 1 x daily - 7 x weekly - 2 sets - 10 reps - Dead Bug  - 1 x daily - 7 x weekly - 2 sets - 10 reps - Supine Bridge  - 1 x daily - 7 x weekly - 2 sets - 10 reps - Squat with Chair Touch and Resistance Loop  - 1 x daily - 7 x weekly - 3 sets - 10 reps - Ankle Circles on Wobble Board in Sitting  - 1 x daily - 7 x  weekly - 3 sets - 10 reps  ASSESSMENT:  CLINICAL IMPRESSION: [name] shows improved walking tolerance and functional capacity based on FOTO score. He met LTG #5 today. He remains having difficulty with standing and walking more than 6 min w/o having to take a break. He still has LBP with most movements at the lumbar spine. Based on improvements and still current limitation patient would continue  to benefit from skilled therapy to improve endurance, strength, and ROM to improve overall function. [name] continues to demonstrate potential for improvement and would benefit from continued skilled therapy to address impairments.    OBJECTIVE IMPAIRMENTS Abnormal gait, decreased activity tolerance, decreased balance, decreased endurance, decreased mobility, difficulty walking, decreased ROM, decreased strength, hypomobility, increased fascial restrictions, increased muscle spasms, impaired flexibility, postural dysfunction, and pain.   ACTIVITY LIMITATIONS carrying, lifting, bending, sitting, standing, sleeping, stairs, transfers, dressing, and locomotion level  PARTICIPATION LIMITATIONS: meal prep, cleaning, laundry, driving, shopping, community activity, yard work, and travel  PERSONAL FACTORS Age, Past/current experiences, Time since onset of injury/illness/exacerbation, and 3+ comorbidities: COPD, chronic LBP, liver transplant, skin cancer, HTN, osteoporosis, history falls with injury, and surgical hardware  are also affecting patient's functional outcome.   REHAB POTENTIAL: Good  CLINICAL DECISION MAKING: Evolving/moderate complexity  EVALUATION COMPLEXITY: Moderate   GOALS: Goals reviewed with patient? Yes  SHORT TERM GOALS: Target date: 11/01/2021   Patient will be independent with initial HEP.  Baseline:  HEP given 10/25/21 Goal status: MET  good compliance   LONG TERM GOALS: Target date: 05/24/2022 extended to 07/20/22  Patient will be independent with advanced/ongoing HEP to  improve outcomes and carryover.  Baseline:  Goal status: IN PROGRESS 03/15/22- met for current  2.  Patient will report 50% improvement in low back pain to improve QOL.  Baseline:  Goal status: MET- 30% improvement 01/18/22; 03/15/22- 75% improvement. Now able to lay down and sleep on back.   3.  Patient will demonstrate full pain free lumbar ROM to perform ADLs.   Baseline: increased pain with extension and SB Goal status: IN PROGRESS 04/26/22- improving, but still has pain with extension, B side-bend, and L rot no pain returning from flexion  4.  Patient will demonstrate improved functional strength and balance as demonstrated by DGI = 19/24 to decrease risk of falls.  Baseline: 18/24 on 08/26/21 Goal status: REVISED and MET 03/15/22- 19/24.    5.  Patient will report 43% on lumbar FOTO to demonstrate improved functional ability.  Baseline: 33% Goal status: MET 05/25/22- 45%  6.  Patient will tolerate 15 min of  walking to access community.  Baseline: increased pain after 5-10 min Goal status: IN PROGRESS- 05/25/22- 5 min then break, 6 min after   7.  Patient will be able to tolerate sitting >1 hour without increased LLE pain or swelling in order to travel.  Baseline: 30 min Goal status: MET 01/18/22     PLAN: PT FREQUENCY: 1x/week  PT DURATION: 8 weeks   PLANNED INTERVENTIONS: Therapeutic exercises, Therapeutic activity, Neuromuscular re-education, Balance training, Gait training, Patient/Family education, Self Care, Joint mobilization, Stair training, Orthotic/Fit training, DME instructions, Aquatic Therapy, Dry Needling, Electrical stimulation, Spinal mobilization, Cryotherapy, Moist heat, Manual therapy, and Re-evaluation.  PLAN FOR NEXT SESSION: continue to progress with LE/hip/core strengthening and incorporate more balance training. Core stabilization; manual to L ITB, quads   Darleene Cleaver, PTA 05/25/2022, 3:46 PM   Nedra Hai PT DPT PN2

## 2022-07-19 ENCOUNTER — Ambulatory Visit: Payer: Medicare PPO | Admitting: Physical Therapy

## 2022-07-20 ENCOUNTER — Encounter: Payer: Self-pay | Admitting: Physical Therapy

## 2022-07-20 ENCOUNTER — Ambulatory Visit: Payer: No Typology Code available for payment source | Attending: Emergency Medicine | Admitting: Physical Therapy

## 2022-07-20 DIAGNOSIS — M25562 Pain in left knee: Secondary | ICD-10-CM | POA: Insufficient documentation

## 2022-07-20 DIAGNOSIS — G8929 Other chronic pain: Secondary | ICD-10-CM | POA: Insufficient documentation

## 2022-07-20 DIAGNOSIS — R262 Difficulty in walking, not elsewhere classified: Secondary | ICD-10-CM | POA: Diagnosis present

## 2022-07-20 DIAGNOSIS — M25552 Pain in left hip: Secondary | ICD-10-CM | POA: Insufficient documentation

## 2022-07-20 DIAGNOSIS — M6281 Muscle weakness (generalized): Secondary | ICD-10-CM | POA: Diagnosis present

## 2022-07-20 DIAGNOSIS — R2681 Unsteadiness on feet: Secondary | ICD-10-CM | POA: Insufficient documentation

## 2022-07-20 NOTE — Therapy (Signed)
OUTPATIENT PHYSICAL THERAPY LOWER EXTREMITY EVALUATION   Patient Name: Andrew Banks MRN: [id number] DOB:22-Aug-1950, 72 y.o., male Today's Date: 07/20/2022  END OF SESSION:  PT End of Session - 07/20/22 1539     Visit Number 1    Number of Visits 15    Date for PT Re-Evaluation 09/14/22    Authorization Type VA, [employer]    Authorization Time Period 15 visits approved through [place], pending [employer] authorization    PT Start Time 1447    PT Stop Time 1540    PT Time Calculation (min) 53 min             Past Medical History:  Diagnosis Date   Arthritis    Degenerative disc disease, lumbar    Hypertension    Liver transplanted [facility])    Skin cancer    Past Surgical History:  Procedure Laterality Date   liver transplant     There are no problems to display for this patient.   PCP: Mayra Neer, MD   REFERRING PROVIDER: Clois Comber, PA-C   REFERRING DIAG: [phone] Pain in L knee  THERAPY DIAG:  Chronic pain of left knee  Muscle weakness (generalized)  Difficulty in walking, not elsewhere classified  Pain in left hip  Unsteadiness on feet  Rationale for Evaluation and Treatment: Rehabilitation  ONSET DATE: 04/19/2022 acute onset L knee pain  SUBJECTIVE:   SUBJECTIVE STATEMENT: Patient reports he recently had eye surgery (cataracts) on both eyes, still doing eye drops in both eyes.  Reports much better depth perception now.  Left knee is still a problem, still grinding and popping.  L hip is a problem too.   Planning on returning to orthopedist but haven't since working on eyes.  He would like to get hardware removed from his knee as that is where all the pain is.  Has 5 bolts in the L knee, and the rod in femur and another across the hip socket.  L hip is hurting all the time now, stairs especially make it hurt, making it hard to sleep.  L5 is being constant nuisance too.   He has been taking morphine since [place], but it doesn't help anymore.   PERTINENT  HISTORY: History skin cancer, liver transplant, s/p ORIF Pelvic fx 11/12/19, s/p IMN L distal femur 08/12/2019, s/p L IM nailing femur 11/02/20, L foot drop, lumbar DDD,  PAIN:  Are you having pain? Yes: NPRS scale: 6/10 Pain location: L knee  Pain description: sharp Aggravating factors: bending knee, cold Relieving factors: PT  Are you having pain? Yes: NPRS scale: 7/10 Pain location: L hip Pain description: hurts to touch, hurts all the time Aggravating factors:  stairs Relieving factors: warmth  PRECAUTIONS: Fall  WEIGHT BEARING RESTRICTIONS: No  FALLS:  Has patient fallen in last 6 months? No  LIVING ENVIRONMENT: Lives with: lives with their spouse Lives in: House/apartment Stairs: Yes: Internal: 10 steps; on left going up and External: 17 steps; on right going up, on left going up, and can reach both Has following equipment at home: Single point cane, Walker - 4 wheeled, Wheelchair (manual), Tour manager, and Grab bars  OCCUPATION: retired  PLOF: Independent with household mobility with device  PATIENT GOALS: decrease pain  NEXT MD VISIT: to be scheduled  OBJECTIVE:   DIAGNOSTIC FINDINGS:  XR 12/22/20: 1.  Healing unstable traumatic U-shaped sacral fracture (spinopelvic dissociation) status post internal fixation with bilateral iliosacral screws at S1 and A left transsacral transiliac screw at S2.  Both hips are located. No hardware complication. No gross change in alignment.   2.  Healed left ischiopubic ramus fracture.   3.  Healing peri-implant pertrochanteric left femoral fracture status post cephalomedullary nail fixation. No hardware is intact without loosening. No change in fracture alignment.   4.  Healed bicondylar distal femoral fracture (extending proximally to the distal third diaphysis) status post ORIF with lateral buttress plate and screws. No hardware complication or change in fracture alignment.   5.  Medial patellar fracture is is not visible on this  examination and seen on the CT left knee dated 11/11/2019.   6.  Healed nondisplaced fibular neck fracture.   7.  No new fractures.   8.  No joint misalignment (both sacroiliac, pubic symphysis, and both hips, and left knee).   9.  Status post embolization of multiple arteries (left L3 and L4 lumbar, right L4 lumbar, 2 branches of the left deep circumflex iliac artery, and bilateral iliolumbar arteries (per Interventional Radiology procedure note dated 11/10/2020) with multiple embolization coils in and around the anatomic pelvis. Vascular plug in the right groin.   10.  Status post ACL reconstruction. Femoral interference screw and partially imaged tibial screw are intact without loosening.   11.  Osteopenia.   12.  Moderate degenerative changes of the knee.   13.  Mild degenerative changes of the hips and pubic symphysis.   14.  Degenerative changes of the partially imaged lumbosacral spine.   15.  Partially imaged mesh tacks in the abdomen (along the ventral abdominal wall when correlating with prior CT abdomen/pelvis dated 11/16/2019).     PATIENT SURVEYS:  LEFS 16/80 =20% ability   COGNITION: Overall cognitive status: Within functional limits for tasks assessed     SENSATION: Impaired sensation L foot/fibular nerve distribution   MUSCLE LENGTH: Hamstrings: moderate tightness L Piriformis- significant tightness L  POSTURE: rounded shoulders, forward head, and decreased lumbar lordosis  PALPATION: Tenderness L patellar tendon, L pes anserine, tightness L Itband, crepitus and grinding when extends/flexes knee.  Tenderness in L glut medius and piriformis.    LOWER EXTREMITY ROM: L hip ROM limited by pain, increased L knee pain with all L hip movement today, very limited L hip internal rotation, external rotation ~ 45 deg.   LOWER EXTREMITY MMT:  MMT Right eval Left eval  Hip flexion 4+p! 4+p!  Hip extension 5 4+  Hip abduction 5 5  Hip adduction 5 5  Hip internal rotation    Hip  external rotation    Knee flexion 5 5p!  Knee extension 5 5p!  Ankle dorsiflexion 5 3+  Ankle plantarflexion 3 3  Ankle inversion    Ankle eversion  2   (Blank rows = not tested)  FUNCTIONAL TESTS:  5 times sit to stand: 12.28 seconds without UE assist.   GAIT: Distance walked: 50' Assistive device utilized: Environmental consultant - 4 wheeled Level of assistance: Modified independence Comments: L knee genu varus, unsteady without walker, wide BOS, visually slow gait speed   TODAY'S TREATMENT:  DATE:   07/20/2022 Manual Therapy: to decrease muscle spasm and pain and improve mobility STM/TPR to L glutes, piriformis, cross friction to L Itband, skilled palpation and monitoring during dry needling. Trigger Point Dry-Needling  Treatment instructions: Expect mild to moderate muscle soreness. S/S of pneumothorax if dry needled over a lung field, and to seek immediate medical attention should they occur. Patient verbalized understanding of these instructions and education. Patient Consent Given: Yes Education handout provided: Previously provided Muscles treated: L gluteus medius, L piriformis, L distal quad lateralis and lateral hamstring around Itband.  Treatment response/outcome: Twitch Response Elicited and Palpable Increase in Muscle Length     PATIENT EDUCATION:  Education details: findings, POC, TrDN Person educated: Patient Education method: Explanation Education comprehension: verbalized understanding  HOME EXERCISE PROGRAM: TBD  ASSESSMENT:  CLINICAL IMPRESSION: Patient is a 71 y.o. male who was seen today for physical therapy evaluation and treatment for L knee pain.  [name] "[name]" has been attending PT in the past s/p pelvic fracture and IM nailing, with another fall resulting in another IM nailing, when had acute onset of severe L knee pain, suspected  irritation of Itband snapping over hardware in L knee.  He returns after 2 month hiatus and continues to have severe L knee pain, and also L hip pain as well, and is planning on pursuing having hardware removal at some point in future if symptoms due not improve.  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  Today continues to have significant pain and crepitus in L lateral knee with flexion/extension, also noted more pain in patellar tendon and quads.  He also demonstrates spasm in L glutes and piriformis.  [name] would benefit from skilled physical therapy to decrease pain, improve gait, and improve QOL.   OBJECTIVE IMPAIRMENTS: Abnormal gait, decreased activity tolerance, decreased balance, decreased endurance, decreased mobility, difficulty walking, decreased ROM, decreased strength, increased fascial restrictions, increased muscle spasms, impaired sensation, and pain.   ACTIVITY LIMITATIONS: carrying, lifting, bending, sitting, standing, squatting, sleeping, stairs, transfers, and locomotion level  PARTICIPATION LIMITATIONS: meal prep, cleaning, laundry, driving, shopping, and community activity  PERSONAL FACTORS: Time since onset of injury/illness/exacerbation and 3+ comorbidities: History skin cancer, liver transplant, s/p ORIF Pelvic fx 11/12/19, s/p IMN L distal femur 08/12/2019, s/p L IM nailing femur 11/02/20, L foot drop, lumbar DDD, recent eye surgery  are also affecting patient's functional outcome.   REHAB POTENTIAL: Good  CLINICAL DECISION MAKING: Evolving/moderate complexity  EVALUATION COMPLEXITY: Moderate   GOALS: Goals reviewed with patient? Yes  SHORT TERM GOALS: Target date: 08/03/2022   Patient will be independent with initial HEP. Baseline:  Goal status: INITIAL   LONG TERM GOALS: Target date: 09/14/2022   Patient will be  independent with advanced/ongoing HEP to improve outcomes and carryover.  Baseline:  Goal status: INITIAL  2.  Patient will report at least 50% improvement in left knee pain to improve QOL. Baseline: sharp pain with all knee movement Goal status: INITIAL  3.  Patient will report 75% improvement in L hip pain to improve QOL. Baseline: constant severe pain Goal status: INITIAL  4. Patient will be able to ascend/descend 1 flight of stairs with 1 HR without increased L hip pain to access home.  Baseline: L hip pain increases on stairs to point interferes with sleep Goal status: INITIAL  5.  Patient will report at least  9 points improvement on LEFS to demonstrate improved functional ability. Baseline: 16/80 Goal status: INITIAL  PLAN:  PT FREQUENCY: 1-2x/week  PT DURATION: 8 weeks  PLANNED INTERVENTIONS: Therapeutic exercises, Therapeutic activity, Neuromuscular re-education, Balance training, Gait training, Patient/Family education, Self Care, Joint mobilization, Joint manipulation, Stair training, Orthotic/Fit training, Dry Needling, Electrical stimulation, Spinal manipulation, Spinal mobilization, Cryotherapy, Moist heat, scar mobilization, Taping, Traction, Ultrasound, Ionotophoresis 4mg /ml Dexamethasone, Manual therapy, and Re-evaluation  PLAN FOR NEXT SESSION: did DN help? Hip & knee strengthening, manual therapy to L hip and knee, Iontophoresis   Jena Gauss, PT, DPT 07/20/2022, 3:53 PM

## 2022-07-26 ENCOUNTER — Ambulatory Visit: Payer: Medicare PPO | Admitting: Physical Therapy

## 2022-07-27 ENCOUNTER — Ambulatory Visit: Payer: Medicare PPO | Admitting: Physical Therapy

## 2022-07-28 ENCOUNTER — Encounter: Payer: No Typology Code available for payment source | Admitting: Physical Therapy

## 2022-08-02 ENCOUNTER — Encounter: Payer: No Typology Code available for payment source | Admitting: Physical Therapy

## 2022-08-09 ENCOUNTER — Encounter: Payer: No Typology Code available for payment source | Admitting: Physical Therapy

## 2022-08-11 ENCOUNTER — Ambulatory Visit: Payer: No Typology Code available for payment source

## 2022-08-18 ENCOUNTER — Ambulatory Visit: Payer: No Typology Code available for payment source | Attending: Emergency Medicine | Admitting: Physical Therapy

## 2022-08-18 ENCOUNTER — Encounter: Payer: Self-pay | Admitting: Physical Therapy

## 2022-08-18 DIAGNOSIS — G8929 Other chronic pain: Secondary | ICD-10-CM | POA: Insufficient documentation

## 2022-08-18 DIAGNOSIS — R262 Difficulty in walking, not elsewhere classified: Secondary | ICD-10-CM | POA: Diagnosis present

## 2022-08-18 DIAGNOSIS — M25562 Pain in left knee: Secondary | ICD-10-CM | POA: Diagnosis present

## 2022-08-18 DIAGNOSIS — M6281 Muscle weakness (generalized): Secondary | ICD-10-CM | POA: Diagnosis present

## 2022-08-18 NOTE — Therapy (Signed)
OUTPATIENT PHYSICAL THERAPY TREATMENT   Patient Name: Andrew Banks MRN: [id number] DOB:Jun 07, 1950, 72 y.o., male Today's Date: 08/18/2022  END OF SESSION:  PT End of Session - 08/18/22 1358     Visit Number 2    Number of Visits 15    Date for PT Re-Evaluation 09/14/22    Authorization Type VA, [employer]    Authorization Time Period 15 visits approved through [place], pending [employer] authorization    PT Start Time 1400    PT Stop Time 1449    PT Time Calculation (min) 49 min             Past Medical History:  Diagnosis Date   Arthritis    Degenerative disc disease, lumbar    Hypertension    Liver transplanted [facility])    Skin cancer    Past Surgical History:  Procedure Laterality Date   liver transplant     There are no problems to display for this patient.   PCP: Andrew Neer, MD   REFERRING PROVIDER: Clois Comber, PA-C   REFERRING DIAG: [phone] Pain in L knee  THERAPY DIAG:  Chronic pain of left knee  Muscle weakness (generalized)  Difficulty in walking, not elsewhere classified  Rationale for Evaluation and Treatment: Rehabilitation  ONSET DATE: 04/19/2022 acute onset L knee pain  SUBJECTIVE:   SUBJECTIVE STATEMENT: Andrew Banks reports he continues to have pain in his L knee, not too bad today, but his L foot seems to be getting worse, drawing in now.    The dry needling helped a lot and gave him more mobility in his hip.   PERTINENT HISTORY: History skin cancer, liver transplant, s/p ORIF Pelvic fx 11/12/19, s/p IMN L distal femur 08/12/2019, s/p L IM nailing femur 11/02/20, L foot drop, lumbar DDD,  PAIN:  Are you having pain? Yes: NPRS scale: 5/10 Pain location: L knee  Pain description: sharp Aggravating factors: bending knee, cold Relieving factors: PT    PRECAUTIONS: Fall  WEIGHT BEARING RESTRICTIONS: No  FALLS:  Has patient fallen in last 6 months? No  LIVING ENVIRONMENT: Lives with: lives with their spouse Lives in:  House/apartment Stairs: Yes: Internal: 10 steps; on left going up and External: 17 steps; on right going up, on left going up, and can reach both Has following equipment at home: Single point cane, Walker - 4 wheeled, Wheelchair (manual), Tour manager, and Grab bars  OCCUPATION: retired  PLOF: Independent with household mobility with device  PATIENT GOALS: decrease pain  NEXT MD VISIT: to be scheduled  OBJECTIVE:   DIAGNOSTIC FINDINGS:  XR 12/22/20: 1.  Healing unstable traumatic U-shaped sacral fracture (spinopelvic dissociation) status post internal fixation with bilateral iliosacral screws at S1 and A left transsacral transiliac screw at S2. Both hips are located. No hardware complication. No gross change in alignment.   2.  Healed left ischiopubic ramus fracture.   3.  Healing peri-implant pertrochanteric left femoral fracture status post cephalomedullary nail fixation. No hardware is intact without loosening. No change in fracture alignment.   4.  Healed bicondylar distal femoral fracture (extending proximally to the distal third diaphysis) status post ORIF with lateral buttress plate and screws. No hardware complication or change in fracture alignment.   5.  Medial patellar fracture is is not visible on this examination and seen on the CT left knee dated 11/11/2019.   6.  Healed nondisplaced fibular neck fracture.   7.  No new fractures.   8.  No joint misalignment (both  sacroiliac, pubic symphysis, and both hips, and left knee).   9.  Status post embolization of multiple arteries (left L3 and L4 lumbar, right L4 lumbar, 2 branches of the left deep circumflex iliac artery, and bilateral iliolumbar arteries (per Interventional Radiology procedure note dated 11/10/2020) with multiple embolization coils in and around the anatomic pelvis. Vascular plug in the right groin.   10.  Status post ACL reconstruction. Femoral interference screw and partially imaged tibial screw are intact without loosening.    11.  Osteopenia.   12.  Moderate degenerative changes of the knee.   13.  Mild degenerative changes of the hips and pubic symphysis.   14.  Degenerative changes of the partially imaged lumbosacral spine.   15.  Partially imaged mesh tacks in the abdomen (along the ventral abdominal wall when correlating with prior CT abdomen/pelvis dated 11/16/2019).     PATIENT SURVEYS:  LEFS 16/80 =20% ability   COGNITION: Overall cognitive status: Within functional limits for tasks assessed     SENSATION: Impaired sensation L foot/fibular nerve distribution   MUSCLE LENGTH: Hamstrings: moderate tightness L Piriformis- significant tightness L  POSTURE: rounded shoulders, forward head, and decreased lumbar lordosis  PALPATION: Tenderness L patellar tendon, L pes anserine, tightness L Itband, crepitus and grinding when extends/flexes knee.  Tenderness in L glut medius and piriformis.    LOWER EXTREMITY ROM: L hip ROM limited by pain, increased L knee pain with all L hip movement today, very limited L hip internal rotation, external rotation ~ 45 deg.   LOWER EXTREMITY MMT:  MMT Right eval Left eval  Hip flexion 4+p! 4+p!  Hip extension 5 4+  Hip abduction 5 5  Hip adduction 5 5  Hip internal rotation    Hip external rotation    Knee flexion 5 5p!  Knee extension 5 5p!  Ankle dorsiflexion 5 3+  Ankle plantarflexion 3 3  Ankle inversion    Ankle eversion  2   (Blank rows = not tested)  FUNCTIONAL TESTS:  5 times sit to stand: 12.28 seconds without UE assist.   GAIT: Distance walked: 50' Assistive device utilized: Environmental consultant - 4 wheeled Level of assistance: Modified independence Comments: L knee genu varus, unsteady without walker, wide BOS, visually slow gait speed   TODAY'S TREATMENT:                                                                                                                              DATE:  08/18/2022 Therapeutic Exercise: to improve strength and mobility.   Demo, verbal and tactile cues throughout for technique. SLR 2 x 10 each side 2#  Bridges 2 x 10  Clams 2 x 10 each side Prone leg extension 2 x 10 - 2# on right side, 0# on left Manual Therapy: to decrease muscle spasm and pain and improve mobility STM/TPR to peroneals, mobs to ankle mortise joint, plantar stretches, 1st ray mobs, skilled palpation and monitoring during  dry needling. Trigger Point Dry-Needling  Treatment instructions: Expect mild to moderate muscle soreness. S/S of pneumothorax if dry needled over a lung field, and to seek immediate medical attention should they occur. Patient verbalized understanding of these instructions and education. Patient Consent Given: Yes Education handout provided: Previously provided Muscles treated: L fibularis muscles Electrical stimulation performed: No Parameters: N/A Treatment response/outcome: Twitch Response Elicited and Palpable Increase in Muscle Length   07/20/2022 Manual Therapy: to decrease muscle spasm and pain and improve mobility STM/TPR to L glutes, piriformis, cross friction to L Itband, skilled palpation and monitoring during dry needling. Trigger Point Dry-Needling  Treatment instructions: Expect mild to moderate muscle soreness. S/S of pneumothorax if dry needled over a lung field, and to seek immediate medical attention should they occur. Patient verbalized understanding of these instructions and education. Patient Consent Given: Yes Education handout provided: Previously provided Muscles treated: L gluteus medius, L piriformis, L distal quad lateralis and lateral hamstring around Itband.  Treatment response/outcome: Twitch Response Elicited and Palpable Increase in Muscle Length     PATIENT EDUCATION:  Education details: findings, POC, TrDN Person educated: Patient Education method: Explanation Education comprehension: verbalized understanding  HOME EXERCISE PROGRAM: Access Code: [email or web address] URL:  [email or web address] Date: 08/18/2022 Prepared by: Harrie Foreman  Exercises - Straight Leg Raise with Ankle Weight  - 1 x daily - 7 x weekly - 3 sets - 10 reps - Prone Hip Extension with Foot in Dorsiflexion and Ankle Weight  - 1 x daily - 7 x weekly - 3 sets - 10 reps - Clamshell  - 1 x daily - 7 x weekly - 3 sets - 10 reps - Supine Bridge  - 1 x daily - 7 x weekly - 3 sets - 10 reps  ASSESSMENT:  CLINICAL IMPRESSION: Andrew Banks demonstrates worsening L foot contracture, skin changes along dorsum of L foot, and 0/10 in extensor hallicus longus, although he does some ankle eversion and dorsiflexion, with the muscle imbalance and 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  Today focused on hip strengthening exercises for HEP, recommended adjustable ankle weights for home use as well.   TrDN and manual therapy to ankle today also as he is losing ankle mobility.  Andrew Banks continues to demonstrate potential for improvement and would benefit from continued skilled therapy to address impairments.        OBJECTIVE IMPAIRMENTS: Abnormal gait, decreased activity tolerance, decreased balance, decreased endurance, decreased mobility, difficulty walking, decreased ROM, decreased strength, increased fascial restrictions, increased muscle spasms, impaired sensation, and pain.   ACTIVITY LIMITATIONS: carrying, lifting, bending, sitting, standing, squatting, sleeping, stairs, transfers, and locomotion level  PARTICIPATION LIMITATIONS: meal prep, cleaning, laundry, driving, shopping, and community activity  PERSONAL FACTORS: Time since onset of injury/illness/exacerbation and 3+ comorbidities: History skin cancer, liver transplant, s/p ORIF Pelvic fx 11/12/19, s/p IMN L distal femur 08/12/2019, s/p L IM  nailing femur 11/02/20, L foot drop, lumbar DDD, recent eye surgery  are also affecting patient's functional outcome.   REHAB POTENTIAL: Good  CLINICAL DECISION MAKING: Evolving/moderate complexity  EVALUATION COMPLEXITY: Moderate   GOALS: Goals reviewed with patient? Yes  SHORT TERM GOALS: Target date: 08/03/2022   Patient will be independent with initial HEP. Baseline:  Goal status: IN PROGRESS   LONG TERM GOALS: Target date: 09/14/2022   Patient will be  independent with advanced/ongoing HEP to improve outcomes and carryover.  Baseline:  Goal status: IN PROGRESS  2.  Patient will report at least 50% improvement in left knee pain to improve QOL. Baseline: sharp pain with all knee movement Goal status: IN PROGRESS  3.  Patient will report 75% improvement in L hip pain to improve QOL. Baseline: constant severe pain Goal status: IN PROGRESS  4. Patient will be able to ascend/descend 1 flight of stairs with 1 HR without increased L hip pain to access home.  Baseline: L hip pain increases on stairs to point interferes with sleep Goal status: IN PROGRESS  5.  Patient will report at least 9 points improvement on LEFS to demonstrate improved functional ability. Baseline: 16/80 Goal status: IN PROGRESS  PLAN:  PT FREQUENCY: 1-2x/week  PT DURATION: 8 weeks  PLANNED INTERVENTIONS: Therapeutic exercises, Therapeutic activity, Neuromuscular re-education, Balance training, Gait training, Patient/Family education, Self Care, Joint mobilization, Joint manipulation, Stair training, Orthotic/Fit training, Dry Needling, Electrical stimulation, Spinal manipulation, Spinal mobilization, Cryotherapy, Moist heat, scar mobilization, Taping, Traction, Ultrasound, Ionotophoresis 4mg /ml Dexamethasone, Manual therapy, and Re-evaluation  PLAN FOR NEXT SESSION:  Hip & knee strengthening, manual therapy to L hip and knee, Iontophoresis to knee   Jena Gauss, PT, DPT 08/18/2022, 6:24 PM

## 2022-08-23 ENCOUNTER — Ambulatory Visit: Payer: No Typology Code available for payment source

## 2022-08-23 DIAGNOSIS — M6281 Muscle weakness (generalized): Secondary | ICD-10-CM

## 2022-08-23 DIAGNOSIS — R262 Difficulty in walking, not elsewhere classified: Secondary | ICD-10-CM

## 2022-08-23 DIAGNOSIS — M25562 Pain in left knee: Secondary | ICD-10-CM | POA: Diagnosis not present

## 2022-08-23 DIAGNOSIS — G8929 Other chronic pain: Secondary | ICD-10-CM

## 2022-08-23 NOTE — Therapy (Signed)
OUTPATIENT PHYSICAL THERAPY TREATMENT   Patient Name: Andrew Banks MRN: [id number] DOB:12/18/1950, 72 y.o., male Today's Date: 08/23/2022  END OF SESSION:  PT End of Session - 08/23/22 1410     Visit Number 3    Number of Visits 15    Date for PT Re-Evaluation 09/14/22    Authorization Type VA, [employer]    Authorization Time Period 15 visits approved through [place], pending [employer] authorization    PT Start Time 1402    PT Stop Time 1444    PT Time Calculation (min) 42 min    Activity Tolerance Patient tolerated treatment well    Behavior During Therapy WFL for tasks assessed/performed              Past Medical History:  Diagnosis Date   Arthritis    Degenerative disc disease, lumbar    Hypertension    Liver transplanted [facility])    Skin cancer    Past Surgical History:  Procedure Laterality Date   liver transplant     There are no problems to display for this patient.   PCP: Mayra Neer, MD   REFERRING PROVIDER: Clois Comber, PA-C   REFERRING DIAG: [phone] Pain in L knee  THERAPY DIAG:  Chronic pain of left knee  Muscle weakness (generalized)  Difficulty in walking, not elsewhere classified  Rationale for Evaluation and Treatment: Rehabilitation  ONSET DATE: 04/19/2022 acute onset L knee pain  SUBJECTIVE:   SUBJECTIVE STATEMENT:  Pt reports less pain today.  PERTINENT HISTORY: History skin cancer, liver transplant, s/p ORIF Pelvic fx 11/12/19, s/p IMN L distal femur 08/12/2019, s/p L IM nailing femur 11/02/20, L foot drop, lumbar DDD,  PAIN:  Are you having pain? Yes: NPRS scale: 5/10 Pain location: L knee  Pain description: sharp Aggravating factors: bending knee, cold Relieving factors: PT    PRECAUTIONS: Fall  WEIGHT BEARING RESTRICTIONS: No  FALLS:  Has patient fallen in last 6 months? No  LIVING ENVIRONMENT: Lives with: lives with their spouse Lives in: House/apartment Stairs: Yes: Internal: 10 steps; on left going up and  External: 17 steps; on right going up, on left going up, and can reach both Has following equipment at home: Single point cane, Walker - 4 wheeled, Wheelchair (manual), Tour manager, and Grab bars  OCCUPATION: retired  PLOF: Independent with household mobility with device  PATIENT GOALS: decrease pain  NEXT MD VISIT: to be scheduled  OBJECTIVE:   DIAGNOSTIC FINDINGS:  XR 12/22/20: 1.  Healing unstable traumatic U-shaped sacral fracture (spinopelvic dissociation) status post internal fixation with bilateral iliosacral screws at S1 and A left transsacral transiliac screw at S2. Both hips are located. No hardware complication. No gross change in alignment.   2.  Healed left ischiopubic ramus fracture.   3.  Healing peri-implant pertrochanteric left femoral fracture status post cephalomedullary nail fixation. No hardware is intact without loosening. No change in fracture alignment.   4.  Healed bicondylar distal femoral fracture (extending proximally to the distal third diaphysis) status post ORIF with lateral buttress plate and screws. No hardware complication or change in fracture alignment.   5.  Medial patellar fracture is is not visible on this examination and seen on the CT left knee dated 11/11/2019.   6.  Healed nondisplaced fibular neck fracture.   7.  No new fractures.   8.  No joint misalignment (both sacroiliac, pubic symphysis, and both hips, and left knee).   9.  Status post embolization of multiple arteries (left  L3 and L4 lumbar, right L4 lumbar, 2 branches of the left deep circumflex iliac artery, and bilateral iliolumbar arteries (per Interventional Radiology procedure note dated 11/10/2020) with multiple embolization coils in and around the anatomic pelvis. Vascular plug in the right groin.   10.  Status post ACL reconstruction. Femoral interference screw and partially imaged tibial screw are intact without loosening.   11.  Osteopenia.   12.  Moderate degenerative changes of the knee.    13.  Mild degenerative changes of the hips and pubic symphysis.   14.  Degenerative changes of the partially imaged lumbosacral spine.   15.  Partially imaged mesh tacks in the abdomen (along the ventral abdominal wall when correlating with prior CT abdomen/pelvis dated 11/16/2019).     PATIENT SURVEYS:  LEFS 16/80 =20% ability   COGNITION: Overall cognitive status: Within functional limits for tasks assessed     SENSATION: Impaired sensation L foot/fibular nerve distribution   MUSCLE LENGTH: Hamstrings: moderate tightness L Piriformis- significant tightness L  POSTURE: rounded shoulders, forward head, and decreased lumbar lordosis  PALPATION: Tenderness L patellar tendon, L pes anserine, tightness L Itband, crepitus and grinding when extends/flexes knee.  Tenderness in L glut medius and piriformis.    LOWER EXTREMITY ROM: L hip ROM limited by pain, increased L knee pain with all L hip movement today, very limited L hip internal rotation, external rotation ~ 45 deg.   LOWER EXTREMITY MMT:  MMT Right eval Left eval  Hip flexion 4+p! 4+p!  Hip extension 5 4+  Hip abduction 5 5  Hip adduction 5 5  Hip internal rotation    Hip external rotation    Knee flexion 5 5p!  Knee extension 5 5p!  Ankle dorsiflexion 5 3+  Ankle plantarflexion 3 3  Ankle inversion    Ankle eversion  2   (Blank rows = not tested)  FUNCTIONAL TESTS:  5 times sit to stand: 12.28 seconds without UE assist.   GAIT: Distance walked: 50' Assistive device utilized: Environmental consultant - 4 wheeled Level of assistance: Modified independence Comments: L knee genu varus, unsteady without walker, wide BOS, visually slow gait speed   TODAY'S TREATMENT:                                                                                                                              DATE:  08/23/2022 Therapeutic Exercise: to improve strength and mobility.  Demo, verbal and tactile cues throughout for technique. Nustep  L5x1min Bridges 3x10 SLR 2# 2x10 bil Sit to stands x 20  Bathroom break for patient Knee flexion 20# 2x10 Knee extension 10# 2x10  08/18/2022 Therapeutic Exercise: to improve strength and mobility.  Demo, verbal and tactile cues throughout for technique. SLR 2 x 10 each side 2#  Bridges 2 x 10  Clams 2 x 10 each side Prone leg extension 2 x 10 - 2# on right side, 0# on left Manual Therapy: to decrease muscle  spasm and pain and improve mobility STM/TPR to peroneals, mobs to ankle mortise joint, plantar stretches, 1st ray mobs, skilled palpation and monitoring during dry needling. Trigger Point Dry-Needling  Treatment instructions: Expect mild to moderate muscle soreness. S/S of pneumothorax if dry needled over a lung field, and to seek immediate medical attention should they occur. Patient verbalized understanding of these instructions and education. Patient Consent Given: Yes Education handout provided: Previously provided Muscles treated: L fibularis muscles Electrical stimulation performed: No Parameters: N/A Treatment response/outcome: Twitch Response Elicited and Palpable Increase in Muscle Length   07/20/2022 Manual Therapy: to decrease muscle spasm and pain and improve mobility STM/TPR to L glutes, piriformis, cross friction to L Itband, skilled palpation and monitoring during dry needling. Trigger Point Dry-Needling  Treatment instructions: Expect mild to moderate muscle soreness. S/S of pneumothorax if dry needled over a lung field, and to seek immediate medical attention should they occur. Patient verbalized understanding of these instructions and education. Patient Consent Given: Yes Education handout provided: Previously provided Muscles treated: L gluteus medius, L piriformis, L distal quad lateralis and lateral hamstring around Itband.  Treatment response/outcome: Twitch Response Elicited and Palpable Increase in Muscle Length     PATIENT EDUCATION:  Education details:  findings, POC, TrDN Person educated: Patient Education method: Explanation Education comprehension: verbalized understanding  HOME EXERCISE PROGRAM: Access Code: [email or web address] URL: [email or web address] Date: 08/18/2022 Prepared by: Harrie Foreman  Exercises - Straight Leg Raise with Ankle Weight  - 1 x daily - 7 x weekly - 3 sets - 10 reps - Prone Hip Extension with Foot in Dorsiflexion and Ankle Weight  - 1 x daily - 7 x weekly - 3 sets - 10 reps - Clamshell  - 1 x daily - 7 x weekly - 3 sets - 10 reps - Supine Bridge  - 1 x daily - 7 x weekly - 3 sets - 10 reps  ASSESSMENT:  CLINICAL IMPRESSION: Mr Ruark was able to complete all interventions. We continued working on LE strengthening preferably in NWB today. He did require one bathroom break during the session. Introduced Therapist, art today with no issues.  [name] continues to demonstrate potential for improvement and would benefit from continued skilled therapy to address impairments.        OBJECTIVE IMPAIRMENTS: Abnormal gait, decreased activity tolerance, decreased balance, decreased endurance, decreased mobility, difficulty walking, decreased ROM, decreased strength, increased fascial restrictions, increased muscle spasms, impaired sensation, and pain.   ACTIVITY LIMITATIONS: carrying, lifting, bending, sitting, standing, squatting, sleeping, stairs, transfers, and locomotion level  PARTICIPATION LIMITATIONS: meal prep, cleaning, laundry, driving, shopping, and community activity  PERSONAL FACTORS: Time since onset of injury/illness/exacerbation and 3+ comorbidities: History skin cancer, liver transplant, s/p ORIF Pelvic fx 11/12/19, s/p IMN L distal femur 08/12/2019, s/p L IM nailing femur 11/02/20, L foot drop, lumbar DDD, recent eye surgery  are also affecting patient's functional outcome.   REHAB POTENTIAL: Good  CLINICAL DECISION MAKING: Evolving/moderate complexity  EVALUATION COMPLEXITY:  Moderate   GOALS: Goals reviewed with patient? Yes  SHORT TERM GOALS: Target date: 08/03/2022   Patient will be independent with initial HEP. Baseline:  Goal status: MET compliant and I   LONG TERM GOALS: Target date: 09/14/2022   Patient will be independent with advanced/ongoing HEP to improve outcomes and carryover.  Baseline:  Goal status: IN PROGRESS  2.  Patient will report at least 50% improvement in left knee pain to improve QOL. Baseline: sharp pain with all knee movement Goal status: IN  PROGRESS  3.  Patient will report 75% improvement in L hip pain to improve QOL. Baseline: constant severe pain Goal status: IN PROGRESS  4. Patient will be able to ascend/descend 1 flight of stairs with 1 HR without increased L hip pain to access home.  Baseline: L hip pain increases on stairs to point interferes with sleep Goal status: IN PROGRESS  5.  Patient will report at least 9 points improvement on LEFS to demonstrate improved functional ability. Baseline: 16/80 Goal status: IN PROGRESS  PLAN:  PT FREQUENCY: 1-2x/week  PT DURATION: 8 weeks  PLANNED INTERVENTIONS: Therapeutic exercises, Therapeutic activity, Neuromuscular re-education, Balance training, Gait training, Patient/Family education, Self Care, Joint mobilization, Joint manipulation, Stair training, Orthotic/Fit training, Dry Needling, Electrical stimulation, Spinal manipulation, Spinal mobilization, Cryotherapy, Moist heat, scar mobilization, Taping, Traction, Ultrasound, Ionotophoresis 4mg /ml Dexamethasone, Manual therapy, and Re-evaluation  PLAN FOR NEXT SESSION:  Hip & knee strengthening, manual therapy to L hip and knee, Iontophoresis to knee   Darleene Cleaver, PTA 08/23/2022, 3:36 PM

## 2022-09-07 ENCOUNTER — Ambulatory Visit: Payer: No Typology Code available for payment source | Attending: Emergency Medicine

## 2022-09-07 DIAGNOSIS — G8929 Other chronic pain: Secondary | ICD-10-CM | POA: Insufficient documentation

## 2022-09-07 DIAGNOSIS — M25552 Pain in left hip: Secondary | ICD-10-CM | POA: Insufficient documentation

## 2022-09-07 DIAGNOSIS — M5442 Lumbago with sciatica, left side: Secondary | ICD-10-CM | POA: Insufficient documentation

## 2022-09-07 DIAGNOSIS — M6281 Muscle weakness (generalized): Secondary | ICD-10-CM | POA: Insufficient documentation

## 2022-09-07 DIAGNOSIS — R2681 Unsteadiness on feet: Secondary | ICD-10-CM | POA: Diagnosis present

## 2022-09-07 DIAGNOSIS — M25562 Pain in left knee: Secondary | ICD-10-CM | POA: Diagnosis present

## 2022-09-07 DIAGNOSIS — R262 Difficulty in walking, not elsewhere classified: Secondary | ICD-10-CM | POA: Diagnosis present

## 2022-09-07 DIAGNOSIS — R2689 Other abnormalities of gait and mobility: Secondary | ICD-10-CM | POA: Diagnosis present

## 2022-09-07 NOTE — Therapy (Signed)
OUTPATIENT PHYSICAL THERAPY TREATMENT   Patient Name: Andrew Banks MRN: [id number] DOB:06-12-1950, 72 y.o., male Today's Date: 09/07/2022  END OF SESSION:  PT End of Session - 09/07/22 1613     Visit Number 4    Number of Visits 15    Date for PT Re-Evaluation 09/14/22    Authorization Type VA, [employer]    Authorization Time Period 15 visits approved through [place], [place] 6/19-8/14    Authorization - Visit Number 4    Authorization - Number of Visits 10    PT Start Time 1535    PT Stop Time 1613    PT Time Calculation (min) 38 min    Activity Tolerance Patient tolerated treatment well    Behavior During Therapy WFL for tasks assessed/performed               Past Medical History:  Diagnosis Date   Arthritis    Degenerative disc disease, lumbar    Hypertension    Liver transplanted [facility])    Skin cancer    Past Surgical History:  Procedure Laterality Date   liver transplant     There are no problems to display for this patient.   PCP: Mayra Neer, MD   REFERRING PROVIDER: Clois Comber, PA-C   REFERRING DIAG: [phone] Pain in L knee  THERAPY DIAG:  Chronic pain of left knee  Muscle weakness (generalized)  Difficulty in walking, not elsewhere classified  Rationale for Evaluation and Treatment: Rehabilitation  ONSET DATE: 04/19/2022 acute onset L knee pain  SUBJECTIVE:   SUBJECTIVE STATEMENT:  Pt reports less pain today.  PERTINENT HISTORY: History skin cancer, liver transplant, s/p ORIF Pelvic fx 11/12/19, s/p IMN L distal femur 08/12/2019, s/p L IM nailing femur 11/02/20, L foot drop, lumbar DDD,  PAIN:  Are you having pain? Yes: NPRS scale: 4/10 Pain location: L knee  Pain description: sharp Aggravating factors: bending knee, cold Relieving factors: PT    PRECAUTIONS: Fall  WEIGHT BEARING RESTRICTIONS: No  FALLS:  Has patient fallen in last 6 months? No  LIVING ENVIRONMENT: Lives with: lives with their spouse Lives in:  House/apartment Stairs: Yes: Internal: 10 steps; on left going up and External: 17 steps; on right going up, on left going up, and can reach both Has following equipment at home: Single point cane, Walker - 4 wheeled, Wheelchair (manual), Tour manager, and Grab bars  OCCUPATION: retired  PLOF: Independent with household mobility with device  PATIENT GOALS: decrease pain  NEXT MD VISIT: to be scheduled  OBJECTIVE:   DIAGNOSTIC FINDINGS:  XR 12/22/20: 1.  Healing unstable traumatic U-shaped sacral fracture (spinopelvic dissociation) status post internal fixation with bilateral iliosacral screws at S1 and A left transsacral transiliac screw at S2. Both hips are located. No hardware complication. No gross change in alignment.   2.  Healed left ischiopubic ramus fracture.   3.  Healing peri-implant pertrochanteric left femoral fracture status post cephalomedullary nail fixation. No hardware is intact without loosening. No change in fracture alignment.   4.  Healed bicondylar distal femoral fracture (extending proximally to the distal third diaphysis) status post ORIF with lateral buttress plate and screws. No hardware complication or change in fracture alignment.   5.  Medial patellar fracture is is not visible on this examination and seen on the CT left knee dated 11/11/2019.   6.  Healed nondisplaced fibular neck fracture.   7.  No new fractures.   8.  No joint misalignment (both sacroiliac, pubic symphysis,  and both hips, and left knee).   9.  Status post embolization of multiple arteries (left L3 and L4 lumbar, right L4 lumbar, 2 branches of the left deep circumflex iliac artery, and bilateral iliolumbar arteries (per Interventional Radiology procedure note dated 11/10/2020) with multiple embolization coils in and around the anatomic pelvis. Vascular plug in the right groin.   10.  Status post ACL reconstruction. Femoral interference screw and partially imaged tibial screw are intact without loosening.    11.  Osteopenia.   12.  Moderate degenerative changes of the knee.   13.  Mild degenerative changes of the hips and pubic symphysis.   14.  Degenerative changes of the partially imaged lumbosacral spine.   15.  Partially imaged mesh tacks in the abdomen (along the ventral abdominal wall when correlating with prior CT abdomen/pelvis dated 11/16/2019).     PATIENT SURVEYS:  LEFS 16/80 =20% ability   COGNITION: Overall cognitive status: Within functional limits for tasks assessed     SENSATION: Impaired sensation L foot/fibular nerve distribution   MUSCLE LENGTH: Hamstrings: moderate tightness L Piriformis- significant tightness L  POSTURE: rounded shoulders, forward head, and decreased lumbar lordosis  PALPATION: Tenderness L patellar tendon, L pes anserine, tightness L Itband, crepitus and grinding when extends/flexes knee.  Tenderness in L glut medius and piriformis.    LOWER EXTREMITY ROM: L hip ROM limited by pain, increased L knee pain with all L hip movement today, very limited L hip internal rotation, external rotation ~ 45 deg.   LOWER EXTREMITY MMT:  MMT Right eval Left eval  Hip flexion 4+p! 4+p!  Hip extension 5 4+  Hip abduction 5 5  Hip adduction 5 5  Hip internal rotation    Hip external rotation    Knee flexion 5 5p!  Knee extension 5 5p!  Ankle dorsiflexion 5 3+  Ankle plantarflexion 3 3  Ankle inversion    Ankle eversion  2   (Blank rows = not tested)  FUNCTIONAL TESTS:  5 times sit to stand: 12.28 seconds without UE assist.   GAIT: Distance walked: 50' Assistive device utilized: Environmental consultant - 4 wheeled Level of assistance: Modified independence Comments: L knee genu varus, unsteady without walker, wide BOS, visually slow gait speed   TODAY'S TREATMENT:                                                                                                                              DATE:  09/07/2022 Therapeutic Exercise: to improve strength and mobility.   Demo, verbal and tactile cues throughout for technique. Nustep L5x2min Clock balance 4x R/L 1/2 circle with SPC Kicking cone over then picking back up with LE x 5 bil Kicking green small ball x 5 with SPC Gait Training with SPC, walking throughout the room ~88ft  Knee flexion 20# 2x10 Knee extension 10# x 10  08/23/2022 Therapeutic Exercise: to improve strength and mobility.  Demo, verbal and tactile cues throughout  for technique. Nustep L5x61min Bridges 3x10 SLR 2# 2x10 bil Sit to stands x 20  Bathroom break for patient Knee flexion 20# 2x10 Knee extension 10# 2x10  08/18/2022 Therapeutic Exercise: to improve strength and mobility.  Demo, verbal and tactile cues throughout for technique. SLR 2 x 10 each side 2#  Bridges 2 x 10  Clams 2 x 10 each side Prone leg extension 2 x 10 - 2# on right side, 0# on left Manual Therapy: to decrease muscle spasm and pain and improve mobility STM/TPR to peroneals, mobs to ankle mortise joint, plantar stretches, 1st ray mobs, skilled palpation and monitoring during dry needling. Trigger Point Dry-Needling  Treatment instructions: Expect mild to moderate muscle soreness. S/S of pneumothorax if dry needled over a lung field, and to seek immediate medical attention should they occur. Patient verbalized understanding of these instructions and education. Patient Consent Given: Yes Education handout provided: Previously provided Muscles treated: L fibularis muscles Electrical stimulation performed: No Parameters: N/A Treatment response/outcome: Twitch Response Elicited and Palpable Increase in Muscle Length   07/20/2022 Manual Therapy: to decrease muscle spasm and pain and improve mobility STM/TPR to L glutes, piriformis, cross friction to L Itband, skilled palpation and monitoring during dry needling. Trigger Point Dry-Needling  Treatment instructions: Expect mild to moderate muscle soreness. S/S of pneumothorax if dry needled over a lung field, and to  seek immediate medical attention should they occur. Patient verbalized understanding of these instructions and education. Patient Consent Given: Yes Education handout provided: Previously provided Muscles treated: L gluteus medius, L piriformis, L distal quad lateralis and lateral hamstring around Itband.  Treatment response/outcome: Twitch Response Elicited and Palpable Increase in Muscle Length     PATIENT EDUCATION:  Education details: findings, POC, TrDN Person educated: Patient Education method: Explanation Education comprehension: verbalized understanding  HOME EXERCISE PROGRAM: Access Code: [email or web address] URL: [email or web address] Date: 08/18/2022 Prepared by: Harrie Foreman  Exercises - Straight Leg Raise with Ankle Weight  - 1 x daily - 7 x weekly - 3 sets - 10 reps - Prone Hip Extension with Foot in Dorsiflexion and Ankle Weight  - 1 x daily - 7 x weekly - 3 sets - 10 reps - Clamshell  - 1 x daily - 7 x weekly - 3 sets - 10 reps - Supine Bridge  - 1 x daily - 7 x weekly - 3 sets - 10 reps  ASSESSMENT:  CLINICAL IMPRESSION: Continued with progressing balance activities. Pt showed some balance deficits with having to balance on one LE when kicking ball and cone today. He reports being able to navigate stairs better now but still used HR and step to pattern. Coming up on re-eval date next week, would continue to benefit from skilled therapy. [name] continues to demonstrate potential for improvement and would benefit from continued skilled therapy to address impairments.        OBJECTIVE IMPAIRMENTS: Abnormal gait, decreased activity tolerance, decreased balance, decreased endurance, decreased mobility, difficulty walking, decreased ROM, decreased strength, increased fascial restrictions, increased muscle spasms, impaired sensation, and pain.   ACTIVITY LIMITATIONS: carrying, lifting, bending, sitting, standing, squatting, sleeping, stairs, transfers, and  locomotion level  PARTICIPATION LIMITATIONS: meal prep, cleaning, laundry, driving, shopping, and community activity  PERSONAL FACTORS: Time since onset of injury/illness/exacerbation and 3+ comorbidities: History skin cancer, liver transplant, s/p ORIF Pelvic fx 11/12/19, s/p IMN L distal femur 08/12/2019, s/p L IM nailing femur 11/02/20, L foot drop, lumbar DDD, recent eye surgery  are also affecting patient's functional outcome.  REHAB POTENTIAL: Good  CLINICAL DECISION MAKING: Evolving/moderate complexity  EVALUATION COMPLEXITY: Moderate   GOALS: Goals reviewed with patient? Yes  SHORT TERM GOALS: Target date: 08/03/2022   Patient will be independent with initial HEP. Baseline:  Goal status: MET compliant and I   LONG TERM GOALS: Target date: 09/14/2022   Patient will be independent with advanced/ongoing HEP to improve outcomes and carryover.  Baseline:  Goal status: IN PROGRESS  2.  Patient will report at least 50% improvement in left knee pain to improve QOL. Baseline: sharp pain with all knee movement Goal status: IN PROGRESS  3.  Patient will report 75% improvement in L hip pain to improve QOL. Baseline: constant severe pain Goal status: IN PROGRESS  4. Patient will be able to ascend/descend 1 flight of stairs with 1 HR without increased L hip pain to access home.  Baseline: L hip pain increases on stairs to point interferes with sleep Goal status: IN PROGRESS- 09/07/22 does well with stairs, uses HR did not report having pain with this.  5.  Patient will report at least 9 points improvement on LEFS to demonstrate improved functional ability. Baseline: 16/80 Goal status: IN PROGRESS  PLAN:  PT FREQUENCY: 1-2x/week  PT DURATION: 8 weeks  PLANNED INTERVENTIONS: Therapeutic exercises, Therapeutic activity, Neuromuscular re-education, Balance training, Gait training, Patient/Family education, Self Care, Joint mobilization, Joint manipulation, Stair training,  Orthotic/Fit training, Dry Needling, Electrical stimulation, Spinal manipulation, Spinal mobilization, Cryotherapy, Moist heat, scar mobilization, Taping, Traction, Ultrasound, Ionotophoresis 4mg /ml Dexamethasone, Manual therapy, and Re-evaluation  PLAN FOR NEXT SESSION:  Hip & knee strengthening, manual therapy to L hip and knee, Iontophoresis to knee   Darleene Cleaver, PTA 09/07/2022, 5:19 PM

## 2022-09-15 ENCOUNTER — Encounter: Payer: Self-pay | Admitting: Physical Therapy

## 2022-09-15 ENCOUNTER — Ambulatory Visit: Payer: No Typology Code available for payment source | Admitting: Physical Therapy

## 2022-09-15 DIAGNOSIS — M25562 Pain in left knee: Secondary | ICD-10-CM | POA: Diagnosis not present

## 2022-09-15 DIAGNOSIS — R262 Difficulty in walking, not elsewhere classified: Secondary | ICD-10-CM

## 2022-09-15 DIAGNOSIS — R2681 Unsteadiness on feet: Secondary | ICD-10-CM

## 2022-09-15 DIAGNOSIS — M6281 Muscle weakness (generalized): Secondary | ICD-10-CM

## 2022-09-15 DIAGNOSIS — M25552 Pain in left hip: Secondary | ICD-10-CM

## 2022-09-15 DIAGNOSIS — G8929 Other chronic pain: Secondary | ICD-10-CM

## 2022-09-15 NOTE — Therapy (Signed)
OUTPATIENT PHYSICAL THERAPY TREATMENT/Recertification   Patient Name: Andrew Banks MRN: [id number] DOB:Jul 25, 1950, 72 y.o., male Today's Date: 09/15/2022  END OF SESSION:  PT End of Session - 09/15/22 1545     Visit Number 5    Number of Visits 15    Date for PT Re-Evaluation 12/08/22    Authorization Type [employer], [employer]    Authorization Time Period 15 visits approved through [place], [place] 6/19-8/14    Authorization - Number of Visits 10    PT Start Time 1537    PT Stop Time 1625    PT Time Calculation (min) 48 min    Activity Tolerance Patient tolerated treatment well    Behavior During Therapy WFL for tasks assessed/performed               Past Medical History:  Diagnosis Date   Arthritis    Degenerative disc disease, lumbar    Hypertension    Liver transplanted [facility])    Skin cancer    Past Surgical History:  Procedure Laterality Date   liver transplant     There are no problems to display for this patient.   PCP: Mayra Neer, MD   REFERRING PROVIDER: Clois Comber, PA-C   REFERRING DIAG: [phone] Pain in L knee  THERAPY DIAG:  Chronic pain of left knee - Plan: PT plan of care cert/re-cert  Muscle weakness (generalized) - Plan: PT plan of care cert/re-cert  Difficulty in walking, not elsewhere classified - Plan: PT plan of care cert/re-cert  Pain in left hip - Plan: PT plan of care cert/re-cert  Unsteadiness on feet - Plan: PT plan of care cert/re-cert  Rationale for Evaluation and Treatment: Rehabilitation  ONSET DATE: 04/19/2022 acute onset L knee pain  SUBJECTIVE:   SUBJECTIVE STATEMENT:  Now getting pain on medial L knee as well and across knee cap.  Started wearing sneaker instead of sandals to give foot more support.  He is getting assessments set up at [facility] to get assessed by orthopedist, PT to get to orthotist/prosthetist to get appropriate bracing for L foot.  Consistent with exercises, got ankle weights and using at home, really focusing  on L ankle control.       PERTINENT HISTORY: History skin cancer, liver transplant, s/p ORIF Pelvic fx 11/12/19, s/p IMN L distal femur 08/12/2019, s/p L IM nailing femur 11/02/20, L foot drop, lumbar DDD,  PAIN:  Are you having pain? Yes: NPRS scale: 4/10 Pain location: L knee  Pain description: sharp Aggravating factors: bending knee, cold Relieving factors: PT    PRECAUTIONS: Fall  WEIGHT BEARING RESTRICTIONS: No  FALLS:  Has patient fallen in last 6 months? No  LIVING ENVIRONMENT: Lives with: lives with their spouse Lives in: House/apartment Stairs: Yes: Internal: 10 steps; on left going up and External: 17 steps; on right going up, on left going up, and can reach both Has following equipment at home: Single point cane, Walker - 4 wheeled, Wheelchair (manual), Tour manager, and Grab bars  OCCUPATION: retired  PLOF: Independent with household mobility with device  PATIENT GOALS: decrease pain  NEXT MD VISIT: to be scheduled  OBJECTIVE:   DIAGNOSTIC FINDINGS:  XR 12/22/20: 1.  Healing unstable traumatic U-shaped sacral fracture (spinopelvic dissociation) status post internal fixation with bilateral iliosacral screws at S1 and A left transsacral transiliac screw at S2. Both hips are located. No hardware complication. No gross change in alignment.   2.  Healed left ischiopubic ramus fracture.   3.  Healing  peri-implant pertrochanteric left femoral fracture status post cephalomedullary nail fixation. No hardware is intact without loosening. No change in fracture alignment.   4.  Healed bicondylar distal femoral fracture (extending proximally to the distal third diaphysis) status post ORIF with lateral buttress plate and screws. No hardware complication or change in fracture alignment.   5.  Medial patellar fracture is is not visible on this examination and seen on the CT left knee dated 11/11/2019.   6.  Healed nondisplaced fibular neck fracture.   7.  No new fractures.   8.  No  joint misalignment (both sacroiliac, pubic symphysis, and both hips, and left knee).   9.  Status post embolization of multiple arteries (left L3 and L4 lumbar, right L4 lumbar, 2 branches of the left deep circumflex iliac artery, and bilateral iliolumbar arteries (per Interventional Radiology procedure note dated 11/10/2020) with multiple embolization coils in and around the anatomic pelvis. Vascular plug in the right groin.   10.  Status post ACL reconstruction. Femoral interference screw and partially imaged tibial screw are intact without loosening.   11.  Osteopenia.   12.  Moderate degenerative changes of the knee.   13.  Mild degenerative changes of the hips and pubic symphysis.   14.  Degenerative changes of the partially imaged lumbosacral spine.   15.  Partially imaged mesh tacks in the abdomen (along the ventral abdominal wall when correlating with prior CT abdomen/pelvis dated 11/16/2019).     PATIENT SURVEYS:  LEFS 16/80 =20% ability   COGNITION: Overall cognitive status: Within functional limits for tasks assessed     SENSATION: Impaired sensation L foot/fibular nerve distribution   MUSCLE LENGTH: Hamstrings: moderate tightness L Piriformis- significant tightness L  POSTURE: rounded shoulders, forward head, and decreased lumbar lordosis  PALPATION: Tenderness L patellar tendon, L pes anserine, tightness L Itband, crepitus and grinding when extends/flexes knee.  Tenderness in L glut medius and piriformis.    LOWER EXTREMITY ROM: L hip ROM limited by pain, increased L knee pain with all L hip movement today, very limited L hip internal rotation, external rotation ~ 45 deg.   LOWER EXTREMITY MMT:  MMT Right eval Left eval  Hip flexion 4+p! 4+p!  Hip extension 5 4+  Hip abduction 5 5  Hip adduction 5 5  Hip internal rotation    Hip external rotation    Knee flexion 5 5p!  Knee extension 5 5p!  Ankle dorsiflexion 5 3+  Ankle plantarflexion 3 3  Ankle inversion     Ankle eversion  2   (Blank rows = not tested)  FUNCTIONAL TESTS:  5 times sit to stand: 12.28 seconds without UE assist.   GAIT: Distance walked: 50' Assistive device utilized: Environmental consultant - 4 wheeled Level of assistance: Modified independence Comments: L knee genu varus, unsteady without walker, wide BOS, visually slow gait speed   TODAY'S TREATMENT:  DATE:   09/15/2022 Therapeutic Exercise: to improve strength and mobility.  Demo, verbal and tactile cues throughout for technique. Nustep L5 x 6 min Review of HEP Manual Therapy: to decrease muscle spasm and pain and improve mobility STM/TPR to L ITBand, hamstrings, cross friction patellar ligatment, mobs to L ankle, skilled palpation and monitoring during dry needling. Trigger Point Dry-Needling  Treatment instructions: Expect mild to moderate muscle soreness. S/S of pneumothorax if dry needled over a lung field, and to seek immediate medical attention should they occur. Patient verbalized understanding of these instructions and education. Patient Consent Given: Yes Education handout provided: Previously provided Muscles treated: L vastus lateralis and L lateral hamstring Electrical stimulation performed: No Parameters: N/A Treatment response/outcome: Twitch Response Elicited and Palpable Increase in Muscle Length   Iontophoresis: applied 4 hour patch with 1 ml dexamethasone (4g/ml) to L medial knee after education on precautions, indications, and how and when to remove patch.  Therapeutic Activity:  review of goals and concerns, plan of care.   09/07/2022 Therapeutic Exercise: to improve strength and mobility.  Demo, verbal and tactile cues throughout for technique. Nustep L5x75min Clock balance 4x R/L 1/2 circle with SPC Kicking cone over then picking back up with LE x 5 bil Kicking green small ball x 5 with  SPC Gait Training with SPC, walking throughout the room ~54ft  Knee flexion 20# 2x10 Knee extension 10# x 10  08/23/2022 Therapeutic Exercise: to improve strength and mobility.  Demo, verbal and tactile cues throughout for technique. Nustep L5x61min Bridges 3x10 SLR 2# 2x10 bil Sit to stands x 20  Bathroom break for patient Knee flexion 20# 2x10 Knee extension 10# 2x10    PATIENT EDUCATION:  Education details: iontophoresis Person educated: Patient Education method: Chief Technology Officer Education comprehension: verbalized understanding  HOME EXERCISE PROGRAM: Access Code: [email or web address] URL: [email or web address] Date: 08/18/2022 Prepared by: Harrie Foreman  Exercises - Straight Leg Raise with Ankle Weight  - 1 x daily - 7 x weekly - 3 sets - 10 reps - Prone Hip Extension with Foot in Dorsiflexion and Ankle Weight  - 1 x daily - 7 x weekly - 3 sets - 10 reps - Clamshell  - 1 x daily - 7 x weekly - 3 sets - 10 reps - Supine Bridge  - 1 x daily - 7 x weekly - 3 sets - 10 reps  ASSESSMENT:  CLINICAL IMPRESSION: Brodyn Royce reports he has significant improvement in L hip pain, but continues to have L knee pain, now having medial knee pain and patellar pain as well.  He is planning on going to be evaluated through [place] by orthopedist and PT in order to get appropriate orthotics referral to prosthetics for bracing for L foot/ankle, but has been very consistent with HEP for knee and ankle which has been helping, noted significant improvement in ankle mobility and ROM today compared to previous sessions.  He has also been working on his Itband to try too loosen pull on lateral knee.  Today trialed iontophoresis patch to his medial knee after skin inspection and instructions about wear time and removal, he verbalized understanding.   [name] continues to demonstrate potential for improvement and would benefit from continued skilled therapy to address impairments.   Plan to continue therapy 1x/week over 12 weeks  (10 visits total) to continue working not only on pain, strengthening, but gait and balance deficits as well as he continues to be at high risk for falls.  OBJECTIVE IMPAIRMENTS: Abnormal gait, decreased activity tolerance, decreased balance, decreased endurance, decreased mobility, difficulty walking, decreased ROM, decreased strength, increased fascial restrictions, increased muscle spasms, impaired sensation, and pain.   ACTIVITY LIMITATIONS: carrying, lifting, bending, sitting, standing, squatting, sleeping, stairs, transfers, and locomotion level  PARTICIPATION LIMITATIONS: meal prep, cleaning, laundry, driving, shopping, and community activity  PERSONAL FACTORS: Time since onset of injury/illness/exacerbation and 3+ comorbidities: History skin cancer, liver transplant, s/p ORIF Pelvic fx 11/12/19, s/p IMN L distal femur 08/12/2019, s/p L IM nailing femur 11/02/20, L foot drop, lumbar DDD, recent eye surgery  are also affecting patient's functional outcome.   REHAB POTENTIAL: Good  CLINICAL DECISION MAKING: Evolving/moderate complexity  EVALUATION COMPLEXITY: Moderate   GOALS: Goals reviewed with patient? Yes  SHORT TERM GOALS: Target date: 08/03/2022   Patient will be independent with initial HEP. Baseline:  Goal status: MET compliant and I   LONG TERM GOALS: Target date: 09/14/2022 extended through 12/08/2022  Patient will be independent with advanced/ongoing HEP to improve outcomes and carryover.  Baseline:  Goal status: IN PROGRESS 09/15/22- met for current  2.  Patient will report at least 50% improvement in left knee pain to improve QOL. Baseline: sharp pain with all knee movement Goal status: IN PROGRESS 09/15/22- continues to have severe L knee pain, will set up appt. With orthopedist.   3.  Patient will report 75% improvement in L hip pain to improve QOL. Baseline: constant severe pain Goal status: MET  09/15/2022 L  hip significant improvement in pain.   Pain now in L SIJ  4. Patient will be able to ascend/descend 1 flight of stairs with 1 HR without increased L hip pain to access home.  Baseline: L hip pain increases on stairs to point interferes with sleep Goal status: IN PROGRESS- 09/07/22 does well with stairs, uses HR did not report having pain with this.  5.  Patient will report at least 9 points improvement on LEFS to demonstrate improved functional ability. Baseline: 16/80 Goal status: IN PROGRESS   PLAN:  PT FREQUENCY: 1x/week  PT DURATION: 12 weeks for 10 visits  PLANNED INTERVENTIONS: Therapeutic exercises, Therapeutic activity, Neuromuscular re-education, Balance training, Gait training, Patient/Family education, Self Care, Joint mobilization, Joint manipulation, Stair training, Orthotic/Fit training, Dry Needling, Electrical stimulation, Spinal manipulation, Spinal mobilization, Cryotherapy, Moist heat, scar mobilization, Taping, Traction, Ultrasound, Ionotophoresis 4mg /ml Dexamethasone, Manual therapy, and Re-evaluation  PLAN FOR NEXT SESSION:  Hip & knee strengthening, manual therapy to L hip and knee, Iontophoresis to knee, balance training   Jena Gauss, PT, DPT 09/15/2022, 4:55 PM

## 2022-09-15 NOTE — Patient Instructions (Signed)
° ° °  IONTOPHORESIS PATIENT PRECAUTIONS & CONTRAINDICATIONS:  Redness under one or both electrodes can occur.  This characterized by a uniform redness that usually disappears within 12 hours of treatment. Small pinhead size blisters may result in response to the drug.  Contact your physician if the problem persists more than 24 hours. On rare occasions, iontophoresis therapy can result in temporary skin reactions such as rash, inflammation, irritation or burns.  The skin reactions may be the result of individual sensitivity to the ionic solution used, the condition of the skin at the start of treatment, reaction to the materials in the electrodes, allergies or sensitivity to dexamethasone, or a poor connection between the patch and your skin.  Discontinue using iontophoresis if you have any of these reactions and report to your therapist. Remove the Patch or electrodes if you have any undue sensation of pain or burning during the treatment and report discomfort to your therapist. Tell your Therapist if you have had known adverse reactions to the application of electrical current. Approximate treatment time is 4-6 hours.  Remove the patch after 6 hours. The Patch can be worn during normal activity, however excessive motion where the electrodes have been placed can cause poor contact between the skin and the electrode or uneven electrical current resulting in greater risk of skin irritation. Keep out of the reach of children.   DO NOT use if you have a cardiac pacemaker or any other electrically sensitive implanted device. DO NOT use if you have a known sensitivity to dexamethasone. DO NOT use during Magnetic Resonance Imaging (MRI). DO NOT use over broken or compromised skin (e.g. sunburn, cuts, or acne) due to the increased risk of skin reaction. DO NOT SHAVE over the area to be treated:  To establish good contact between the Patch and the skin, excessive hair may be clipped. DO NOT place the  Patch or electrodes on or over your eyes, directly over your heart, or brain. DO NOT reuse the Patch or electrodes as this may cause burns to occur.   For questions, please contact your therapist at:  [facility] [place] [address]  [address] [place], [place], [address] Phone: [phone]   Fax:  [phone]  

## 2022-09-27 ENCOUNTER — Ambulatory Visit: Payer: No Typology Code available for payment source

## 2022-09-27 DIAGNOSIS — M25562 Pain in left knee: Secondary | ICD-10-CM | POA: Diagnosis not present

## 2022-09-27 DIAGNOSIS — R2689 Other abnormalities of gait and mobility: Secondary | ICD-10-CM

## 2022-09-27 DIAGNOSIS — G8929 Other chronic pain: Secondary | ICD-10-CM

## 2022-09-27 DIAGNOSIS — R262 Difficulty in walking, not elsewhere classified: Secondary | ICD-10-CM

## 2022-09-27 DIAGNOSIS — R2681 Unsteadiness on feet: Secondary | ICD-10-CM

## 2022-09-27 DIAGNOSIS — M25552 Pain in left hip: Secondary | ICD-10-CM

## 2022-09-27 DIAGNOSIS — M6281 Muscle weakness (generalized): Secondary | ICD-10-CM

## 2022-09-27 NOTE — Therapy (Signed)
OUTPATIENT PHYSICAL THERAPY TREATMENT/Recertification   Patient Name: Andrew Banks MRN: [id number] DOB:March 07, 1950, 72 y.o., male Today's Date: 09/27/2022  END OF SESSION:  PT End of Session - 09/27/22 1507     Visit Number 6    Number of Visits 15    Date for PT Re-Evaluation 12/08/22    Authorization Type [employer], [employer]    Authorization Time Period 15 visits approved through [place], [place] 09/15/22- 12/08/22    Authorization - Visit Number 2    Authorization - Number of Visits 10    PT Start Time 1450    PT Stop Time 1532    PT Time Calculation (min) 42 min    Activity Tolerance Patient tolerated treatment well    Behavior During Therapy WFL for tasks assessed/performed                Past Medical History:  Diagnosis Date   Arthritis    Degenerative disc disease, lumbar    Hypertension    Liver transplanted [facility])    Skin cancer    Past Surgical History:  Procedure Laterality Date   liver transplant     There are no problems to display for this patient.   PCP: Mayra Neer, MD   REFERRING PROVIDER: Clois Comber, PA-C   REFERRING DIAG: [phone] Pain in L knee  THERAPY DIAG:  Chronic pain of left knee  Muscle weakness (generalized)  Difficulty in walking, not elsewhere classified  Pain in left hip  Unsteadiness on feet  Chronic left-sided low back pain with left-sided sciatica  Other abnormalities of gait and mobility  Rationale for Evaluation and Treatment: Rehabilitation  ONSET DATE: 04/19/2022 acute onset L knee pain  SUBJECTIVE:   SUBJECTIVE STATEMENT:  No       PERTINENT HISTORY: History skin cancer, liver transplant, s/p ORIF Pelvic fx 11/12/19, s/p IMN L distal femur 08/12/2019, s/p L IM nailing femur 11/02/20, L foot drop, lumbar DDD,  PAIN:  Are you having pain? Yes: NPRS scale: 5/10 Pain location: L knee  Pain description: sharp Aggravating factors: bending knee, cold Relieving factors: PT    PRECAUTIONS: Fall  WEIGHT  BEARING RESTRICTIONS: No  FALLS:  Has patient fallen in last 6 months? No  LIVING ENVIRONMENT: Lives with: lives with their spouse Lives in: House/apartment Stairs: Yes: Internal: 10 steps; on left going up and External: 17 steps; on right going up, on left going up, and can reach both Has following equipment at home: Single point cane, Walker - 4 wheeled, Wheelchair (manual), Tour manager, and Grab bars  OCCUPATION: retired  PLOF: Independent with household mobility with device  PATIENT GOALS: decrease pain  NEXT MD VISIT: to be scheduled  OBJECTIVE:   DIAGNOSTIC FINDINGS:  XR 12/22/20: 1.  Healing unstable traumatic U-shaped sacral fracture (spinopelvic dissociation) status post internal fixation with bilateral iliosacral screws at S1 and A left transsacral transiliac screw at S2. Both hips are located. No hardware complication. No gross change in alignment.   2.  Healed left ischiopubic ramus fracture.   3.  Healing peri-implant pertrochanteric left femoral fracture status post cephalomedullary nail fixation. No hardware is intact without loosening. No change in fracture alignment.   4.  Healed bicondylar distal femoral fracture (extending proximally to the distal third diaphysis) status post ORIF with lateral buttress plate and screws. No hardware complication or change in fracture alignment.   5.  Medial patellar fracture is is not visible on this examination and seen on the CT left knee dated  11/11/2019.   6.  Healed nondisplaced fibular neck fracture.   7.  No new fractures.   8.  No joint misalignment (both sacroiliac, pubic symphysis, and both hips, and left knee).   9.  Status post embolization of multiple arteries (left L3 and L4 lumbar, right L4 lumbar, 2 branches of the left deep circumflex iliac artery, and bilateral iliolumbar arteries (per Interventional Radiology procedure note dated 11/10/2020) with multiple embolization coils in and around the anatomic pelvis. Vascular plug  in the right groin.   10.  Status post ACL reconstruction. Femoral interference screw and partially imaged tibial screw are intact without loosening.   11.  Osteopenia.   12.  Moderate degenerative changes of the knee.   13.  Mild degenerative changes of the hips and pubic symphysis.   14.  Degenerative changes of the partially imaged lumbosacral spine.   15.  Partially imaged mesh tacks in the abdomen (along the ventral abdominal wall when correlating with prior CT abdomen/pelvis dated 11/16/2019).     PATIENT SURVEYS:  LEFS 16/80 =20% ability   COGNITION: Overall cognitive status: Within functional limits for tasks assessed     SENSATION: Impaired sensation L foot/fibular nerve distribution   MUSCLE LENGTH: Hamstrings: moderate tightness L Piriformis- significant tightness L  POSTURE: rounded shoulders, forward head, and decreased lumbar lordosis  PALPATION: Tenderness L patellar tendon, L pes anserine, tightness L Itband, crepitus and grinding when extends/flexes knee.  Tenderness in L glut medius and piriformis.    LOWER EXTREMITY ROM: L hip ROM limited by pain, increased L knee pain with all L hip movement today, very limited L hip internal rotation, external rotation ~ 45 deg.   LOWER EXTREMITY MMT:  MMT Right eval Left eval  Hip flexion 4+p! 4+p!  Hip extension 5 4+  Hip abduction 5 5  Hip adduction 5 5  Hip internal rotation    Hip external rotation    Knee flexion 5 5p!  Knee extension 5 5p!  Ankle dorsiflexion 5 3+  Ankle plantarflexion 3 3  Ankle inversion    Ankle eversion  2   (Blank rows = not tested)  FUNCTIONAL TESTS:  5 times sit to stand: 12.28 seconds without UE assist.   GAIT: Distance walked: 50' Assistive device utilized: Environmental consultant - 4 wheeled Level of assistance: Modified independence Comments: L knee genu varus, unsteady without walker, wide BOS, visually slow gait speed   TODAY'S TREATMENT:                                                                                                                               DATE:  09/27/22 Therapeutic Exercise: to improve strength and mobility.  Demo, verbal and tactile cues throughout for technique. Gait around clinic 2x90 ft with rollator for warm up LEFS: 32 / 80 = 40.0 % Nustep L6x58min  NEUROMUSCULAR RE-EDUCATION: To improve posture, balance, and kinesthesia. Church pews 2x10  Retro step LLE fwd/RLE back  2x10  Standing march with intermittent UE support x 12 bil LE Clock with colored dots 4x in 1/2 circle pattern BLE with chair for support  Iontophoresis: applied 4 hour patch with 1 ml dexamethasone (4g/ml) to L medial knee after education on precautions, indications, and how and when to remove patch. Patch #2/6  09/15/2022 Therapeutic Exercise: to improve strength and mobility.  Demo, verbal and tactile cues throughout for technique. Nustep L5 x 6 min Review of HEP Manual Therapy: to decrease muscle spasm and pain and improve mobility STM/TPR to L ITBand, hamstrings, cross friction patellar ligatment, mobs to L ankle, skilled palpation and monitoring during dry needling. Trigger Point Dry-Needling  Treatment instructions: Expect mild to moderate muscle soreness. S/S of pneumothorax if dry needled over a lung field, and to seek immediate medical attention should they occur. Patient verbalized understanding of these instructions and education. Patient Consent Given: Yes Education handout provided: Previously provided Muscles treated: L vastus lateralis and L lateral hamstring Electrical stimulation performed: No Parameters: N/A Treatment response/outcome: Twitch Response Elicited and Palpable Increase in Muscle Length   Iontophoresis: applied 4 hour patch with 1 ml dexamethasone (4g/ml) to L medial knee after education on precautions, indications, and how and when to remove patch.  Therapeutic Activity:  review of goals and concerns, plan of care.   09/07/2022 Therapeutic Exercise: to  improve strength and mobility.  Demo, verbal and tactile cues throughout for technique. Nustep L5x49min Clock balance 4x R/L 1/2 circle with SPC Kicking cone over then picking back up with LE x 5 bil Kicking green small ball x 5 with SPC Gait Training with SPC, walking throughout the room ~67ft  Knee flexion 20# 2x10 Knee extension 10# x 10  08/23/2022 Therapeutic Exercise: to improve strength and mobility.  Demo, verbal and tactile cues throughout for technique. Nustep L5x57min Bridges 3x10 SLR 2# 2x10 bil Sit to stands x 20  Bathroom break for patient Knee flexion 20# 2x10 Knee extension 10# 2x10    PATIENT EDUCATION:  Education details: iontophoresis Person educated: Patient Education method: Chief Technology Officer Education comprehension: verbalized understanding  HOME EXERCISE PROGRAM: Access Code: [email or web address] URL: [email or web address] Date: 08/18/2022 Prepared by: Harrie Foreman  Exercises - Straight Leg Raise with Ankle Weight  - 1 x daily - 7 x weekly - 3 sets - 10 reps - Prone Hip Extension with Foot in Dorsiflexion and Ankle Weight  - 1 x daily - 7 x weekly - 3 sets - 10 reps - Clamshell  - 1 x daily - 7 x weekly - 3 sets - 10 reps - Supine Bridge  - 1 x daily - 7 x weekly - 3 sets - 10 reps  ASSESSMENT:  CLINICAL IMPRESSION: Began session with gait and Nustep for warm up, along with reviewing LEFS with Mr. Newberg. He shows a 16 point improvement in LEFS score today. Most of session focused on balance activities, working on weight shifting and stepping w/o UE support. He demonstrates most unsteadiness with supporting himself only on his L LE. He noted improvement from ionto patch last visit, so we did another one today to the same area.  [name] continues to demonstrate potential for improvement and would benefit from continued skilled therapy to address impairments.        OBJECTIVE IMPAIRMENTS: Abnormal gait, decreased activity tolerance,  decreased balance, decreased endurance, decreased mobility, difficulty walking, decreased ROM, decreased strength, increased fascial restrictions, increased muscle spasms, impaired sensation, and pain.   ACTIVITY LIMITATIONS: carrying, lifting, bending,  sitting, standing, squatting, sleeping, stairs, transfers, and locomotion level  PARTICIPATION LIMITATIONS: meal prep, cleaning, laundry, driving, shopping, and community activity  PERSONAL FACTORS: Time since onset of injury/illness/exacerbation and 3+ comorbidities: History skin cancer, liver transplant, s/p ORIF Pelvic fx 11/12/19, s/p IMN L distal femur 08/12/2019, s/p L IM nailing femur 11/02/20, L foot drop, lumbar DDD, recent eye surgery  are also affecting patient's functional outcome.   REHAB POTENTIAL: Good  CLINICAL DECISION MAKING: Evolving/moderate complexity  EVALUATION COMPLEXITY: Moderate   GOALS: Goals reviewed with patient? Yes  SHORT TERM GOALS: Target date: 08/03/2022   Patient will be independent with initial HEP. Baseline:  Goal status: MET compliant and I   LONG TERM GOALS: Target date: 09/14/2022 extended through 12/08/2022  Patient will be independent with advanced/ongoing HEP to improve outcomes and carryover.  Baseline:  Goal status: IN PROGRESS 09/15/22- met for current  2.  Patient will report at least 50% improvement in left knee pain to improve QOL. Baseline: sharp pain with all knee movement Goal status: IN PROGRESS 09/15/22- continues to have severe L knee pain, will set up appt. With orthopedist.   3.  Patient will report 75% improvement in L hip pain to improve QOL. Baseline: constant severe pain Goal status: MET  09/15/2022 L hip significant improvement in pain.   Pain now in L SIJ  4. Patient will be able to ascend/descend 1 flight of stairs with 1 HR without increased L hip pain to access home.  Baseline: L hip pain increases on stairs to point interferes with sleep Goal status: IN PROGRESS- 09/07/22  does well with stairs, uses HR did not report having pain with this.  5.  Patient will report at least 9 points improvement on LEFS to demonstrate improved functional ability. Baseline: 16/80 Goal status: IN PROGRESS - 09/27/22 LEFS: 32 / 80 = 40.0 %  PLAN:  PT FREQUENCY: 1x/week  PT DURATION: 12 weeks for 10 visits  PLANNED INTERVENTIONS: Therapeutic exercises, Therapeutic activity, Neuromuscular re-education, Balance training, Gait training, Patient/Family education, Self Care, Joint mobilization, Joint manipulation, Stair training, Orthotic/Fit training, Dry Needling, Electrical stimulation, Spinal manipulation, Spinal mobilization, Cryotherapy, Moist heat, scar mobilization, Taping, Traction, Ultrasound, Ionotophoresis 4mg /ml Dexamethasone, Manual therapy, and Re-evaluation  PLAN FOR NEXT SESSION:  Hip & knee strengthening, manual therapy to L hip and knee, Iontophoresis to knee, balance training   Darleene Cleaver, PTA 09/27/2022, 4:01 PM

## 2022-10-12 ENCOUNTER — Ambulatory Visit: Payer: No Typology Code available for payment source | Admitting: Physical Therapy

## 2022-10-17 ENCOUNTER — Ambulatory Visit: Payer: No Typology Code available for payment source | Attending: Emergency Medicine

## 2022-10-17 DIAGNOSIS — R2681 Unsteadiness on feet: Secondary | ICD-10-CM | POA: Insufficient documentation

## 2022-10-17 DIAGNOSIS — R262 Difficulty in walking, not elsewhere classified: Secondary | ICD-10-CM | POA: Diagnosis present

## 2022-10-17 DIAGNOSIS — M25562 Pain in left knee: Secondary | ICD-10-CM | POA: Diagnosis present

## 2022-10-17 DIAGNOSIS — M25552 Pain in left hip: Secondary | ICD-10-CM | POA: Insufficient documentation

## 2022-10-17 DIAGNOSIS — M6281 Muscle weakness (generalized): Secondary | ICD-10-CM | POA: Insufficient documentation

## 2022-10-17 DIAGNOSIS — G8929 Other chronic pain: Secondary | ICD-10-CM | POA: Diagnosis present

## 2022-10-17 DIAGNOSIS — M5442 Lumbago with sciatica, left side: Secondary | ICD-10-CM | POA: Diagnosis present

## 2022-10-17 DIAGNOSIS — R2689 Other abnormalities of gait and mobility: Secondary | ICD-10-CM | POA: Insufficient documentation

## 2022-10-17 NOTE — Therapy (Signed)
OUTPATIENT PHYSICAL THERAPY TREATMENT   Patient Name: Andrew Banks MRN: [id number] DOB:01/01/1951, 72 y.o., male Today's Date: 10/17/2022  END OF SESSION:  PT End of Session - 10/17/22 1518     Visit Number 7    Number of Visits 15    Date for PT Re-Evaluation 12/08/22    Authorization Type [employer], [employer]    Authorization Time Period 15 visits approved through [place], [place] 09/15/22- 12/08/22    Authorization - Visit Number 3    Authorization - Number of Visits 10    PT Start Time 1447    PT Stop Time 1532    PT Time Calculation (min) 45 min    Activity Tolerance Patient tolerated treatment well    Behavior During Therapy WFL for tasks assessed/performed                 Past Medical History:  Diagnosis Date   Arthritis    Degenerative disc disease, lumbar    Hypertension    Liver transplanted [facility])    Skin cancer    Past Surgical History:  Procedure Laterality Date   liver transplant     There are no problems to display for this patient.   PCP: Mayra Neer, MD   REFERRING PROVIDER: Clois Comber, PA-C   REFERRING DIAG: [phone] Pain in L knee  THERAPY DIAG:  Chronic pain of left knee  Muscle weakness (generalized)  Difficulty in walking, not elsewhere classified  Pain in left hip  Unsteadiness on feet  Chronic left-sided low back pain with left-sided sciatica  Other abnormalities of gait and mobility  Rationale for Evaluation and Treatment: Rehabilitation  ONSET DATE: 04/19/2022 acute onset L knee pain  SUBJECTIVE:   SUBJECTIVE STATEMENT:  Pt reports worse pain   PERTINENT HISTORY: History skin cancer, liver transplant, s/p ORIF Pelvic fx 11/12/19, s/p IMN L distal femur 08/12/2019, s/p L IM nailing femur 11/02/20, L foot drop, lumbar DDD,  PAIN:  Are you having pain? Yes: NPRS scale: 5/10 Pain location: L knee  Pain description: sharp Aggravating factors: bending knee, cold Relieving factors: PT    PRECAUTIONS: Fall  WEIGHT  BEARING RESTRICTIONS: No  FALLS:  Has patient fallen in last 6 months? No  LIVING ENVIRONMENT: Lives with: lives with their spouse Lives in: House/apartment Stairs: Yes: Internal: 10 steps; on left going up and External: 17 steps; on right going up, on left going up, and can reach both Has following equipment at home: Single point cane, Walker - 4 wheeled, Wheelchair (manual), Tour manager, and Grab bars  OCCUPATION: retired  PLOF: Independent with household mobility with device  PATIENT GOALS: decrease pain  NEXT MD VISIT: to be scheduled  OBJECTIVE:   DIAGNOSTIC FINDINGS:  XR 12/22/20: 1.  Healing unstable traumatic U-shaped sacral fracture (spinopelvic dissociation) status post internal fixation with bilateral iliosacral screws at S1 and A left transsacral transiliac screw at S2. Both hips are located. No hardware complication. No gross change in alignment.   2.  Healed left ischiopubic ramus fracture.   3.  Healing peri-implant pertrochanteric left femoral fracture status post cephalomedullary nail fixation. No hardware is intact without loosening. No change in fracture alignment.   4.  Healed bicondylar distal femoral fracture (extending proximally to the distal third diaphysis) status post ORIF with lateral buttress plate and screws. No hardware complication or change in fracture alignment.   5.  Medial patellar fracture is is not visible on this examination and seen on the CT left knee dated  11/11/2019.   6.  Healed nondisplaced fibular neck fracture.   7.  No new fractures.   8.  No joint misalignment (both sacroiliac, pubic symphysis, and both hips, and left knee).   9.  Status post embolization of multiple arteries (left L3 and L4 lumbar, right L4 lumbar, 2 branches of the left deep circumflex iliac artery, and bilateral iliolumbar arteries (per Interventional Radiology procedure note dated 11/10/2020) with multiple embolization coils in and around the anatomic pelvis. Vascular plug  in the right groin.   10.  Status post ACL reconstruction. Femoral interference screw and partially imaged tibial screw are intact without loosening.   11.  Osteopenia.   12.  Moderate degenerative changes of the knee.   13.  Mild degenerative changes of the hips and pubic symphysis.   14.  Degenerative changes of the partially imaged lumbosacral spine.   15.  Partially imaged mesh tacks in the abdomen (along the ventral abdominal wall when correlating with prior CT abdomen/pelvis dated 11/16/2019).     PATIENT SURVEYS:  LEFS 16/80 =20% ability   COGNITION: Overall cognitive status: Within functional limits for tasks assessed     SENSATION: Impaired sensation L foot/fibular nerve distribution   MUSCLE LENGTH: Hamstrings: moderate tightness L Piriformis- significant tightness L  POSTURE: rounded shoulders, forward head, and decreased lumbar lordosis  PALPATION: Tenderness L patellar tendon, L pes anserine, tightness L Itband, crepitus and grinding when extends/flexes knee.  Tenderness in L glut medius and piriformis.    LOWER EXTREMITY ROM: L hip ROM limited by pain, increased L knee pain with all L hip movement today, very limited L hip internal rotation, external rotation ~ 45 deg.   LOWER EXTREMITY MMT:  MMT Right eval Left eval  Hip flexion 4+p! 4+p!  Hip extension 5 4+  Hip abduction 5 5  Hip adduction 5 5  Hip internal rotation    Hip external rotation    Knee flexion 5 5p!  Knee extension 5 5p!  Ankle dorsiflexion 5 3+  Ankle plantarflexion 3 3  Ankle inversion    Ankle eversion  2   (Blank rows = not tested)  FUNCTIONAL TESTS:  5 times sit to stand: 12.28 seconds without UE assist.   GAIT: Distance walked: 50' Assistive device utilized: Environmental consultant - 4 wheeled Level of assistance: Modified independence Comments: L knee genu varus, unsteady without walker, wide BOS, visually slow gait speed   TODAY'S TREATMENT:                                                                                                                               DATE:  10/17/22 Therapeutic Exercise: to improve strength and mobility.  Demo, verbal and tactile cues throughout for technique. Nustep L5x39min Retro step x 20 bil Mini squats at counter 10x3" Seated PF with blue TB 2x10  Standing B toe raises back to wall 2x10 Seated hip up and over x 10  bil  Iontophoresis: applied 4 hour patch with 1 ml dexamethasone (4g/ml) to L medial knee after education on precautions, indications, and how and when to remove patch. Patch #3/6  09/27/22 Therapeutic Exercise: to improve strength and mobility.  Demo, verbal and tactile cues throughout for technique. Gait around clinic 2x90 ft with rollator for warm up LEFS: 32 / 80 = 40.0 % Nustep L6x16min  NEUROMUSCULAR RE-EDUCATION: To improve posture, balance, and kinesthesia. Church pews 2x10  Retro step LLE fwd/RLE back 2x10  Standing march with intermittent UE support x 12 bil LE Clock with colored dots 4x in 1/2 circle pattern BLE with chair for support  Iontophoresis: applied 4 hour patch with 1 ml dexamethasone (4g/ml) to L medial knee after education on precautions, indications, and how and when to remove patch. Patch #2/6  09/15/2022 Therapeutic Exercise: to improve strength and mobility.  Demo, verbal and tactile cues throughout for technique. Nustep L5 x 6 min Review of HEP Manual Therapy: to decrease muscle spasm and pain and improve mobility STM/TPR to L ITBand, hamstrings, cross friction patellar ligatment, mobs to L ankle, skilled palpation and monitoring during dry needling. Trigger Point Dry-Needling  Treatment instructions: Expect mild to moderate muscle soreness. S/S of pneumothorax if dry needled over a lung field, and to seek immediate medical attention should they occur. Patient verbalized understanding of these instructions and education. Patient Consent Given: Yes Education handout provided: Previously  provided Muscles treated: L vastus lateralis and L lateral hamstring Electrical stimulation performed: No Parameters: N/A Treatment response/outcome: Twitch Response Elicited and Palpable Increase in Muscle Length   Iontophoresis: applied 4 hour patch with 1 ml dexamethasone (4g/ml) to L medial knee after education on precautions, indications, and how and when to remove patch.  Therapeutic Activity:  review of goals and concerns, plan of care.   09/07/2022 Therapeutic Exercise: to improve strength and mobility.  Demo, verbal and tactile cues throughout for technique. Nustep L5x16min Clock balance 4x R/L 1/2 circle with SPC Kicking cone over then picking back up with LE x 5 bil Kicking green small ball x 5 with SPC Gait Training with SPC, walking throughout the room ~4ft  Knee flexion 20# 2x10 Knee extension 10# x 10  08/23/2022 Therapeutic Exercise: to improve strength and mobility.  Demo, verbal and tactile cues throughout for technique. Nustep L5x61min Bridges 3x10 SLR 2# 2x10 bil Sit to stands x 20  Bathroom break for patient Knee flexion 20# 2x10 Knee extension 10# 2x10    PATIENT EDUCATION:  Education details: seated PF with TB Person educated: Patient Education method: Explanation and Handouts Education comprehension: verbalized understanding  HOME EXERCISE PROGRAM: Access Code: [email or web address] URL: [email or web address] Date: 08/18/2022 Prepared by: Harrie Foreman  Exercises - Straight Leg Raise with Ankle Weight  - 1 x daily - 7 x weekly - 3 sets - 10 reps - Prone Hip Extension with Foot in Dorsiflexion and Ankle Weight  - 1 x daily - 7 x weekly - 3 sets - 10 reps - Clamshell  - 1 x daily - 7 x weekly - 3 sets - 10 reps - Supine Bridge  - 1 x daily - 7 x weekly - 3 sets - 10 reps  ASSESSMENT:  CLINICAL IMPRESSION: Focused strengthening on quads in standing for stability. Good response to exercises with cuing and supervision throughout session for proper  form with exercises. Iontophoresis patch applied for L knee pain as benefit noted. [name] continues to demonstrate potential for improvement and would benefit from continued skilled therapy  to address impairments.        OBJECTIVE IMPAIRMENTS: Abnormal gait, decreased activity tolerance, decreased balance, decreased endurance, decreased mobility, difficulty walking, decreased ROM, decreased strength, increased fascial restrictions, increased muscle spasms, impaired sensation, and pain.   ACTIVITY LIMITATIONS: carrying, lifting, bending, sitting, standing, squatting, sleeping, stairs, transfers, and locomotion level  PARTICIPATION LIMITATIONS: meal prep, cleaning, laundry, driving, shopping, and community activity  PERSONAL FACTORS: Time since onset of injury/illness/exacerbation and 3+ comorbidities: History skin cancer, liver transplant, s/p ORIF Pelvic fx 11/12/19, s/p IMN L distal femur 08/12/2019, s/p L IM nailing femur 11/02/20, L foot drop, lumbar DDD, recent eye surgery  are also affecting patient's functional outcome.   REHAB POTENTIAL: Good  CLINICAL DECISION MAKING: Evolving/moderate complexity  EVALUATION COMPLEXITY: Moderate   GOALS: Goals reviewed with patient? Yes  SHORT TERM GOALS: Target date: 08/03/2022   Patient will be independent with initial HEP. Baseline:  Goal status: MET compliant and I   LONG TERM GOALS: Target date: 09/14/2022 extended through 12/08/2022  Patient will be independent with advanced/ongoing HEP to improve outcomes and carryover.  Baseline:  Goal status: IN PROGRESS 09/15/22- met for current  2.  Patient will report at least 50% improvement in left knee pain to improve QOL. Baseline: sharp pain with all knee movement Goal status: IN PROGRESS 10/17/22- improvement but slightly worse pain today  3.  Patient will report 75% improvement in L hip pain to improve QOL. Baseline: constant severe pain Goal status: MET  09/15/2022 L hip significant  improvement in pain.   Pain now in L SIJ  4. Patient will be able to ascend/descend 1 flight of stairs with 1 HR without increased L hip pain to access home.  Baseline: L hip pain increases on stairs to point interferes with sleep Goal status: IN PROGRESS- 10/17/22 does well with stairs with no pain  5.  Patient will report at least 9 points improvement on LEFS to demonstrate improved functional ability. Baseline: 16/80 Goal status: IN PROGRESS - 09/27/22 LEFS: 32 / 80 = 40.0 %  PLAN:  PT FREQUENCY: 1x/week  PT DURATION: 12 weeks for 10 visits  PLANNED INTERVENTIONS: Therapeutic exercises, Therapeutic activity, Neuromuscular re-education, Balance training, Gait training, Patient/Family education, Self Care, Joint mobilization, Joint manipulation, Stair training, Orthotic/Fit training, Dry Needling, Electrical stimulation, Spinal manipulation, Spinal mobilization, Cryotherapy, Moist heat, scar mobilization, Taping, Traction, Ultrasound, Ionotophoresis 4mg /ml Dexamethasone, Manual therapy, and Re-evaluation  PLAN FOR NEXT SESSION:  Hip & knee strengthening, manual therapy to L hip and knee, Iontophoresis to knee, balance training   Darleene Cleaver, PTA 10/17/2022, 4:01 PM

## 2022-10-31 ENCOUNTER — Encounter: Payer: No Typology Code available for payment source | Admitting: Physical Therapy

## 2022-11-09 ENCOUNTER — Ambulatory Visit: Payer: Medicare PPO | Attending: Emergency Medicine

## 2022-11-09 DIAGNOSIS — M25562 Pain in left knee: Secondary | ICD-10-CM | POA: Diagnosis present

## 2022-11-09 DIAGNOSIS — G8929 Other chronic pain: Secondary | ICD-10-CM | POA: Insufficient documentation

## 2022-11-09 DIAGNOSIS — M25552 Pain in left hip: Secondary | ICD-10-CM | POA: Insufficient documentation

## 2022-11-09 DIAGNOSIS — R2681 Unsteadiness on feet: Secondary | ICD-10-CM | POA: Diagnosis present

## 2022-11-09 DIAGNOSIS — R262 Difficulty in walking, not elsewhere classified: Secondary | ICD-10-CM | POA: Diagnosis present

## 2022-11-09 DIAGNOSIS — M5442 Lumbago with sciatica, left side: Secondary | ICD-10-CM | POA: Diagnosis present

## 2022-11-09 DIAGNOSIS — M6281 Muscle weakness (generalized): Secondary | ICD-10-CM | POA: Diagnosis present

## 2022-11-09 DIAGNOSIS — R2689 Other abnormalities of gait and mobility: Secondary | ICD-10-CM | POA: Insufficient documentation

## 2022-11-09 NOTE — Therapy (Signed)
OUTPATIENT PHYSICAL THERAPY TREATMENT   Patient Name: Andrew Banks MRN: [id number] DOB:08/05/1950, 72 y.o., male Today's Date: 11/09/2022  END OF SESSION:  PT End of Session - 11/09/22 1321     Visit Number 8    Number of Visits 15    Date for PT Re-Evaluation 12/08/22    Authorization Type [employer], [employer]    Authorization Time Period 15 visits approved through [place], [place] 09/15/22- 12/08/22    Authorization - Visit Number 4    Authorization - Number of Visits 10    PT Start Time 1315    PT Stop Time 1400    PT Time Calculation (min) 45 min    Activity Tolerance Patient tolerated treatment well    Behavior During Therapy WFL for tasks assessed/performed                 Past Medical History:  Diagnosis Date   Arthritis    Degenerative disc disease, lumbar    Hypertension    Liver transplanted [facility])    Skin cancer    Past Surgical History:  Procedure Laterality Date   liver transplant     There are no problems to display for this patient.   PCP: Mayra Neer, MD   REFERRING PROVIDER: Clois Comber, PA-C   REFERRING DIAG: [phone] Pain in L knee  THERAPY DIAG:  No diagnosis found.  Rationale for Evaluation and Treatment: Rehabilitation  ONSET DATE: 04/19/2022 acute onset L knee pain  SUBJECTIVE:   SUBJECTIVE STATEMENT: Pt report having to deal with [facility] the past couple of weeks, also notes increased LBP since last visit. Reports that the [place] will not approve of any more PT after what is already authorized.  PERTINENT HISTORY: History skin cancer, liver transplant, s/p ORIF Pelvic fx 11/12/19, s/p IMN L distal femur 08/12/2019, s/p L IM nailing femur 11/02/20, L foot drop, lumbar DDD,  PAIN:  Are you having pain? Yes: NPRS scale: 5/10 Pain location: L knee  Pain description: sharp Aggravating factors: bending knee, cold Relieving factors: PT    PRECAUTIONS: Fall  WEIGHT BEARING RESTRICTIONS: No  FALLS:  Has patient fallen in last 6 months?  No  LIVING ENVIRONMENT: Lives with: lives with their spouse Lives in: House/apartment Stairs: Yes: Internal: 10 steps; on left going up and External: 17 steps; on right going up, on left going up, and can reach both Has following equipment at home: Single point cane, Walker - 4 wheeled, Wheelchair (manual), Tour manager, and Grab bars  OCCUPATION: retired  PLOF: Independent with household mobility with device  PATIENT GOALS: decrease pain  NEXT MD VISIT: to be scheduled  OBJECTIVE:   DIAGNOSTIC FINDINGS:  XR 12/22/20: 1.  Healing unstable traumatic U-shaped sacral fracture (spinopelvic dissociation) status post internal fixation with bilateral iliosacral screws at S1 and A left transsacral transiliac screw at S2. Both hips are located. No hardware complication. No gross change in alignment.   2.  Healed left ischiopubic ramus fracture.   3.  Healing peri-implant pertrochanteric left femoral fracture status post cephalomedullary nail fixation. No hardware is intact without loosening. No change in fracture alignment.   4.  Healed bicondylar distal femoral fracture (extending proximally to the distal third diaphysis) status post ORIF with lateral buttress plate and screws. No hardware complication or change in fracture alignment.   5.  Medial patellar fracture is is not visible on this examination and seen on the CT left knee dated 11/11/2019.   6.  Healed nondisplaced fibular neck  fracture.   7.  No new fractures.   8.  No joint misalignment (both sacroiliac, pubic symphysis, and both hips, and left knee).   9.  Status post embolization of multiple arteries (left L3 and L4 lumbar, right L4 lumbar, 2 branches of the left deep circumflex iliac artery, and bilateral iliolumbar arteries (per Interventional Radiology procedure note dated 11/10/2020) with multiple embolization coils in and around the anatomic pelvis. Vascular plug in the right groin.   10.  Status post ACL reconstruction. Femoral  interference screw and partially imaged tibial screw are intact without loosening.   11.  Osteopenia.   12.  Moderate degenerative changes of the knee.   13.  Mild degenerative changes of the hips and pubic symphysis.   14.  Degenerative changes of the partially imaged lumbosacral spine.   15.  Partially imaged mesh tacks in the abdomen (along the ventral abdominal wall when correlating with prior CT abdomen/pelvis dated 11/16/2019).     PATIENT SURVEYS:  LEFS 16/80 =20% ability   COGNITION: Overall cognitive status: Within functional limits for tasks assessed     SENSATION: Impaired sensation L foot/fibular nerve distribution   MUSCLE LENGTH: Hamstrings: moderate tightness L Piriformis- significant tightness L  POSTURE: rounded shoulders, forward head, and decreased lumbar lordosis  PALPATION: Tenderness L patellar tendon, L pes anserine, tightness L Itband, crepitus and grinding when extends/flexes knee.  Tenderness in L glut medius and piriformis.    LOWER EXTREMITY ROM: L hip ROM limited by pain, increased L knee pain with all L hip movement today, very limited L hip internal rotation, external rotation ~ 45 deg.   LOWER EXTREMITY MMT:  MMT Right eval Left eval  Hip flexion 4+p! 4+p!  Hip extension 5 4+  Hip abduction 5 5  Hip adduction 5 5  Hip internal rotation    Hip external rotation    Knee flexion 5 5p!  Knee extension 5 5p!  Ankle dorsiflexion 5 3+  Ankle plantarflexion 3 3  Ankle inversion    Ankle eversion  2   (Blank rows = not tested)  FUNCTIONAL TESTS:  5 times sit to stand: 12.28 seconds without UE assist.   GAIT: Distance walked: 50' Assistive device utilized: Environmental consultant - 4 wheeled Level of assistance: Modified independence Comments: L knee genu varus, unsteady without walker, wide BOS, visually slow gait speed   TODAY'S TREATMENT:                                                                                                                               DATE:  11/09/22 Therapeutic Exercise: to improve strength and mobility.  Demo, verbal and tactile cues throughout for technique. Nustep L5x71min  NEUROMUSCULAR RE-EDUCATION: To improve posture, balance, and kinesthesia. Toe taps 9' stool 2x10 bil Fwd step and return 2x10 bil Side step and return x 10 bil Standing narrow BOS EC x 30 sec Narrow BOS head turns x 10; trunk rotation x  10  GAIT TRAINING: Gait Training: 70 ft w/o AD with gait belt: 90 ft additional- mild sways to L side x 2, shortened strides, decreased arm swing 10/17/22 Therapeutic Exercise: to improve strength and mobility.  Demo, verbal and tactile cues throughout for technique. Nustep L5x59min Retro step x 20 bil Mini squats at counter 10x3" Seated PF with blue TB 2x10  Standing B toe raises back to wall 2x10 Seated hip up and over x 10  bil  Iontophoresis: applied 4 hour patch with 1 ml dexamethasone (4g/ml) to L medial knee after education on precautions, indications, and how and when to remove patch. Patch #3/6  09/27/22 Therapeutic Exercise: to improve strength and mobility.  Demo, verbal and tactile cues throughout for technique. Gait around clinic 2x90 ft with rollator for warm up LEFS: 32 / 80 = 40.0 % Nustep L6x34min  NEUROMUSCULAR RE-EDUCATION: To improve posture, balance, and kinesthesia. Church pews 2x10  Retro step LLE fwd/RLE back 2x10  Standing march with intermittent UE support x 12 bil LE Clock with colored dots 4x in 1/2 circle pattern BLE with chair for support  Iontophoresis: applied 4 hour patch with 1 ml dexamethasone (4g/ml) to L medial knee after education on precautions, indications, and how and when to remove patch. Patch #2/6  09/15/2022 Therapeutic Exercise: to improve strength and mobility.  Demo, verbal and tactile cues throughout for technique. Nustep L5 x 6 min Review of HEP Manual Therapy: to decrease muscle spasm and pain and improve mobility STM/TPR to L ITBand, hamstrings,  cross friction patellar ligatment, mobs to L ankle, skilled palpation and monitoring during dry needling. Trigger Point Dry-Needling  Treatment instructions: Expect mild to moderate muscle soreness. S/S of pneumothorax if dry needled over a lung field, and to seek immediate medical attention should they occur. Patient verbalized understanding of these instructions and education. Patient Consent Given: Yes Education handout provided: Previously provided Muscles treated: L vastus lateralis and L lateral hamstring Electrical stimulation performed: No Parameters: N/A Treatment response/outcome: Twitch Response Elicited and Palpable Increase in Muscle Length   Iontophoresis: applied 4 hour patch with 1 ml dexamethasone (4g/ml) to L medial knee after education on precautions, indications, and how and when to remove patch.  Therapeutic Activity:  review of goals and concerns, plan of care.   09/07/2022 Therapeutic Exercise: to improve strength and mobility.  Demo, verbal and tactile cues throughout for technique. Nustep L5x12min Clock balance 4x R/L 1/2 circle with SPC Kicking cone over then picking back up with LE x 5 bil Kicking green small ball x 5 with SPC Gait Training with SPC, walking throughout the room ~18ft  Knee flexion 20# 2x10 Knee extension 10# x 10  08/23/2022 Therapeutic Exercise: to improve strength and mobility.  Demo, verbal and tactile cues throughout for technique. Nustep L5x83min Bridges 3x10 SLR 2# 2x10 bil Sit to stands x 20  Bathroom break for patient Knee flexion 20# 2x10 Knee extension 10# 2x10    PATIENT EDUCATION:  Education details: seated PF with TB Person educated: Patient Education method: Explanation and Handouts Education comprehension: verbalized understanding  HOME EXERCISE PROGRAM: Access Code: [email or web address] URL: [email or web address] Date: 08/18/2022 Prepared by: Harrie Foreman  Exercises - Straight Leg Raise with Ankle Weight  - 1 x  daily - 7 x weekly - 3 sets - 10 reps - Prone Hip Extension with Foot in Dorsiflexion and Ankle Weight  - 1 x daily - 7 x weekly - 3 sets - 10 reps - Clamshell  - 1 x  daily - 7 x weekly - 3 sets - 10 reps - Supine Bridge  - 1 x daily - 7 x weekly - 3 sets - 10 reps  ASSESSMENT:  CLINICAL IMPRESSION: Focused on balance activities, mostly stepping to improve gait pattern. Also did gait training w/o AD, with good demonstration but fatigued quickly. He continues to show balance deficits with mild unsteadiness when returning from stepping fwd, stiffness in his L foot also prevent him from being able to fully weight shift through his ankle inhibiting his balance as well. [name] continues to demonstrate potential for improvement and would benefit from continued skilled therapy to address impairments.        OBJECTIVE IMPAIRMENTS: Abnormal gait, decreased activity tolerance, decreased balance, decreased endurance, decreased mobility, difficulty walking, decreased ROM, decreased strength, increased fascial restrictions, increased muscle spasms, impaired sensation, and pain.   ACTIVITY LIMITATIONS: carrying, lifting, bending, sitting, standing, squatting, sleeping, stairs, transfers, and locomotion level  PARTICIPATION LIMITATIONS: meal prep, cleaning, laundry, driving, shopping, and community activity  PERSONAL FACTORS: Time since onset of injury/illness/exacerbation and 3+ comorbidities: History skin cancer, liver transplant, s/p ORIF Pelvic fx 11/12/19, s/p IMN L distal femur 08/12/2019, s/p L IM nailing femur 11/02/20, L foot drop, lumbar DDD, recent eye surgery  are also affecting patient's functional outcome.   REHAB POTENTIAL: Good  CLINICAL DECISION MAKING: Evolving/moderate complexity  EVALUATION COMPLEXITY: Moderate   GOALS: Goals reviewed with patient? Yes  SHORT TERM GOALS: Target date: 08/03/2022   Patient will be independent with initial HEP. Baseline:  Goal status: MET  compliant and I   LONG TERM GOALS: Target date: 09/14/2022 extended through 12/08/2022  Patient will be independent with advanced/ongoing HEP to improve outcomes and carryover.  Baseline:  Goal status: IN PROGRESS 09/15/22- met for current  2.  Patient will report at least 50% improvement in left knee pain to improve QOL. Baseline: sharp pain with all knee movement Goal status: IN PROGRESS 10/17/22- improvement but slightly worse pain today  3.  Patient will report 75% improvement in L hip pain to improve QOL. Baseline: constant severe pain Goal status: MET  09/15/2022 L hip significant improvement in pain.   Pain now in L SIJ  4. Patient will be able to ascend/descend 1 flight of stairs with 1 HR without increased L hip pain to access home.  Baseline: L hip pain increases on stairs to point interferes with sleep Goal status: IN PROGRESS- 10/17/22 does well with stairs with no pain  5.  Patient will report at least 9 points improvement on LEFS to demonstrate improved functional ability. Baseline: 16/80 Goal status: IN PROGRESS - 09/27/22 LEFS: 32 / 80 = 40.0 %  PLAN:  PT FREQUENCY: 1x/week  PT DURATION: 12 weeks for 10 visits  PLANNED INTERVENTIONS: Therapeutic exercises, Therapeutic activity, Neuromuscular re-education, Balance training, Gait training, Patient/Family education, Self Care, Joint mobilization, Joint manipulation, Stair training, Orthotic/Fit training, Dry Needling, Electrical stimulation, Spinal manipulation, Spinal mobilization, Cryotherapy, Moist heat, scar mobilization, Taping, Traction, Ultrasound, Ionotophoresis 4mg /ml Dexamethasone, Manual therapy, and Re-evaluation  PLAN FOR NEXT SESSION:  Hip & knee strengthening, manual therapy to L hip and knee, Iontophoresis to knee, balance training   Darleene Cleaver, PTA 11/09/2022, 2:09 PM

## 2022-11-16 ENCOUNTER — Ambulatory Visit: Payer: Medicare PPO

## 2022-11-16 DIAGNOSIS — R2689 Other abnormalities of gait and mobility: Secondary | ICD-10-CM

## 2022-11-16 DIAGNOSIS — M25562 Pain in left knee: Secondary | ICD-10-CM | POA: Diagnosis not present

## 2022-11-16 DIAGNOSIS — G8929 Other chronic pain: Secondary | ICD-10-CM

## 2022-11-16 DIAGNOSIS — M25552 Pain in left hip: Secondary | ICD-10-CM

## 2022-11-16 DIAGNOSIS — R262 Difficulty in walking, not elsewhere classified: Secondary | ICD-10-CM

## 2022-11-16 DIAGNOSIS — R2681 Unsteadiness on feet: Secondary | ICD-10-CM

## 2022-11-16 DIAGNOSIS — M6281 Muscle weakness (generalized): Secondary | ICD-10-CM

## 2022-11-16 NOTE — Therapy (Signed)
OUTPATIENT PHYSICAL THERAPY TREATMENT   Patient Name: Sahid Borba MRN: [id number] DOB:10-14-1950, 72 y.o., male Today's Date: 11/16/2022  END OF SESSION:  PT End of Session - 11/16/22 1324     Visit Number 9    Number of Visits 15    Date for PT Re-Evaluation 12/08/22    Authorization Type [employer], [employer]    Authorization Time Period 15 visits approved through [place], [place] 09/15/22- 12/08/22    Authorization - Number of Visits 10    PT Start Time 1317    PT Stop Time 1402    PT Time Calculation (min) 45 min    Activity Tolerance Patient tolerated treatment well    Behavior During Therapy WFL for tasks assessed/performed                  Past Medical History:  Diagnosis Date   Arthritis    Degenerative disc disease, lumbar    Hypertension    Liver transplanted [facility])    Skin cancer    Past Surgical History:  Procedure Laterality Date   liver transplant     There are no problems to display for this patient.   PCP: Mayra Neer, MD   REFERRING PROVIDER: Clois Comber, PA-C   REFERRING DIAG: [phone] Pain in L knee  THERAPY DIAG:  Chronic pain of left knee  Muscle weakness (generalized)  Difficulty in walking, not elsewhere classified  Pain in left hip  Unsteadiness on feet  Chronic left-sided low back pain with left-sided sciatica  Other abnormalities of gait and mobility  Rationale for Evaluation and Treatment: Rehabilitation  ONSET DATE: 04/19/2022 acute onset L knee pain  SUBJECTIVE:   SUBJECTIVE STATEMENT: Pt reports he is more stiff today and painful.  PERTINENT HISTORY: History skin cancer, liver transplant, s/p ORIF Pelvic fx 11/12/19, s/p IMN L distal femur 08/12/2019, s/p L IM nailing femur 11/02/20, L foot drop, lumbar DDD,  PAIN:  Are you having pain? Yes: NPRS scale: 6/10 Pain location: L hip, knee, calf, foot and ankle  Pain description: sharp Aggravating factors: bending knee, cold Relieving factors: PT    PRECAUTIONS:  Fall  WEIGHT BEARING RESTRICTIONS: No  FALLS:  Has patient fallen in last 6 months? No  LIVING ENVIRONMENT: Lives with: lives with their spouse Lives in: House/apartment Stairs: Yes: Internal: 10 steps; on left going up and External: 17 steps; on right going up, on left going up, and can reach both Has following equipment at home: Single point cane, Walker - 4 wheeled, Wheelchair (manual), Tour manager, and Grab bars  OCCUPATION: retired  PLOF: Independent with household mobility with device  PATIENT GOALS: decrease pain  NEXT MD VISIT: to be scheduled  OBJECTIVE:   DIAGNOSTIC FINDINGS:  XR 12/22/20: 1.  Healing unstable traumatic U-shaped sacral fracture (spinopelvic dissociation) status post internal fixation with bilateral iliosacral screws at S1 and A left transsacral transiliac screw at S2. Both hips are located. No hardware complication. No gross change in alignment.   2.  Healed left ischiopubic ramus fracture.   3.  Healing peri-implant pertrochanteric left femoral fracture status post cephalomedullary nail fixation. No hardware is intact without loosening. No change in fracture alignment.   4.  Healed bicondylar distal femoral fracture (extending proximally to the distal third diaphysis) status post ORIF with lateral buttress plate and screws. No hardware complication or change in fracture alignment.   5.  Medial patellar fracture is is not visible on this examination and seen on the CT left knee  dated 11/11/2019.   6.  Healed nondisplaced fibular neck fracture.   7.  No new fractures.   8.  No joint misalignment (both sacroiliac, pubic symphysis, and both hips, and left knee).   9.  Status post embolization of multiple arteries (left L3 and L4 lumbar, right L4 lumbar, 2 branches of the left deep circumflex iliac artery, and bilateral iliolumbar arteries (per Interventional Radiology procedure note dated 11/10/2020) with multiple embolization coils in and around the anatomic pelvis.  Vascular plug in the right groin.   10.  Status post ACL reconstruction. Femoral interference screw and partially imaged tibial screw are intact without loosening.   11.  Osteopenia.   12.  Moderate degenerative changes of the knee.   13.  Mild degenerative changes of the hips and pubic symphysis.   14.  Degenerative changes of the partially imaged lumbosacral spine.   15.  Partially imaged mesh tacks in the abdomen (along the ventral abdominal wall when correlating with prior CT abdomen/pelvis dated 11/16/2019).     PATIENT SURVEYS:  LEFS 16/80 =20% ability   COGNITION: Overall cognitive status: Within functional limits for tasks assessed     SENSATION: Impaired sensation L foot/fibular nerve distribution   MUSCLE LENGTH: Hamstrings: moderate tightness L Piriformis- significant tightness L  POSTURE: rounded shoulders, forward head, and decreased lumbar lordosis  PALPATION: Tenderness L patellar tendon, L pes anserine, tightness L Itband, crepitus and grinding when extends/flexes knee.  Tenderness in L glut medius and piriformis.    LOWER EXTREMITY ROM: L hip ROM limited by pain, increased L knee pain with all L hip movement today, very limited L hip internal rotation, external rotation ~ 45 deg.   LOWER EXTREMITY MMT:  MMT Right eval Left eval  Hip flexion 4+p! 4+p!  Hip extension 5 4+  Hip abduction 5 5  Hip adduction 5 5  Hip internal rotation    Hip external rotation    Knee flexion 5 5p!  Knee extension 5 5p!  Ankle dorsiflexion 5 3+  Ankle plantarflexion 3 3  Ankle inversion    Ankle eversion  2   (Blank rows = not tested)  FUNCTIONAL TESTS:  5 times sit to stand: 12.28 seconds without UE assist.   GAIT: Distance walked: 50' Assistive device utilized: Environmental consultant - 4 wheeled Level of assistance: Modified independence Comments: L knee genu varus, unsteady without walker, wide BOS, visually slow gait speed   TODAY'S TREATMENT:                                                                                                                               DATE:  11/16/22 Therapeutic Exercise: to improve strength and mobility.  Demo, verbal and tactile cues throughout for technique. Nustep L6x64min Seated lumbar flexion x 10 AROM with pball Standing toe taps to cone in front 2 x 10 bil Standing hip abduction x 10 RTB at knees  Standing march x 10 RTB at  knees Standing hip extension x 10 RTB at knees  Side step no UE support along length of mat table RTB at knees 4x each end of the table  Iontophoresis: applied 4 hour patch with 1 ml dexamethasone (4g/ml) to L medial knee after education on precautions, indications, and how and when to remove patch. Patch 4/6  11/09/22 Therapeutic Exercise: to improve strength and mobility.  Demo, verbal and tactile cues throughout for technique. Nustep L5x61min  NEUROMUSCULAR RE-EDUCATION: To improve posture, balance, and kinesthesia. Toe taps 9' stool 2x10 bil Fwd step and return 2x10 bil Side step and return x 10 bil Standing narrow BOS EC x 30 sec Narrow BOS head turns x 10; trunk rotation x 10  GAIT TRAINING: Gait Training: 70 ft w/o AD with gait belt: 90 ft additional- mild sways to L side x 2, shortened strides, decreased arm swing 10/17/22 Therapeutic Exercise: to improve strength and mobility.  Demo, verbal and tactile cues throughout for technique. Nustep L5x87min Retro step x 20 bil Mini squats at counter 10x3" Seated PF with blue TB 2x10  Standing B toe raises back to wall 2x10 Seated hip up and over x 10  bil  Iontophoresis: applied 4 hour patch with 1 ml dexamethasone (4g/ml) to L medial knee after education on precautions, indications, and how and when to remove patch. Patch #3/6  09/27/22 Therapeutic Exercise: to improve strength and mobility.  Demo, verbal and tactile cues throughout for technique. Gait around clinic 2x90 ft with rollator for warm up LEFS: 32 / 80 = 40.0 % Nustep  L6x76min  NEUROMUSCULAR RE-EDUCATION: To improve posture, balance, and kinesthesia. Church pews 2x10  Retro step LLE fwd/RLE back 2x10  Standing march with intermittent UE support x 12 bil LE Clock with colored dots 4x in 1/2 circle pattern BLE with chair for support  Iontophoresis: applied 4 hour patch with 1 ml dexamethasone (4g/ml) to L medial knee after education on precautions, indications, and how and when to remove patch. Patch #2/6  09/15/2022 Therapeutic Exercise: to improve strength and mobility.  Demo, verbal and tactile cues throughout for technique. Nustep L5 x 6 min Review of HEP Manual Therapy: to decrease muscle spasm and pain and improve mobility STM/TPR to L ITBand, hamstrings, cross friction patellar ligatment, mobs to L ankle, skilled palpation and monitoring during dry needling. Trigger Point Dry-Needling  Treatment instructions: Expect mild to moderate muscle soreness. S/S of pneumothorax if dry needled over a lung field, and to seek immediate medical attention should they occur. Patient verbalized understanding of these instructions and education. Patient Consent Given: Yes Education handout provided: Previously provided Muscles treated: L vastus lateralis and L lateral hamstring Electrical stimulation performed: No Parameters: N/A Treatment response/outcome: Twitch Response Elicited and Palpable Increase in Muscle Length   Iontophoresis: applied 4 hour patch with 1 ml dexamethasone (4g/ml) to L medial knee after education on precautions, indications, and how and when to remove patch.  Therapeutic Activity:  review of goals and concerns, plan of care.   09/07/2022 Therapeutic Exercise: to improve strength and mobility.  Demo, verbal and tactile cues throughout for technique. Nustep L5x70min Clock balance 4x R/L 1/2 circle with SPC Kicking cone over then picking back up with LE x 5 bil Kicking green small ball x 5 with SPC Gait Training with SPC, walking throughout the  room ~5ft  Knee flexion 20# 2x10 Knee extension 10# x 10  08/23/2022 Therapeutic Exercise: to improve strength and mobility.  Demo, verbal and tactile cues throughout for  technique. Nustep L5x31min Bridges 3x10 SLR 2# 2x10 bil Sit to stands x 20  Bathroom break for patient Knee flexion 20# 2x10 Knee extension 10# 2x10    PATIENT EDUCATION:  Education details: seated PF with TB Person educated: Patient Education method: Explanation and Handouts Education comprehension: verbalized understanding  HOME EXERCISE PROGRAM: Access Code: [email or web address] URL: [email or web address] Date: 08/18/2022 Prepared by: Harrie Foreman  Exercises - Straight Leg Raise with Ankle Weight  - 1 x daily - 7 x weekly - 3 sets - 10 reps - Prone Hip Extension with Foot in Dorsiflexion and Ankle Weight  - 1 x daily - 7 x weekly - 3 sets - 10 reps - Clamshell  - 1 x daily - 7 x weekly - 3 sets - 10 reps - Supine Bridge  - 1 x daily - 7 x weekly - 3 sets - 10 reps  ASSESSMENT:  CLINICAL IMPRESSION: Continued progressing hip strengthening and stabilization to improve function. Cues needed to reset in between movements to maintain balance. Cues also provided to correct form as needed. Pt fatigued after interventions today but good response.  [name] continues to demonstrate potential for improvement and would benefit from continued skilled therapy to address impairments.        OBJECTIVE IMPAIRMENTS: Abnormal gait, decreased activity tolerance, decreased balance, decreased endurance, decreased mobility, difficulty walking, decreased ROM, decreased strength, increased fascial restrictions, increased muscle spasms, impaired sensation, and pain.   ACTIVITY LIMITATIONS: carrying, lifting, bending, sitting, standing, squatting, sleeping, stairs, transfers, and locomotion level  PARTICIPATION LIMITATIONS: meal prep, cleaning, laundry, driving, shopping, and community activity  PERSONAL FACTORS:  Time since onset of injury/illness/exacerbation and 3+ comorbidities: History skin cancer, liver transplant, s/p ORIF Pelvic fx 11/12/19, s/p IMN L distal femur 08/12/2019, s/p L IM nailing femur 11/02/20, L foot drop, lumbar DDD, recent eye surgery  are also affecting patient's functional outcome.   REHAB POTENTIAL: Good  CLINICAL DECISION MAKING: Evolving/moderate complexity  EVALUATION COMPLEXITY: Moderate   GOALS: Goals reviewed with patient? Yes  SHORT TERM GOALS: Target date: 08/03/2022   Patient will be independent with initial HEP. Baseline:  Goal status: MET compliant and I   LONG TERM GOALS: Target date: 09/14/2022 extended through 12/08/2022  Patient will be independent with advanced/ongoing HEP to improve outcomes and carryover.  Baseline:  Goal status: IN PROGRESS 09/15/22- met for current  2.  Patient will report at least 50% improvement in left knee pain to improve QOL. Baseline: sharp pain with all knee movement Goal status: IN PROGRESS 10/17/22- improvement but slightly worse pain today  3.  Patient will report 75% improvement in L hip pain to improve QOL. Baseline: constant severe pain Goal status: MET  09/15/2022 L hip significant improvement in pain.   Pain now in L SIJ  4. Patient will be able to ascend/descend 1 flight of stairs with 1 HR without increased L hip pain to access home.  Baseline: L hip pain increases on stairs to point interferes with sleep Goal status: IN PROGRESS- 10/17/22 does well with stairs with no pain  5.  Patient will report at least 9 points improvement on LEFS to demonstrate improved functional ability. Baseline: 16/80 Goal status: IN PROGRESS - 09/27/22 LEFS: 32 / 80 = 40.0 %  PLAN:  PT FREQUENCY: 1x/week  PT DURATION: 12 weeks for 10 visits  PLANNED INTERVENTIONS: Therapeutic exercises, Therapeutic activity, Neuromuscular re-education, Balance training, Gait training, Patient/Family education, Self Care, Joint mobilization, Joint  manipulation, Stair training, Orthotic/Fit training, Dry Needling,  Electrical stimulation, Spinal manipulation, Spinal mobilization, Cryotherapy, Moist heat, scar mobilization, Taping, Traction, Ultrasound, Ionotophoresis 4mg /ml Dexamethasone, Manual therapy, and Re-evaluation  PLAN FOR NEXT SESSION:  Hip & knee strengthening, manual therapy to L hip and knee, Iontophoresis to knee, balance training   Darleene Cleaver, PTA 11/16/2022, 2:31 PM

## 2022-11-24 ENCOUNTER — Encounter: Payer: Self-pay | Admitting: Physical Therapy

## 2022-11-24 ENCOUNTER — Ambulatory Visit: Payer: Medicare PPO | Admitting: Physical Therapy

## 2022-11-24 DIAGNOSIS — M25562 Pain in left knee: Secondary | ICD-10-CM | POA: Diagnosis not present

## 2022-11-24 DIAGNOSIS — M6281 Muscle weakness (generalized): Secondary | ICD-10-CM

## 2022-11-24 DIAGNOSIS — G8929 Other chronic pain: Secondary | ICD-10-CM

## 2022-11-24 DIAGNOSIS — R262 Difficulty in walking, not elsewhere classified: Secondary | ICD-10-CM

## 2022-11-24 NOTE — Therapy (Signed)
OUTPATIENT PHYSICAL THERAPY TREATMENT Progress Note Reporting Period 07/20/2022 to 11/24/22  See note below for Objective Data and Assessment of Progress/Goals.      Patient Name: Andrew Banks MRN: [id number] DOB:08-05-1950, 72 y.o., male Today's Date: 11/24/2022  END OF SESSION:  PT End of Session - 11/24/22 1323     Visit Number 10    Number of Visits --    Date for PT Re-Evaluation 12/08/22    Authorization Type [employer], [employer]    Authorization Time Period 6 visits approved through [place] 10/07/22-12/08/22    Authorization - Visit Number 4    Authorization - Number of Visits 6    Progress Note Due on Visit 10    PT Start Time 1319    PT Stop Time 1400    PT Time Calculation (min) 41 min    Activity Tolerance Patient tolerated treatment well    Behavior During Therapy WFL for tasks assessed/performed                  Past Medical History:  Diagnosis Date   Arthritis    Degenerative disc disease, lumbar    Hypertension    Liver transplanted [facility])    Skin cancer    Past Surgical History:  Procedure Laterality Date   liver transplant     There are no problems to display for this patient.   PCP: Mayra Neer, MD   REFERRING PROVIDER: Clois Comber, PA-C   REFERRING DIAG: [phone] Pain in L knee  THERAPY DIAG:  Chronic pain of left knee  Muscle weakness (generalized)  Difficulty in walking, not elsewhere classified  Rationale for Evaluation and Treatment: Rehabilitation  ONSET DATE: 04/19/2022 acute onset L knee pain  SUBJECTIVE:   SUBJECTIVE STATEMENT: Knee has been more painful with colder weather.   L hip has also been hurting worse since knee has been acting up, thinks due to compensating for the knee  PERTINENT HISTORY: History skin cancer, liver transplant, s/p ORIF Pelvic fx 11/12/19, s/p IMN L distal femur 08/12/2019, s/p L IM nailing femur 11/02/20, L foot drop, lumbar DDD,  PAIN:  Are you having pain? Yes: NPRS scale: 7/10 Pain  location: L hip, knee, calf, foot and ankle  Pain description: sharp Aggravating factors: bending knee, cold Relieving factors: PT    PRECAUTIONS: Fall  WEIGHT BEARING RESTRICTIONS: No  FALLS:  Has patient fallen in last 6 months? No  LIVING ENVIRONMENT: Lives with: lives with their spouse Lives in: House/apartment Stairs: Yes: Internal: 10 steps; on left going up and External: 17 steps; on right going up, on left going up, and can reach both Has following equipment at home: Single point cane, Walker - 4 wheeled, Wheelchair (manual), Tour manager, and Grab bars  OCCUPATION: retired  PLOF: Independent with household mobility with device  PATIENT GOALS: decrease pain  NEXT MD VISIT: to be scheduled  OBJECTIVE:   DIAGNOSTIC FINDINGS:  XR 12/22/20: 1.  Healing unstable traumatic U-shaped sacral fracture (spinopelvic dissociation) status post internal fixation with bilateral iliosacral screws at S1 and A left transsacral transiliac screw at S2. Both hips are located. No hardware complication. No gross change in alignment.   2.  Healed left ischiopubic ramus fracture.   3.  Healing peri-implant pertrochanteric left femoral fracture status post cephalomedullary nail fixation. No hardware is intact without loosening. No change in fracture alignment.   4.  Healed bicondylar distal femoral fracture (extending proximally to the distal third diaphysis) status post ORIF with lateral buttress  plate and screws. No hardware complication or change in fracture alignment.   5.  Medial patellar fracture is is not visible on this examination and seen on the CT left knee dated 11/11/2019.   6.  Healed nondisplaced fibular neck fracture.   7.  No new fractures.   8.  No joint misalignment (both sacroiliac, pubic symphysis, and both hips, and left knee).   9.  Status post embolization of multiple arteries (left L3 and L4 lumbar, right L4 lumbar, 2 branches of the left deep circumflex iliac artery, and  bilateral iliolumbar arteries (per Interventional Radiology procedure note dated 11/10/2020) with multiple embolization coils in and around the anatomic pelvis. Vascular plug in the right groin.   10.  Status post ACL reconstruction. Femoral interference screw and partially imaged tibial screw are intact without loosening.   11.  Osteopenia.   12.  Moderate degenerative changes of the knee.   13.  Mild degenerative changes of the hips and pubic symphysis.   14.  Degenerative changes of the partially imaged lumbosacral spine.   15.  Partially imaged mesh tacks in the abdomen (along the ventral abdominal wall when correlating with prior CT abdomen/pelvis dated 11/16/2019).     PATIENT SURVEYS:  LEFS 16/80 =20% ability   COGNITION: Overall cognitive status: Within functional limits for tasks assessed     SENSATION: Impaired sensation L foot/fibular nerve distribution   MUSCLE LENGTH: Hamstrings: moderate tightness L Piriformis- significant tightness L  POSTURE: rounded shoulders, forward head, and decreased lumbar lordosis  PALPATION: Tenderness L patellar tendon, L pes anserine, tightness L Itband, crepitus and grinding when extends/flexes knee.  Tenderness in L glut medius and piriformis.    LOWER EXTREMITY ROM: L hip ROM limited by pain, increased L knee pain with all L hip movement today, very limited L hip internal rotation, external rotation ~ 45 deg.   LOWER EXTREMITY MMT:  MMT Right eval Left eval  Hip flexion 4+p! 4+p!  Hip extension 5 4+  Hip abduction 5 5  Hip adduction 5 5  Hip internal rotation    Hip external rotation    Knee flexion 5 5p!  Knee extension 5 5p!  Ankle dorsiflexion 5 3+  Ankle plantarflexion 3 3  Ankle inversion    Ankle eversion  2   (Blank rows = not tested)  FUNCTIONAL TESTS:  5 times sit to stand: 12.28 seconds without UE assist.   GAIT: Distance walked: 50' Assistive device utilized: Environmental consultant - 4 wheeled Level of assistance: Modified  independence Comments: L knee genu varus, unsteady without walker, wide BOS, visually slow gait speed   TODAY'S TREATMENT:                                                                                                                              DATE:   11/24/22 Therapeutic Activity:   Nustep L6 x 6 min for warm-up Review of progress, goals and concerns.  Manual  Therapy: to decrease muscle spasm and pain and improve mobility STM/TPR to L glutes, TFL and piriformis.  skilled palpation and monitoring during dry needling. Trigger Point Dry-Needling  Treatment instructions: Expect mild to moderate muscle soreness. S/S of pneumothorax if dry needled over a lung field, and to seek immediate medical attention should they occur. Patient verbalized understanding of these instructions and education. Patient Consent Given: Yes Education handout provided: Previously provided Muscles treated: L gluteus med/mid/max, L piriformis, L TFL Electrical stimulation performed: No Parameters: N/A Treatment response/outcome: Twitch Response Elicited and Palpable Increase in Muscle Length Iontophoresis: applied 4 hour patch with 1 ml dexamethasone (4g/ml) to L medial knee after education on precautions, indications, and how and when to remove patch. Patch 5/6   11/16/22 Therapeutic Exercise: to improve strength and mobility.  Demo, verbal and tactile cues throughout for technique. Nustep L6x20min Seated lumbar flexion x 10 AROM with pball Standing toe taps to cone in front 2 x 10 bil Standing hip abduction x 10 RTB at knees  Standing march x 10 RTB at knees Standing hip extension x 10 RTB at knees  Side step no UE support along length of mat table RTB at knees 4x each end of the table  Iontophoresis: applied 4 hour patch with 1 ml dexamethasone (4g/ml) to L medial knee after education on precautions, indications, and how and when to remove patch. Patch 4/6  11/09/22 Therapeutic Exercise: to improve  strength and mobility.  Demo, verbal and tactile cues throughout for technique. Nustep L5x44min  NEUROMUSCULAR RE-EDUCATION: To improve posture, balance, and kinesthesia. Toe taps 9' stool 2x10 bil Fwd step and return 2x10 bil Side step and return x 10 bil Standing narrow BOS EC x 30 sec Narrow BOS head turns x 10; trunk rotation x 10  GAIT TRAINING: Gait Training: 70 ft w/o AD with gait belt: 90 ft additional- mild sways to L side x 2, shortened strides, decreased arm swing 10/17/22 Therapeutic Exercise: to improve strength and mobility.  Demo, verbal and tactile cues throughout for technique. Nustep L5x37min Retro step x 20 bil Mini squats at counter 10x3" Seated PF with blue TB 2x10  Standing B toe raises back to wall 2x10 Seated hip up and over x 10  bil  Iontophoresis: applied 4 hour patch with 1 ml dexamethasone (4g/ml) to L medial knee after education on precautions, indications, and how and when to remove patch. Patch #3/6    PATIENT EDUCATION:  Education details: continue HEP as tolerated  Person educated: Patient Education method: Chief Technology Officer Education comprehension: verbalized understanding  HOME EXERCISE PROGRAM: Access Code: [email or web address] URL: [email or web address] Date: 08/18/2022 Prepared by: Harrie Foreman  Exercises - Straight Leg Raise with Ankle Weight  - 1 x daily - 7 x weekly - 3 sets - 10 reps - Prone Hip Extension with Foot in Dorsiflexion and Ankle Weight  - 1 x daily - 7 x weekly - 3 sets - 10 reps - Clamshell  - 1 x daily - 7 x weekly - 3 sets - 10 reps - Supine Bridge  - 1 x daily - 7 x weekly - 3 sets - 10 reps  ASSESSMENT:  CLINICAL IMPRESSION: Andrew Banks reports overall improvement in L hip and knee pain since starting therapy, at least 50%, meeting LTG # 2, able to to tolerate ambulating farther distances and less instances of L knee locking and causing severe pain.  He has also not had any more falls since  starting therapy (he has had  multiple falls with severe injury in the past).  He does report more difficulty with colder weather, resulting in more L hip pain.  Noted significant trigger points in L hip today with palpation, reproducing symptoms into foot with TrDN today, but reporting pain decreased from 7/10 to 4/10 after interventions.  Andrew Banks continues to demonstrate potential for improvement and would benefit from continued skilled therapy to address impairments.        OBJECTIVE IMPAIRMENTS: Abnormal gait, decreased activity tolerance, decreased balance, decreased endurance, decreased mobility, difficulty walking, decreased ROM, decreased strength, increased fascial restrictions, increased muscle spasms, impaired sensation, and pain.   ACTIVITY LIMITATIONS: carrying, lifting, bending, sitting, standing, squatting, sleeping, stairs, transfers, and locomotion level  PARTICIPATION LIMITATIONS: meal prep, cleaning, laundry, driving, shopping, and community activity  PERSONAL FACTORS: Time since onset of injury/illness/exacerbation and 3+ comorbidities: History skin cancer, liver transplant, s/p ORIF Pelvic fx 11/12/19, s/p IMN L distal femur 08/12/2019, s/p L IM nailing femur 11/02/20, L foot drop, lumbar DDD, recent eye surgery  are also affecting patient's functional outcome.   REHAB POTENTIAL: Good  CLINICAL DECISION MAKING: Evolving/moderate complexity  EVALUATION COMPLEXITY: Moderate   GOALS: Goals reviewed with patient? Yes  SHORT TERM GOALS: Target date: 08/03/2022   Patient will be independent with initial HEP. Baseline:  Goal status: MET compliant and I   LONG TERM GOALS: Target date: 09/14/2022 extended through 12/08/2022  Patient will be independent with advanced/ongoing HEP to improve outcomes and carryover.  Baseline:  Goal status: IN PROGRESS 11/24/22 Met for current.    2.  Patient will report at least 50% improvement in left knee pain to improve QOL. Baseline:  sharp pain with all knee movement Goal status: MET 10/17/22- improvement but slightly worse pain today 11/24/22- at times still painful and locks, but overall 50% improvement overall.    3.  Patient will report 75% improvement in L hip pain to improve QOL. Baseline: constant severe pain Goal status: IN PROGRESS  09/15/2022 MET L hip significant improvement in pain.   11/24/22- started having pain 2 weeks ago in L hip again.   4. Patient will be able to ascend/descend 1 flight of stairs with 1 HR without increased L hip pain to access home.  Baseline: L hip pain increases on stairs to point interferes with sleep Goal status: IN PROGRESS- 10/17/22 does well with stairs with no pain 11/24/22 - doing ok on stairs, being careful due to falls, pain significantly less, if L knee painful compensates at hip which causes L hip pain.   5.  Patient will report at least 9 points improvement on LEFS to demonstrate improved functional ability. Baseline: 16/80 Goal status: MET - 09/27/22 LEFS: 32 / 80 = 40.0 %  PLAN:  PT FREQUENCY: 1x/week  PT DURATION: 12 weeks for 10 visits  PLANNED INTERVENTIONS: Therapeutic exercises, Therapeutic activity, Neuromuscular re-education, Balance training, Gait training, Patient/Family education, Self Care, Joint mobilization, Joint manipulation, Stair training, Orthotic/Fit training, Dry Needling, Electrical stimulation, Spinal manipulation, Spinal mobilization, Cryotherapy, Moist heat, scar mobilization, Taping, Traction, Ultrasound, Ionotophoresis 4mg /ml Dexamethasone, Manual therapy, and Re-evaluation  PLAN FOR NEXT SESSION:  Hip & knee strengthening, manual therapy to L hip and knee, Iontophoresis to knee, balance training   Jena Gauss, PT 11/24/2022, 4:12 PM

## 2022-12-01 ENCOUNTER — Ambulatory Visit: Payer: Medicare PPO

## 2022-12-01 DIAGNOSIS — M6281 Muscle weakness (generalized): Secondary | ICD-10-CM

## 2022-12-01 DIAGNOSIS — M25552 Pain in left hip: Secondary | ICD-10-CM

## 2022-12-01 DIAGNOSIS — M25562 Pain in left knee: Secondary | ICD-10-CM | POA: Diagnosis not present

## 2022-12-01 DIAGNOSIS — G8929 Other chronic pain: Secondary | ICD-10-CM

## 2022-12-01 DIAGNOSIS — R2681 Unsteadiness on feet: Secondary | ICD-10-CM

## 2022-12-01 DIAGNOSIS — R2689 Other abnormalities of gait and mobility: Secondary | ICD-10-CM

## 2022-12-01 DIAGNOSIS — R262 Difficulty in walking, not elsewhere classified: Secondary | ICD-10-CM

## 2022-12-01 NOTE — Therapy (Signed)
OUTPATIENT PHYSICAL THERAPY TREATMENT     Patient Name: Andrew Banks MRN: [id number] DOB:1950/12/29, 72 y.o., male Today's Date: 12/01/2022  END OF SESSION:  PT End of Session - 12/01/22 1406     Visit Number 11    Date for PT Re-Evaluation 12/08/22    Authorization Type [employer], [employer]    Authorization Time Period 6 visits approved through [place] 10/07/22-12/08/22    Authorization - Visit Number 5    Authorization - Number of Visits 6    Progress Note Due on Visit 10    PT Start Time 1316    PT Stop Time 1401    PT Time Calculation (min) 45 min    Activity Tolerance Patient tolerated treatment well    Behavior During Therapy WFL for tasks assessed/performed                   Past Medical History:  Diagnosis Date   Arthritis    Degenerative disc disease, lumbar    Hypertension    Liver transplanted [facility])    Skin cancer    Past Surgical History:  Procedure Laterality Date   liver transplant     There are no problems to display for this patient.   PCP: Mayra Neer, MD   REFERRING PROVIDER: Clois Comber, PA-C   REFERRING DIAG: [phone] Pain in L knee  THERAPY DIAG:  Chronic pain of left knee  Muscle weakness (generalized)  Difficulty in walking, not elsewhere classified  Pain in left hip  Unsteadiness on feet  Chronic left-sided low back pain with left-sided sciatica  Other abnormalities of gait and mobility  Rationale for Evaluation and Treatment: Rehabilitation  ONSET DATE: 04/19/2022 acute onset L knee pain  SUBJECTIVE:   SUBJECTIVE STATEMENT: Pt reports he is changing insurances and today is the last day scheduled  PERTINENT HISTORY: History skin cancer, liver transplant, s/p ORIF Pelvic fx 11/12/19, s/p IMN L distal femur 08/12/2019, s/p L IM nailing femur 11/02/20, L foot drop, lumbar DDD,  PAIN:  Are you having pain? Yes: NPRS scale: 7/10 Pain location: L hip, knee, calf, foot and ankle  Pain description: sharp Aggravating  factors: bending knee, cold Relieving factors: PT    PRECAUTIONS: Fall  WEIGHT BEARING RESTRICTIONS: No  FALLS:  Has patient fallen in last 6 months? No  LIVING ENVIRONMENT: Lives with: lives with their spouse Lives in: House/apartment Stairs: Yes: Internal: 10 steps; on left going up and External: 17 steps; on right going up, on left going up, and can reach both Has following equipment at home: Single point cane, Walker - 4 wheeled, Wheelchair (manual), Tour manager, and Grab bars  OCCUPATION: retired  PLOF: Independent with household mobility with device  PATIENT GOALS: decrease pain  NEXT MD VISIT: to be scheduled  OBJECTIVE:   DIAGNOSTIC FINDINGS:  XR 12/22/20: 1.  Healing unstable traumatic U-shaped sacral fracture (spinopelvic dissociation) status post internal fixation with bilateral iliosacral screws at S1 and A left transsacral transiliac screw at S2. Both hips are located. No hardware complication. No gross change in alignment.   2.  Healed left ischiopubic ramus fracture.   3.  Healing peri-implant pertrochanteric left femoral fracture status post cephalomedullary nail fixation. No hardware is intact without loosening. No change in fracture alignment.   4.  Healed bicondylar distal femoral fracture (extending proximally to the distal third diaphysis) status post ORIF with lateral buttress plate and screws. No hardware complication or change in fracture alignment.   5.  Medial patellar  fracture is is not visible on this examination and seen on the CT left knee dated 11/11/2019.   6.  Healed nondisplaced fibular neck fracture.   7.  No new fractures.   8.  No joint misalignment (both sacroiliac, pubic symphysis, and both hips, and left knee).   9.  Status post embolization of multiple arteries (left L3 and L4 lumbar, right L4 lumbar, 2 branches of the left deep circumflex iliac artery, and bilateral iliolumbar arteries (per Interventional Radiology procedure note dated  11/10/2020) with multiple embolization coils in and around the anatomic pelvis. Vascular plug in the right groin.   10.  Status post ACL reconstruction. Femoral interference screw and partially imaged tibial screw are intact without loosening.   11.  Osteopenia.   12.  Moderate degenerative changes of the knee.   13.  Mild degenerative changes of the hips and pubic symphysis.   14.  Degenerative changes of the partially imaged lumbosacral spine.   15.  Partially imaged mesh tacks in the abdomen (along the ventral abdominal wall when correlating with prior CT abdomen/pelvis dated 11/16/2019).     PATIENT SURVEYS:  LEFS 16/80 =20% ability   COGNITION: Overall cognitive status: Within functional limits for tasks assessed     SENSATION: Impaired sensation L foot/fibular nerve distribution   MUSCLE LENGTH: Hamstrings: moderate tightness L Piriformis- significant tightness L  POSTURE: rounded shoulders, forward head, and decreased lumbar lordosis  PALPATION: Tenderness L patellar tendon, L pes anserine, tightness L Itband, crepitus and grinding when extends/flexes knee.  Tenderness in L glut medius and piriformis.    LOWER EXTREMITY ROM: L hip ROM limited by pain, increased L knee pain with all L hip movement today, very limited L hip internal rotation, external rotation ~ 45 deg.   LOWER EXTREMITY MMT:  MMT Right eval Left eval  Hip flexion 4+p! 4+p!  Hip extension 5 4+  Hip abduction 5 5  Hip adduction 5 5  Hip internal rotation    Hip external rotation    Knee flexion 5 5p!  Knee extension 5 5p!  Ankle dorsiflexion 5 3+  Ankle plantarflexion 3 3  Ankle inversion    Ankle eversion  2   (Blank rows = not tested)  FUNCTIONAL TESTS:  5 times sit to stand: 12.28 seconds without UE assist.   GAIT: Distance walked: 50' Assistive device utilized: Environmental consultant - 4 wheeled Level of assistance: Modified independence Comments: L knee genu varus, unsteady without walker, wide BOS,  visually slow gait speed   TODAY'S TREATMENT:                                                                                                                              DATE:  12/01/22 Therapeutic Exercise: to improve strength and mobility.  Demo, verbal and tactile cues throughout for technique. Nustep L5x31min UE/LE Seated lumbar flexion stretch and L side bend  2x30"  Seated L sciatic nerve glide x 10  Standing lumbar extension 12x3" Reviewed HEP and updated Manual Therapy: to decrease muscle spasm and pain and improve mobility STM/TPR to L glutes, TFL and piriformis.   11/24/22 Therapeutic Activity:   Nustep L6 x 6 min for warm-up Review of progress, goals and concerns.  Manual Therapy: to decrease muscle spasm and pain and improve mobility STM/TPR to L glutes, TFL and piriformis.  skilled palpation and monitoring during dry needling. Trigger Point Dry-Needling  Treatment instructions: Expect mild to moderate muscle soreness. S/S of pneumothorax if dry needled over a lung field, and to seek immediate medical attention should they occur. Patient verbalized understanding of these instructions and education. Patient Consent Given: Yes Education handout provided: Previously provided Muscles treated: L gluteus med/mid/max, L piriformis, L TFL Electrical stimulation performed: No Parameters: N/A Treatment response/outcome: Twitch Response Elicited and Palpable Increase in Muscle Length Iontophoresis: applied 4 hour patch with 1 ml dexamethasone (4g/ml) to L medial knee after education on precautions, indications, and how and when to remove patch. Patch 5/6   11/16/22 Therapeutic Exercise: to improve strength and mobility.  Demo, verbal and tactile cues throughout for technique. Nustep L6x41min Seated lumbar flexion x 10 AROM with pball Standing toe taps to cone in front 2 x 10 bil Standing hip abduction x 10 RTB at knees  Standing march x 10 RTB at knees Standing hip extension x  10 RTB at knees  Side step no UE support along length of mat table RTB at knees 4x each end of the table  Iontophoresis: applied 4 hour patch with 1 ml dexamethasone (4g/ml) to L medial knee after education on precautions, indications, and how and when to remove patch. Patch 4/6  11/09/22 Therapeutic Exercise: to improve strength and mobility.  Demo, verbal and tactile cues throughout for technique. Nustep L5x44min  NEUROMUSCULAR RE-EDUCATION: To improve posture, balance, and kinesthesia. Toe taps 9' stool 2x10 bil Fwd step and return 2x10 bil Side step and return x 10 bil Standing narrow BOS EC x 30 sec Narrow BOS head turns x 10; trunk rotation x 10  GAIT TRAINING: Gait Training: 70 ft w/o AD with gait belt: 90 ft additional- mild sways to L side x 2, shortened strides, decreased arm swing 10/17/22 Therapeutic Exercise: to improve strength and mobility.  Demo, verbal and tactile cues throughout for technique. Nustep L5x37min Retro step x 20 bil Mini squats at counter 10x3" Seated PF with blue TB 2x10  Standing B toe raises back to wall 2x10 Seated hip up and over x 10  bil  Iontophoresis: applied 4 hour patch with 1 ml dexamethasone (4g/ml) to L medial knee after education on precautions, indications, and how and when to remove patch. Patch #3/6    PATIENT EDUCATION:  Education details: HEP updated Person educated: Patient Education method: Chief Technology Officer Education comprehension: verbalized understanding  HOME EXERCISE PROGRAM: Access Code: [email or web address] URL: [email or web address] Date: 08/18/2022 Prepared by: Harrie Foreman  Exercises - Straight Leg Raise with Ankle Weight  - 1 x daily - 7 x weekly - 3 sets - 10 reps - Prone Hip Extension with Foot in Dorsiflexion and Ankle Weight  - 1 x daily - 7 x weekly - 3 sets - 10 reps - Clamshell  - 1 x daily - 7 x weekly - 3 sets - 10 reps - Supine Bridge  - 1 x daily - 7 x weekly - 3 sets - 10  reps  ASSESSMENT:  CLINICAL IMPRESSION: Andrew Banks noted having some soreness and pain along his  L glutes/piriformis today. The STM helped decrease pain and improve mobility. We did review his HEP for D/C adding more standing exercises he can use with ankle weights. Goals were re-assessed last visit and not much change in status today. Pt wishing to take a break from PT for now will go on 30 day hold. Andrew Banks continues to demonstrate potential for improvement and would benefit from continued skilled therapy to address impairments.        OBJECTIVE IMPAIRMENTS: Abnormal gait, decreased activity tolerance, decreased balance, decreased endurance, decreased mobility, difficulty walking, decreased ROM, decreased strength, increased fascial restrictions, increased muscle spasms, impaired sensation, and pain.   ACTIVITY LIMITATIONS: carrying, lifting, bending, sitting, standing, squatting, sleeping, stairs, transfers, and locomotion level  PARTICIPATION LIMITATIONS: meal prep, cleaning, laundry, driving, shopping, and community activity  PERSONAL FACTORS: Time since onset of injury/illness/exacerbation and 3+ comorbidities: History skin cancer, liver transplant, s/p ORIF Pelvic fx 11/12/19, s/p IMN L distal femur 08/12/2019, s/p L IM nailing femur 11/02/20, L foot drop, lumbar DDD, recent eye surgery  are also affecting patient's functional outcome.   REHAB POTENTIAL: Good  CLINICAL DECISION MAKING: Evolving/moderate complexity  EVALUATION COMPLEXITY: Moderate   GOALS: Goals reviewed with patient? Yes  SHORT TERM GOALS: Target date: 08/03/2022   Patient will be independent with initial HEP. Baseline:  Goal status: MET compliant and I   LONG TERM GOALS: Target date: 09/14/2022 extended through 12/08/2022  Patient will be independent with advanced/ongoing HEP to improve outcomes and carryover.  Baseline:  Goal status: IN PROGRESS 11/24/22 Met for current.    2.  Patient will report at least  50% improvement in left knee pain to improve QOL. Baseline: sharp pain with all knee movement Goal status: MET 10/17/22- improvement but slightly worse pain today 11/24/22- at times still painful and locks, but overall 50% improvement overall.    3.  Patient will report 75% improvement in L hip pain to improve QOL. Baseline: constant severe pain Goal status: IN PROGRESS  09/15/2022 MET L hip significant improvement in pain.   11/24/22- started having pain 2 weeks ago in L hip again.   4. Patient will be able to ascend/descend 1 flight of stairs with 1 HR without increased L hip pain to access home.  Baseline: L hip pain increases on stairs to point interferes with sleep Goal status: IN PROGRESS- 10/17/22 does well with stairs with no pain 11/24/22 - doing ok on stairs, being careful due to falls, pain significantly less, if L knee painful compensates at hip which causes L hip pain.   5.  Patient will report at least 9 points improvement on LEFS to demonstrate improved functional ability. Baseline: 16/80 Goal status: MET - 09/27/22 LEFS: 32 / 80 = 40.0 %  PLAN:  PT FREQUENCY: 1x/week  PT DURATION: 12 weeks for 10 visits  PLANNED INTERVENTIONS: Therapeutic exercises, Therapeutic activity, Neuromuscular re-education, Balance training, Gait training, Patient/Family education, Self Care, Joint mobilization, Joint manipulation, Stair training, Orthotic/Fit training, Dry Needling, Electrical stimulation, Spinal manipulation, Spinal mobilization, Cryotherapy, Moist heat, scar mobilization, Taping, Traction, Ultrasound, Ionotophoresis 4mg /ml Dexamethasone, Manual therapy, and Re-evaluation  PLAN FOR NEXT SESSION:  30 day hold   Andrew Banks, PTA 12/01/2022, 5:12 PM

## 2023-03-03 ENCOUNTER — Other Ambulatory Visit (HOSPITAL_BASED_OUTPATIENT_CLINIC_OR_DEPARTMENT_OTHER): Payer: Self-pay | Admitting: Internal Medicine

## 2023-03-03 DIAGNOSIS — K7469 Other cirrhosis of liver: Secondary | ICD-10-CM

## 2023-03-23 ENCOUNTER — Telehealth (HOSPITAL_BASED_OUTPATIENT_CLINIC_OR_DEPARTMENT_OTHER): Payer: Self-pay

## 2023-04-06 ENCOUNTER — Ambulatory Visit (HOSPITAL_BASED_OUTPATIENT_CLINIC_OR_DEPARTMENT_OTHER)
Admission: RE | Admit: 2023-04-06 | Discharge: 2023-04-06 | Disposition: A | Discharge status: Home / Self Care | Payer: No Typology Code available for payment source | Source: Ambulatory Visit | Attending: Internal Medicine | Admitting: Internal Medicine

## 2023-04-06 ENCOUNTER — Other Ambulatory Visit (HOSPITAL_BASED_OUTPATIENT_CLINIC_OR_DEPARTMENT_OTHER): Payer: Self-pay | Admitting: Internal Medicine

## 2023-04-06 DIAGNOSIS — K7469 Other cirrhosis of liver: Secondary | ICD-10-CM | POA: Diagnosis present

## 2023-04-06 MED ORDER — IOHEXOL 300 MG/ML  SOLN
100.0000 mL | Freq: Once | INTRAMUSCULAR | Status: AC | PRN
  Administered 2023-04-06: 100 mL via INTRAVENOUS
